# Patient Record
Sex: Male | Born: 1960 | Race: White | Hispanic: No | Marital: Married | Smoking: Former smoker
Health system: Southern US, Community
[De-identification: ages and names within clinical notes are randomized; demographics above are authoritative.]

## PROBLEM LIST (undated history)

## (undated) DIAGNOSIS — F329 Major depressive disorder, single episode, unspecified: Secondary | ICD-10-CM

## (undated) DIAGNOSIS — I4891 Unspecified atrial fibrillation: Secondary | ICD-10-CM

## (undated) DIAGNOSIS — F419 Anxiety disorder, unspecified: Secondary | ICD-10-CM

## (undated) DIAGNOSIS — I10 Essential (primary) hypertension: Secondary | ICD-10-CM

## (undated) DIAGNOSIS — F32A Depression, unspecified: Secondary | ICD-10-CM

## (undated) DIAGNOSIS — I480 Paroxysmal atrial fibrillation: Secondary | ICD-10-CM

## (undated) HISTORY — DX: Paroxysmal atrial fibrillation: I48.0

## (undated) HISTORY — PX: WISDOM TOOTH EXTRACTION: SHX21

## (undated) HISTORY — DX: Depression, unspecified: F32.A

## (undated) HISTORY — DX: Major depressive disorder, single episode, unspecified: F32.9

## (undated) HISTORY — PX: CARDIAC ELECTROPHYSIOLOGY MAPPING AND ABLATION: SHX1292

## (undated) HISTORY — PX: TONSILLECTOMY: SUR1361

## (undated) HISTORY — DX: Anxiety disorder, unspecified: F41.9

## (undated) HISTORY — PX: CARPAL TUNNEL RELEASE: SHX101

## (undated) HISTORY — PX: ULNAR NERVE REPAIR: SHX2594

---

## 2001-08-17 ENCOUNTER — Emergency Department (HOSPITAL_COMMUNITY): Admission: EM | Admit: 2001-08-17 | Discharge: 2001-08-18 | Payer: Self-pay | Admitting: Emergency Medicine

## 2009-04-26 ENCOUNTER — Emergency Department (HOSPITAL_COMMUNITY): Admission: EM | Admit: 2009-04-26 | Discharge: 2009-04-26 | Payer: Self-pay | Admitting: Emergency Medicine

## 2011-11-26 HISTORY — PX: COLONOSCOPY: SHX174

## 2012-05-21 ENCOUNTER — Other Ambulatory Visit: Payer: Self-pay | Admitting: Orthopedic Surgery

## 2012-05-25 NOTE — Discharge Instructions (Signed)

## 2012-05-25 NOTE — H&P (Signed)
  Jason Fleming is an 51 y.o. male.   Chief Complaint: c/o cystic mass left palm. 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  He presents for evaluation of a 1.5 mm mass in his left palm which has the appearance of an epidermal inclusion cyst.   No past medical history on file.  No past surgical history on file.  No family history on file. Social History:  does not have a smoking history on file. He does not have any smokeless tobacco history on file. His alcohol and drug histories not on file.  Allergies: Allergies not on file  No prescriptions prior to admission    No results found for this or any previous visit (from the past 48 hour(s)).  No results found.   Pertinent items are noted in HPI.  There were no vitals taken for this visit.  General appearance: alert Head: Normocephalic, without obvious abnormality Neck: supple, symmetrical, trachea midline Resp: clear to auscultation bilaterally Cardio: regular rate and rhythm Extremities: Inspection of his hand reveals a 1.5 mm mass consistent with an epidermal inclusion cyst. This is somewhat tender. He has full ROM of his fingers in flexion/extension. There is no sign of STS. There is no evidence of recent trauma. His surgical incisions are well healed.   Pulses: 2+ and symmetric Skin: normal Neurologic: Grossly normal    Assessment/Plan Assessment: Probable epidermal inclusion cyst left palm.  Plan: We will schedule excision under local anesthesia at a mutually convenient time. The surgery, after care, risks and benefits were described in detail. Questions regarding the surgery were invited and answered in detail.   Jason Fleming,Jason Fleming J 05/25/2012, 9:01 PM    H&P documentation: 05/26/2012  -History and Physical Reviewed  -Patient has been re-examined  -No change in the plan of care  Wyn Forster, MD

## 2012-05-26 ENCOUNTER — Encounter (HOSPITAL_BASED_OUTPATIENT_CLINIC_OR_DEPARTMENT_OTHER): Payer: Self-pay | Admitting: Orthopedic Surgery

## 2012-05-26 ENCOUNTER — Ambulatory Visit (HOSPITAL_BASED_OUTPATIENT_CLINIC_OR_DEPARTMENT_OTHER)
Admission: RE | Admit: 2012-05-26 | Discharge: 2012-05-26 | Disposition: A | Discharge status: Home / Self Care | Payer: 59 | Source: Ambulatory Visit | Attending: Orthopedic Surgery | Admitting: Orthopedic Surgery

## 2012-05-26 ENCOUNTER — Encounter (HOSPITAL_BASED_OUTPATIENT_CLINIC_OR_DEPARTMENT_OTHER)
Admission: RE | Disposition: A | Discharge status: Home / Self Care | Payer: Self-pay | Source: Ambulatory Visit | Attending: Orthopedic Surgery

## 2012-05-26 DIAGNOSIS — L723 Sebaceous cyst: Secondary | ICD-10-CM | POA: Insufficient documentation

## 2012-05-26 HISTORY — PX: MASS EXCISION: SHX2000

## 2012-05-26 SURGERY — MINOR EXCISION OF MASS
Anesthesia: LOCAL | Site: Hand | Laterality: Left | Wound class: Clean

## 2012-05-26 MED ORDER — LIDOCAINE HCL 2 % IJ SOLN
INTRAMUSCULAR | Status: DC | PRN
  Administered 2012-05-26: 5 mL

## 2012-05-26 MED ORDER — HYDROCODONE-ACETAMINOPHEN 5-325 MG PO TABS: ORAL_TABLET | ORAL | Status: AC

## 2012-05-26 MED ORDER — CHLORHEXIDINE GLUCONATE 4 % EX LIQD: 60.0000 mL | Freq: Once | CUTANEOUS | Status: DC

## 2012-05-26 SURGICAL SUPPLY — 38 items
BANDAGE ADHESIVE 1X3 (GAUZE/BANDAGES/DRESSINGS) IMPLANT
BLADE SURG 15 STRL LF DISP TIS (BLADE) ×1 IMPLANT
BLADE SURG 15 STRL SS (BLADE) ×2
BNDG CMPR 9X4 STRL LF SNTH (GAUZE/BANDAGES/DRESSINGS)
BNDG CMPR MD 5X2 ELC HKLP STRL (GAUZE/BANDAGES/DRESSINGS)
BNDG COHESIVE 1X5 TAN STRL LF (GAUZE/BANDAGES/DRESSINGS) ×1 IMPLANT
BNDG ELASTIC 2 VLCR STRL LF (GAUZE/BANDAGES/DRESSINGS) IMPLANT
BNDG ESMARK 4X9 LF (GAUZE/BANDAGES/DRESSINGS) IMPLANT
BRUSH SCRUB EZ PLAIN DRY (MISCELLANEOUS) ×2 IMPLANT
CLOTH BEACON ORANGE TIMEOUT ST (SAFETY) ×2 IMPLANT
CORDS BIPOLAR (ELECTRODE) IMPLANT
COVER MAYO STAND STRL (DRAPES) ×2 IMPLANT
CUFF TOURNIQUET SINGLE 18IN (TOURNIQUET CUFF) ×1 IMPLANT
DECANTER SPIKE VIAL GLASS SM (MISCELLANEOUS) IMPLANT
DRAIN PENROSE 1/2X12 LTX STRL (WOUND CARE) IMPLANT
DRAPE SURG 17X23 STRL (DRAPES) ×1 IMPLANT
GAUZE SPONGE 4X4 12PLY STRL LF (GAUZE/BANDAGES/DRESSINGS) ×2 IMPLANT
GAUZE XEROFORM 1X8 LF (GAUZE/BANDAGES/DRESSINGS) ×1 IMPLANT
GLOVE BIO SURGEON STRL SZ7 (GLOVE) ×1 IMPLANT
GLOVE BIOGEL M STRL SZ7.5 (GLOVE) ×2 IMPLANT
GLOVE ORTHO TXT STRL SZ7.5 (GLOVE) ×2 IMPLANT
GOWN PREVENTION PLUS XLARGE (GOWN DISPOSABLE) ×2 IMPLANT
NEEDLE 27GAX1X1/2 (NEEDLE) ×2 IMPLANT
PACK BASIN DAY SURGERY FS (CUSTOM PROCEDURE TRAY) ×2 IMPLANT
PADDING CAST ABS 4INX4YD NS (CAST SUPPLIES)
PADDING CAST ABS COTTON 4X4 ST (CAST SUPPLIES) ×1 IMPLANT
SPONGE GAUZE 4X4 12PLY (GAUZE/BANDAGES/DRESSINGS) ×1 IMPLANT
STOCKINETTE 4X48 STRL (DRAPES) ×2 IMPLANT
SUT ETHILON 5 0 P 3 18 (SUTURE)
SUT NYLON ETHILON 5-0 P-3 1X18 (SUTURE) ×1 IMPLANT
SUT PROLENE 3 0 PS 2 (SUTURE) ×1 IMPLANT
SYR 3ML 23GX1 SAFETY (SYRINGE) IMPLANT
SYR BULB 3OZ (MISCELLANEOUS) ×1 IMPLANT
SYR CONTROL 10ML LL (SYRINGE) ×2 IMPLANT
TOWEL OR 17X24 6PK STRL BLUE (TOWEL DISPOSABLE) ×3 IMPLANT
TRAY DSU PREP LF (CUSTOM PROCEDURE TRAY) ×2 IMPLANT
UNDERPAD 30X30 INCONTINENT (UNDERPADS AND DIAPERS) ×2 IMPLANT
WATER STERILE IRR 1000ML POUR (IV SOLUTION) ×1 IMPLANT

## 2012-05-26 NOTE — Op Note (Signed)
[id number] 

## 2012-05-26 NOTE — Brief Op Note (Signed)
05/26/2012  12:09 PM  PATIENT:  Jason Fleming  51 y.o. male  PRE-OPERATIVE DIAGNOSIS:  mass left palm  POST-OPERATIVE DIAGNOSIS:  epidermal inclusion cyst  PROCEDURE: EXCISION OF EPIDERMAL INCLUSION CYST LEFT PALM  SURGEON: Wyn Forster., MD   PHYSICIAN ASSISTANT:   ASSISTANTS:Tremayne Sheldon Dasnoit,P.A-C   ANESTHESIA:   local  EBL:     BLOOD ADMINISTERED:none  DRAINS: none   LOCAL MEDICATIONS USED:  XYLOCAINE   SPECIMEN:  No Specimen  DISPOSITION OF SPECIMEN:  N/A  COUNTS:  YES  TOURNIQUET:   Total Tourniquet Time Documented: Upper Arm (Left) - 10 minutes  DICTATION: .Other Dictation: Dictation Number [id number]  PLAN OF CARE: Discharge to home after PACU  PATIENT DISPOSITION:  PACU - hemodynamically stable.

## 2012-05-27 NOTE — Op Note (Signed)
[name], [name]                ACCOUNT NO.:  [id number]  MEDICAL RECORD NO.:  [id number]  LOCATION:                                 FACILITY:  PHYSICIAN:  Katy Fitch. Raydon Chappuis, M.D. DATE OF BIRTH:  1960-12-19  DATE OF PROCEDURE:  05/26/2012 DATE OF DISCHARGE:                              OPERATIVE REPORT   PREOPERATIVE DIAGNOSIS:  Enlarging mass, left proximal palm, consistent with a probable epidermal inclusion cyst.  POSTOPERATIVE DIAGNOSIS:  Enlarging mass, left proximal palm, consistent with a probable epidermal inclusion cyst; confirming the presence of an epidermal inclusion cyst.  OPERATION:  Resection of subcutaneous epidermal inclusion cyst.  OPERATING SURGEON:  Katy Fitch. Takayla Baillie, MD.  ASSISTANT:  Marveen Reeks. Dasnoit, PA-C.  ANESTHESIA:  Lidocaine 2% palmar block.  This was performed as a minor operating room procedure.  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  He has had an enlarging mass in his palm for more than a year.  This is not particularly tender, but it is quite sizable, approaching 2 cm in diameter.  He presented for evaluation and requested removal.  We advised him this was likely an epidermal inclusion cyst.  Epidermal inclusion cysts can recur.  It is possible to have recurrence with little as one epidermal cell remaining.  We advised him that we perform marginal resection with through irrigation of his wound.  After informed consent, he was brought to the operating room at this time.  PROCEDURE:  Jason Fleming was brought to room 1 of the [facility] and placed in supine position on the operating table.  Following Betadine prep of his palm and informed consent, 2% lidocaine was infiltrated deep to the cyst and in the region of the cutaneous nerves serving the palm.  After 10 minutes, excellent anesthesia was achieved.  The left hand and arm were then prepped with  Betadine soap and solution, sterilely draped. A pneumatic tourniquet was applied to proximal left brachium.  Following exsanguination of the left arm by direct compression, the arterial tourniquet was inflated to 220 mmHg.  Following routine surgical time- out, we performed an elliptical resection of expanded skin overlying the cyst.  We then performed a meticulous dissection surrounding the membrane of the epidermal cyst.  There was a small area of leakage where the preoperative anesthetic 27-gauge needle had likely pierced deep portion of the cyst.  This was meticulously dissected with the margin of normal subcutaneous fat.  After the cyst was excised in toto, it was passed off, but will not be sent to Pathology as this is a classic epidermal inclusion cyst.  The wound was then thoroughly lavaged with sterile saline, followed by repair of the skin with intradermal 3-0 Prolene suture.  A compressive dressings was applied with a Xeroflo directly on the wound followed by sterile gauze and Ace wrap.  We advised [name] to remove his dressing in 3 days and begin using Band-Aids or other bandages at home.  We will see him back for follow up in 1 week for suture removal or sooner p.r.n. problems.     Katy Fitch Jakelyn Squyres, M.D.  RVS/MEDQ  D:  05/26/2012  T:  05/26/2012  Job:  [id number]

## 2012-05-29 ENCOUNTER — Encounter (HOSPITAL_BASED_OUTPATIENT_CLINIC_OR_DEPARTMENT_OTHER): Payer: Self-pay | Admitting: Orthopedic Surgery

## 2012-07-02 ENCOUNTER — Emergency Department (HOSPITAL_COMMUNITY)
Admission: EM | Admit: 2012-07-02 | Discharge: 2012-07-02 | Disposition: A | Discharge status: Home / Self Care | Payer: 59 | Attending: Emergency Medicine | Admitting: Emergency Medicine

## 2012-07-02 ENCOUNTER — Encounter (HOSPITAL_COMMUNITY): Payer: Self-pay | Admitting: Vascular Surgery

## 2012-07-02 ENCOUNTER — Other Ambulatory Visit: Payer: Self-pay

## 2012-07-02 DIAGNOSIS — I10 Essential (primary) hypertension: Secondary | ICD-10-CM | POA: Insufficient documentation

## 2012-07-02 DIAGNOSIS — F410 Panic disorder [episodic paroxysmal anxiety] without agoraphobia: Secondary | ICD-10-CM | POA: Insufficient documentation

## 2012-07-02 DIAGNOSIS — Z87891 Personal history of nicotine dependence: Secondary | ICD-10-CM | POA: Insufficient documentation

## 2012-07-02 HISTORY — DX: Essential (primary) hypertension: I10

## 2012-07-02 LAB — POCT I-STAT, CHEM 8
BUN: 8 mg/dL (ref 6–23)
Calcium, Ion: 1.25 mmol/L — ABNORMAL HIGH (ref 1.12–1.23)
Creatinine, Ser: 1 mg/dL (ref 0.50–1.35)
Hemoglobin: 14.3 g/dL (ref 13.0–17.0)
TCO2: 25 mmol/L (ref 0–100)

## 2012-07-02 MED ORDER — ALPRAZOLAM 0.5 MG PO TABS: 0.5000 mg | ORAL_TABLET | Freq: Every evening | ORAL | Status: AC | PRN

## 2012-07-02 NOTE — ED Notes (Signed)
MD at bedside. 

## 2012-07-02 NOTE — ED Notes (Signed)
Pt verbalizes understanding 

## 2012-07-02 NOTE — ED Provider Notes (Signed)
History     CSN: [id number]  Arrival date & time 07/02/12  1438   First MD Initiated Contact with Patient 07/02/12 1605      Chief Complaint  Patient presents with  . Tingling    (Consider location/radiation/quality/duration/timing/severity/associated sxs/prior treatment) HPI Pt with history of anxiety and had been taking zoloft but stopped in march because he was feeling better. Pt state he has had increased stress at work and developed panic attack around 1300 today characterized by feeling hot, hyperventilation, and tingling over entire body. No CP or SOB. Pt took xanax and symptoms now have abated completely. Pt took last xanax. No PE risk factors. BP noted to be elevated at presentation Past Medical History  Diagnosis Date  . Hypertension     Past Surgical History  Procedure Date  . Mass excision 05/26/2012    Procedure: MINOR EXCISION OF MASS;  Surgeon: Wyn Forster., MD;  Location: [facility];  Service: Orthopedics;  Laterality: Left;  . Tonsillectomy   . Carpal tunnel release   . Wisdom tooth extraction     History reviewed. No pertinent family history.  History  Substance Use Topics  . Smoking status: Former Smoker    Types: Cigarettes    Quit date: 03/02/2012  . Smokeless tobacco: Not on file  . Alcohol Use: No      Review of Systems  Constitutional: Positive for diaphoresis. Negative for fever and chills.  HENT: Negative for neck pain and neck stiffness.   Respiratory: Negative for cough, shortness of breath and wheezing.   Cardiovascular: Positive for palpitations. Negative for chest pain and leg swelling.  Gastrointestinal: Negative for nausea and vomiting.  Skin: Negative for rash.  Neurological: Positive for dizziness and light-headedness. Negative for seizures, weakness and headaches.  Psychiatric/Behavioral: Negative for dysphoric mood. The patient is nervous/anxious.     Allergies  Review of patient's allergies indicates no  known allergies.  Home Medications   Current Outpatient Rx  Name Route Sig Dispense Refill  . ALPRAZOLAM 0.5 MG PO TABS Oral Take 0.5 mg by mouth 3 (three) times daily as needed. For anxiety.    . ASPIRIN EC 81 MG PO TBEC Oral Take 81 mg by mouth daily.    . OMEGA-3 FATTY ACIDS 1000 MG PO CAPS Oral Take 3 g by mouth 2 (two) times daily.     . ADULT MULTIVITAMIN W/MINERALS CH Oral Take 1 tablet by mouth daily.    . SERTRALINE HCL 50 MG PO TABS Oral Take 50 mg by mouth daily.    Marland Kitchen ALPRAZOLAM 0.5 MG PO TABS Oral Take 1 tablet (0.5 mg total) by mouth at bedtime as needed for sleep. 15 tablet 0    BP 129/92  Pulse 53  Temp 99.1 F (37.3 C) (Oral)  Resp 16  Ht 6\' 2"  (1.88 m)  Wt 240 lb (108.863 kg)  BMI 30.81 kg/m2  SpO2 98%  Physical Exam  Nursing note and vitals reviewed. Constitutional: He is oriented to person, place, and time. He appears well-developed and well-nourished. No distress.  HENT:  Head: Normocephalic and atraumatic.  Mouth/Throat: Oropharynx is clear and moist.  Eyes: EOM are normal. Pupils are equal, round, and reactive to light.  Neck: Normal range of motion. Neck supple.  Cardiovascular: Normal rate and regular rhythm.  Exam reveals no gallop and no friction rub.   No murmur heard. Pulmonary/Chest: Effort normal and breath sounds normal. No respiratory distress. He has no wheezes. He has no  rales. He exhibits no tenderness.  Abdominal: Soft. Bowel sounds are normal. He exhibits no distension and no mass. There is no tenderness. There is no rebound and no guarding.  Musculoskeletal: Normal range of motion. He exhibits no edema and no tenderness.       No calf tenderness or swelling  Neurological: He is alert and oriented to person, place, and time.       5/5 motor, sensation intact  Skin: Skin is warm and dry. No rash noted. No erythema.  Psychiatric: He has a normal mood and affect. His behavior is normal.    ED Course  Procedures (including critical care  time)  Labs Reviewed  POCT I-STAT, CHEM 8 - Abnormal; Notable for the following:    Calcium, Ion 1.25 (*)     All other components within normal limits  POCT I-STAT TROPONIN I  LAB REPORT - SCANNED   No results found.   1. Panic attack      Date: 07/02/2012  Rate: 97  Rhythm: normal sinus rhythm  QRS Axis: normal  Intervals: normal  ST/T Wave abnormalities: normal  Conduction Disutrbances:none  Narrative Interpretation:   Old EKG Reviewed: none available    MDM          Loren Racer, MD 07/13/12 [phone]

## 2012-07-02 NOTE — ED Notes (Addendum)
Pt reports tingling in the facial region 2weeks with worsening over the past 3days.  Denies h/a, blurred vision, chest pain, or SOB.  Pt has hx of anxiety and reports having this problem before due to anxiety, but it wasn't this severe. No hx of cardiac issues.

## 2012-07-02 NOTE — ED Notes (Signed)
EKG done in triage upon pt arrival

## 2012-07-02 NOTE — ED Notes (Signed)
Pt reports he is feeling tingling in his face, ears, top of his head, and his arms and legs. States that he has had panic attacks so this may be what is happening but he has never had this type of tingling associated with the attacks.

## 2013-02-10 ENCOUNTER — Telehealth: Payer: Self-pay | Admitting: Family Medicine

## 2013-02-10 MED ORDER — SERTRALINE HCL 50 MG PO TABS: 50.0000 mg | ORAL_TABLET | Freq: Every day | ORAL | Status: DC

## 2013-02-10 NOTE — Telephone Encounter (Signed)
Medication refilled per protocol.Patient needs to be seen before any further refills 

## 2013-03-10 ENCOUNTER — Telehealth: Payer: Self-pay | Admitting: Family Medicine

## 2013-03-10 MED ORDER — SERTRALINE HCL 50 MG PO TABS: ORAL_TABLET | ORAL | Status: DC

## 2013-03-10 NOTE — Telephone Encounter (Signed)
Rx Refilled  

## 2013-05-11 ENCOUNTER — Other Ambulatory Visit: Payer: Self-pay | Admitting: Family Medicine

## 2013-05-11 ENCOUNTER — Ambulatory Visit (INDEPENDENT_AMBULATORY_CARE_PROVIDER_SITE_OTHER): Payer: 59 | Admitting: Family Medicine

## 2013-05-11 ENCOUNTER — Encounter: Payer: Self-pay | Admitting: Family Medicine

## 2013-05-11 VITALS — BP 110/80 | HR 78 | Temp 98.1°F | Resp 18 | Wt 256.0 lb

## 2013-05-11 DIAGNOSIS — F419 Anxiety disorder, unspecified: Secondary | ICD-10-CM | POA: Insufficient documentation

## 2013-05-11 DIAGNOSIS — Z13220 Encounter for screening for lipoid disorders: Secondary | ICD-10-CM

## 2013-05-11 DIAGNOSIS — Z125 Encounter for screening for malignant neoplasm of prostate: Secondary | ICD-10-CM

## 2013-05-11 DIAGNOSIS — F329 Major depressive disorder, single episode, unspecified: Secondary | ICD-10-CM

## 2013-05-11 DIAGNOSIS — F32A Depression, unspecified: Secondary | ICD-10-CM | POA: Insufficient documentation

## 2013-05-11 MED ORDER — SERTRALINE HCL 50 MG PO TABS: 100.0000 mg | ORAL_TABLET | Freq: Every day | ORAL | Status: DC

## 2013-05-11 NOTE — Telephone Encounter (Signed)
Med refilled.

## 2013-05-11 NOTE — Progress Notes (Signed)
  Subjective:    Patient ID: Jason Fleming, male    DOB: 04-30-1961, 52 y.o.   MRN: [id number]  HPI  Patient is here for followup of his depression and anxiety. He is currently taking Zoloft 100 mg by mouth daily. He states he has never felt better. His anxiety is well controlled. He denies any panic attacks. He denies any depression. He denies any anhedonia or suicidal thoughts. His blood pressure is currently well controlled. He would like to continue the Zoloft. He tried to stop the medicine twice in the past and had resumed both occasions due to increasing depression and anxiety.  His colonoscopy is up to date. He is due for a prostate exam but he declines the rectal exam. Past Medical History  Diagnosis Date  . Hypertension   . Depression   . Anxiety    Current Outpatient Prescriptions on File Prior to Visit  Medication Sig Dispense Refill  . fish oil-omega-3 fatty acids 1000 MG capsule Take 3 g by mouth 2 (two) times daily.       . Multiple Vitamin (MULTIVITAMIN WITH MINERALS) TABS Take 1 tablet by mouth daily.      Marland Kitchen aspirin EC 81 MG tablet Take 81 mg by mouth daily.       No current facility-administered medications on file prior to visit.   No Known Allergies History   Social History  . Marital Status: Married    Spouse Name: N/A    Number of Children: N/A  . Years of Education: N/A   Occupational History  . Not on file.   Social History Main Topics  . Smoking status: Former Smoker    Types: Cigarettes    Quit date: 03/02/2012  . Smokeless tobacco: Not on file  . Alcohol Use: No  . Drug Use: No  . Sexually Active:    Other Topics Concern  . Not on file   Social History Narrative  . No narrative on file     Review of Systems  All other systems reviewed and are negative.       Objective:   Physical Exam  Vitals reviewed. Neck: Neck supple. No thyromegaly present.  Cardiovascular: Normal rate, regular rhythm, normal heart sounds and intact distal  pulses.   No murmur heard. Pulmonary/Chest: Effort normal and breath sounds normal. No respiratory distress. He has no wheezes. He has no rales.  Abdominal: Soft. Bowel sounds are normal. He exhibits no distension. There is no tenderness. There is no rebound and no guarding.  Lymphadenopathy:    He has no cervical adenopathy.  Psychiatric: He has a normal mood and affect. His behavior is normal. Judgment and thought content normal.          Assessment & Plan:  1. Depression Continue Zoloft 100 mg by mouth daily.. - sertraline (ZOLOFT) 50 MG tablet; Take 2 tablets (100 mg total) by mouth daily.  Dispense: 60 tablet; Refill: 11  2. Screening for cholesterol level Return fasting for a CMP CBC and fasting lipid panel. Your LDL is less than 130 - CBC with Differential; Future - COMPLETE METABOLIC PANEL WITH GFR; Future - Lipid panel; Future - PSA; Future  3. Screening for prostate cancer Return for a PSA.

## 2014-05-19 ENCOUNTER — Other Ambulatory Visit: Payer: Self-pay | Admitting: Family Medicine

## 2014-05-19 NOTE — Telephone Encounter (Signed)
Medication filled x1 with no refills.   Requires office visit before any further refills can be given.   Letter sent.  

## 2014-07-19 ENCOUNTER — Other Ambulatory Visit: Payer: Self-pay | Admitting: Family Medicine

## 2014-07-19 ENCOUNTER — Encounter: Payer: Self-pay | Admitting: Family Medicine

## 2014-07-19 ENCOUNTER — Ambulatory Visit (INDEPENDENT_AMBULATORY_CARE_PROVIDER_SITE_OTHER): Payer: BC Managed Care – PPO | Admitting: Family Medicine

## 2014-07-19 VITALS — BP 102/64 | HR 60 | Temp 98.3°F | Resp 16 | Ht 74.0 in | Wt 238.0 lb

## 2014-07-19 DIAGNOSIS — Z125 Encounter for screening for malignant neoplasm of prostate: Secondary | ICD-10-CM

## 2014-07-19 DIAGNOSIS — Z13220 Encounter for screening for lipoid disorders: Secondary | ICD-10-CM

## 2014-07-19 DIAGNOSIS — F411 Generalized anxiety disorder: Secondary | ICD-10-CM

## 2014-07-19 LAB — COMPLETE METABOLIC PANEL WITH GFR
ALT: 19 U/L (ref 0–53)
AST: 19 U/L (ref 0–37)
Albumin: 4.7 g/dL (ref 3.5–5.2)
Alkaline Phosphatase: 51 U/L (ref 39–117)
BUN: 20 mg/dL (ref 6–23)
CALCIUM: 9 mg/dL (ref 8.4–10.5)
CHLORIDE: 105 meq/L (ref 96–112)
CO2: 25 meq/L (ref 19–32)
Creat: 1.19 mg/dL (ref 0.50–1.35)
GFR, EST AFRICAN AMERICAN: 81 mL/min
GFR, Est Non African American: 70 mL/min
Glucose, Bld: 93 mg/dL (ref 70–99)
POTASSIUM: 4.3 meq/L (ref 3.5–5.3)
SODIUM: 140 meq/L (ref 135–145)
TOTAL PROTEIN: 7 g/dL (ref 6.0–8.3)
Total Bilirubin: 0.5 mg/dL (ref 0.2–1.2)

## 2014-07-19 LAB — LIPID PANEL
Cholesterol: 200 mg/dL (ref 0–200)
HDL: 46 mg/dL (ref >39)
LDL CALC: 123 mg/dL — AB (ref 0–99)
Total CHOL/HDL Ratio: 4.3 Ratio
Triglycerides: 156 mg/dL — ABNORMAL HIGH (ref <150)
VLDL: 31 mg/dL (ref 0–40)

## 2014-07-19 LAB — CBC WITH DIFFERENTIAL/PLATELET
BASOS ABS: 0.1 10*3/uL (ref 0.0–0.1)
Basophils Relative: 2 % — ABNORMAL HIGH (ref 0–1)
Eosinophils Absolute: 0.3 10*3/uL (ref 0.0–0.7)
Eosinophils Relative: 5 % (ref 0–5)
HCT: 36.9 % — ABNORMAL LOW (ref 39.0–52.0)
Hemoglobin: 12.6 g/dL — ABNORMAL LOW (ref 13.0–17.0)
LYMPHS ABS: 2.1 10*3/uL (ref 0.7–4.0)
LYMPHS PCT: 41 % (ref 12–46)
MCH: 27.8 pg (ref 26.0–34.0)
MCHC: 34.1 g/dL (ref 30.0–36.0)
MCV: 81.3 fL (ref 78.0–100.0)
Monocytes Absolute: 0.4 10*3/uL (ref 0.1–1.0)
Monocytes Relative: 8 % (ref 3–12)
NEUTROS ABS: 2.3 10*3/uL (ref 1.7–7.7)
NEUTROS PCT: 44 % (ref 43–77)
PLATELETS: 198 10*3/uL (ref 150–400)
RBC: 4.54 MIL/uL (ref 4.22–5.81)
RDW: 14.9 % (ref 11.5–15.5)
WBC: 5.2 10*3/uL (ref 4.0–10.5)

## 2014-07-19 MED ORDER — VENLAFAXINE HCL ER 75 MG PO CP24: 150.0000 mg | ORAL_CAPSULE | Freq: Every day | ORAL | Status: DC

## 2014-07-19 NOTE — Progress Notes (Signed)
   Subjective:    Patient ID: Jason Fleming, male    DOB: Oct 31, 1961, 53 y.o.   MRN: [id number]  HPI Patient is here today for generalized anxiety disorder. He is currently taking 100 mg Zoloft every night. The medication helps but he still has breakthrough anxiety attacks almost on a daily basis. They're not full-blown panic attacks but they are periods with moderate unprovoked anxiety.  He believes he may becoming tolerant to the medicine. He is also overdue for fasting lab work. He declines digital rectal exam today but he would consent for a PSA. Past Medical History  Diagnosis Date  . Hypertension   . Depression   . Anxiety    Past Surgical History  Procedure Laterality Date  . Mass excision  05/26/2012    Procedure: MINOR EXCISION OF MASS;  Surgeon: Cammie Sickle., MD;  Location: [facility];  Service: Orthopedics;  Laterality: Left;  . Tonsillectomy    . Carpal tunnel release    . Wisdom tooth extraction     Current Outpatient Prescriptions on File Prior to Visit  Medication Sig Dispense Refill  . aspirin EC 81 MG tablet Take 81 mg by mouth daily.      . fish oil-omega-3 fatty acids 1000 MG capsule Take 3 g by mouth 2 (two) times daily.       . Multiple Vitamin (MULTIVITAMIN WITH MINERALS) TABS Take 1 tablet by mouth daily.      . sertraline (ZOLOFT) 50 MG tablet TAKE 2 TABLETS (100 MG TOTAL) BY MOUTH DAILY.  60 tablet  0   No current facility-administered medications on file prior to visit.   No Known Allergies    Review of Systems  All other systems reviewed and are negative.      Objective:   Physical Exam  Neck: No JVD present. No thyromegaly present.  Cardiovascular: Normal rate, regular rhythm and normal heart sounds.   No murmur heard. Pulmonary/Chest: Effort normal and breath sounds normal. No respiratory distress. He has no wheezes. He has no rales.  Abdominal: Soft. Bowel sounds are normal. He exhibits no distension. There is no  tenderness. There is no rebound and no guarding.  Lymphadenopathy:    He has no cervical adenopathy.          Assessment & Plan:  GAD (generalized anxiety disorder) - Plan: venlafaxine XR (EFFEXOR-XR) 75 MG 24 hr capsule  Screening cholesterol level - Plan: CBC with Differential, COMPLETE METABOLIC PANEL WITH GFR, Lipid panel  Prostate cancer screening - Plan: PSA   Decrease Zoloft to 50 mg by mouth daily for 2 weeks. Begin Effexor XR 70 mg by mouth every morning for 2 weeks. After 2 weeks discontinue Zoloft and increase Effexor to 150 mg by mouth every morning. Recheck in one month. Blood pressure is excellent. I will schedule fasting lab work. Also check a PSA.

## 2014-07-20 LAB — PSA: PSA: 0.34 ng/mL (ref <=4.00)

## 2014-07-22 LAB — VITAMIN B12: VITAMIN B 12: 499 pg/mL (ref 211–911)

## 2014-07-22 LAB — IRON: Iron: 86 ug/dL (ref 42–165)

## 2014-07-23 ENCOUNTER — Other Ambulatory Visit: Payer: Self-pay | Admitting: *Deleted

## 2014-07-23 DIAGNOSIS — D649 Anemia, unspecified: Secondary | ICD-10-CM

## 2015-02-02 ENCOUNTER — Emergency Department (HOSPITAL_COMMUNITY): Payer: BLUE CROSS/BLUE SHIELD

## 2015-02-02 ENCOUNTER — Encounter (HOSPITAL_COMMUNITY): Payer: Self-pay | Admitting: Emergency Medicine

## 2015-02-02 ENCOUNTER — Emergency Department (HOSPITAL_COMMUNITY)
Admission: EM | Admit: 2015-02-02 | Discharge: 2015-02-02 | Disposition: A | Discharge status: Home / Self Care | Payer: BLUE CROSS/BLUE SHIELD | Attending: Emergency Medicine | Admitting: Emergency Medicine

## 2015-02-02 DIAGNOSIS — S56521A Laceration of other extensor muscle, fascia and tendon at forearm level, right arm, initial encounter: Secondary | ICD-10-CM | POA: Insufficient documentation

## 2015-02-02 DIAGNOSIS — Y288XXA Contact with other sharp object, undetermined intent, initial encounter: Secondary | ICD-10-CM | POA: Insufficient documentation

## 2015-02-02 DIAGNOSIS — Y990 Civilian activity done for income or pay: Secondary | ICD-10-CM | POA: Diagnosis not present

## 2015-02-02 DIAGNOSIS — Z23 Encounter for immunization: Secondary | ICD-10-CM | POA: Diagnosis not present

## 2015-02-02 DIAGNOSIS — S66821A Laceration of other specified muscles, fascia and tendons at wrist and hand level, right hand, initial encounter: Secondary | ICD-10-CM

## 2015-02-02 DIAGNOSIS — Y9389 Activity, other specified: Secondary | ICD-10-CM | POA: Diagnosis not present

## 2015-02-02 DIAGNOSIS — S6991XA Unspecified injury of right wrist, hand and finger(s), initial encounter: Secondary | ICD-10-CM | POA: Diagnosis present

## 2015-02-02 DIAGNOSIS — S61401A Unspecified open wound of right hand, initial encounter: Secondary | ICD-10-CM

## 2015-02-02 DIAGNOSIS — Z79899 Other long term (current) drug therapy: Secondary | ICD-10-CM | POA: Diagnosis not present

## 2015-02-02 DIAGNOSIS — I10 Essential (primary) hypertension: Secondary | ICD-10-CM | POA: Insufficient documentation

## 2015-02-02 DIAGNOSIS — Z87891 Personal history of nicotine dependence: Secondary | ICD-10-CM | POA: Insufficient documentation

## 2015-02-02 DIAGNOSIS — Z8659 Personal history of other mental and behavioral disorders: Secondary | ICD-10-CM | POA: Insufficient documentation

## 2015-02-02 DIAGNOSIS — Z7982 Long term (current) use of aspirin: Secondary | ICD-10-CM | POA: Diagnosis not present

## 2015-02-02 DIAGNOSIS — Y9289 Other specified places as the place of occurrence of the external cause: Secondary | ICD-10-CM | POA: Diagnosis not present

## 2015-02-02 MED ORDER — LIDOCAINE HCL (PF) 1 % IJ SOLN
INTRAMUSCULAR | Status: AC
  Administered 2015-02-02: 5 mL
  Filled 2015-02-02: qty 5

## 2015-02-02 MED ORDER — LIDOCAINE HCL (PF) 1 % IJ SOLN
INTRAMUSCULAR | Status: AC
  Administered 2015-02-02: 21:00:00
  Filled 2015-02-02: qty 5

## 2015-02-02 MED ORDER — LIDOCAINE HCL (PF) 1 % IJ SOLN
INTRAMUSCULAR | Status: AC
  Administered 2015-02-02: 22:00:00
  Filled 2015-02-02: qty 5

## 2015-02-02 MED ORDER — DOXYCYCLINE HYCLATE 100 MG PO TABS
100.0000 mg | ORAL_TABLET | Freq: Once | ORAL | Status: AC
  Administered 2015-02-02: 100 mg via ORAL
  Filled 2015-02-02: qty 1

## 2015-02-02 MED ORDER — DEXTROSE 5 % IV SOLN
1.0000 g | Freq: Once | INTRAVENOUS | Status: DC
  Filled 2015-02-02: qty 10

## 2015-02-02 MED ORDER — BUPIVACAINE-EPINEPHRINE (PF) 0.5% -1:200000 IJ SOLN
INTRAMUSCULAR | Status: AC
  Administered 2015-02-02: 21:00:00
  Filled 2015-02-02: qty 30

## 2015-02-02 MED ORDER — TETANUS-DIPHTH-ACELL PERTUSSIS 5-2.5-18.5 LF-MCG/0.5 IM SUSP
0.5000 mL | Freq: Once | INTRAMUSCULAR | Status: AC
  Administered 2015-02-02: 0.5 mL via INTRAMUSCULAR
  Filled 2015-02-02: qty 0.5

## 2015-02-02 MED ORDER — HYDROCODONE-ACETAMINOPHEN 5-325 MG PO TABS: 1.0000 | ORAL_TABLET | ORAL | Status: DC | PRN

## 2015-02-02 MED ORDER — DOXYCYCLINE HYCLATE 100 MG PO CAPS: 100.0000 mg | ORAL_CAPSULE | Freq: Two times a day (BID) | ORAL | Status: DC

## 2015-02-02 MED ORDER — CEFTRIAXONE SODIUM 1 G IJ SOLR
1.0000 g | Freq: Once | INTRAMUSCULAR | Status: AC
  Administered 2015-02-02: 1 g via INTRAMUSCULAR
  Filled 2015-02-02: qty 10

## 2015-02-02 NOTE — ED Notes (Signed)
Placed pt's rt hand in Normal saline/betadine soak.

## 2015-02-02 NOTE — ED Notes (Signed)
Pt has laceration to the rt hand by metal, bleeding controlled at this time.

## 2015-02-02 NOTE — ED Provider Notes (Signed)
CSN: [id number]     Arrival date & time 02/02/15  1826 History   First MD Initiated Contact with Patient 02/02/15 2008     Chief Complaint  Patient presents with  . Laceration     (Consider location/radiation/quality/duration/timing/severity/associated sxs/prior Treatment) Patient is a 54 y.o. male presenting with skin laceration. The history is provided by the patient.  Laceration Location:  Hand Hand laceration location:  R hand Depth:  Through muscle Quality: avulsion and jagged   Time since incident:  4 hours Laceration mechanism:  Metal edge Pain details:    Severity:  Moderate   Timing:  Constant   Progression:  Unchanged Foreign body present:  No foreign bodies Relieved by:  Pressure Worsened by:  Movement Tetanus status:  Unknown  Jason Fleming is a 54 y.o. male who presents to the ED with a laceration to the right hand. He states he was working on a car and polishing a piece of metal and his hand slipped and went under the skin of the dorsum of the right hand. He denies any other injuries. He is unsure of his last tetanus.   Past Medical History  Diagnosis Date  . Hypertension   . Depression   . Anxiety    Past Surgical History  Procedure Laterality Date  . Mass excision  05/26/2012    Procedure: MINOR EXCISION OF MASS;  Surgeon: Cammie Sickle., MD;  Location: [place];  Service: Orthopedics;  Laterality: Left;  . Tonsillectomy    . Carpal tunnel release    . Wisdom tooth extraction     No family history on file. History  Substance Use Topics  . Smoking status: Former Smoker    Types: Cigarettes    Quit date: 03/02/2012  . Smokeless tobacco: Not on file  . Alcohol Use: No    Review of Systems Negative except as stated in HPI   Allergies  Review of patient's allergies indicates no known allergies.  Home Medications   Prior to Admission medications   Medication Sig Start Date End Date Taking? Authorizing Provider  aspirin EC  81 MG tablet Take 81 mg by mouth daily.    Historical Provider, MD  doxycycline (VIBRAMYCIN) 100 MG capsule Take 1 capsule (100 mg total) by mouth 2 (two) times daily. 02/02/15   Hope Bunnie Pion, NP  fish oil-omega-3 fatty acids 1000 MG capsule Take 3 g by mouth 2 (two) times daily.     Historical Provider, MD  HYDROcodone-acetaminophen (NORCO/VICODIN) 5-325 MG per tablet Take 1 tablet by mouth every 4 (four) hours as needed. 02/02/15   Hope Bunnie Pion, NP  Multiple Vitamin (MULTIVITAMIN WITH MINERALS) TABS Take 1 tablet by mouth daily.    Historical Provider, MD  sertraline (ZOLOFT) 50 MG tablet TAKE 2 TABLETS (100 MG TOTAL) BY MOUTH DAILY. 05/19/14   Susy Frizzle, MD  venlafaxine XR (EFFEXOR-XR) 75 MG 24 hr capsule Take 2 capsules (150 mg total) by mouth daily with breakfast. 07/19/14   Susy Frizzle, MD   BP 137/87 mmHg  Pulse 80  Temp(Src) 98.8 F (37.1 C) (Oral)  Resp 20  Ht 6\' 2"  (1.88 m)  Wt 240 lb (108.863 kg)  BMI 30.80 kg/m2  SpO2 99% Physical Exam  Constitutional: He is oriented to person, place, and time. He appears well-developed and well-nourished.  Eyes: EOM are normal.  Neck: Neck supple.  Pulmonary/Chest: Effort normal.  Abdominal: Soft. There is no tenderness.  Musculoskeletal:  Right hand: He exhibits tenderness and laceration. He exhibits normal capillary refill. Decreased strength noted.       Hands: 8 cm flap laceration to the dorsum of the right hand. Laceration of extensor tendon. Difficulty with movement of the right 4th digit.  Arterial bleed.   Neurological: He is alert and oriented to person, place, and time. No cranial nerve deficit.  Skin: Skin is warm and dry.  Nursing note and vitals reviewed.   ED Course  LACERATION REPAIR Date/Time: 02/02/2015 10:50 PM Performed by: Ashley Murrain Authorized by: Ashley Murrain Consent: Verbal consent obtained. Risks and benefits: risks, benefits and alternatives were discussed Consent given by: patient Patient  understanding: patient states understanding of the procedure being performed Patient identity confirmed: verbally with patient Body area: upper extremity Location details: right hand Laceration length: 8 cm Contamination: The wound is contaminated. Foreign bodies: no foreign bodies Tendon involvement: complex Nerve involvement: none Vascular damage: no Anesthesia: local infiltration Local anesthetic: bupivacaine 0.25% with epinephrine Anesthetic total: 5 ml Patient sedated: no Preparation: Patient was prepped and draped in the usual sterile fashion. Irrigation solution: saline Irrigation method: syringe Amount of cleaning: extensive Debridement: none Degree of undermining: none Skin closure: 3-0 Prolene Subcutaneous closure: 4-0 Vicryl Number of sutures: 8 Approximation: loose Approximation difficulty: complex Dressing: splint and pressure dressing Patient tolerance: Patient tolerated the procedure well with no immediate complications Comments: Laceration of extensor tendon at [address]. Bleeding from artery closed by Dr. Wilson Singer.  Tendon will be repaired by Dr. Fredna Dow on call for hand surgery.   Consult with Dr. Fredna Dow patient to follow up in the morning.  Imaging Review Dg Hand Complete Right  02/02/2015   CLINICAL DATA:  Right hand laceration posteriorly today. Initial encounter.  EXAM: RIGHT HAND - COMPLETE 3+ VIEW  COMPARISON:  None.  FINDINGS: There is apparent chronic posttraumatic deformity of the distal third phalanx which is partially fragmented. No evidence of acute fracture or dislocation. There is a probable small metallic foreign body distally within the index finger. No other foreign bodies identified. There may be some soft tissue emphysema within the first web space and dorsum of the hand.  IMPRESSION: Possible soft tissue emphysema associated with reported laceration. No acute osseous findings demonstrated. Findings distally in the index and long fingers, presumably  nonacute.   Electronically Signed   By: Richardean Sale M.D.   On: 02/02/2015 19:37   Rocephin 1 gram IM Doxycycline 100 mg po now and Rx  Hydrocodone 5/325 Rx Splint  MDM  54 y.o. male with laceration of the right hand and bleeding. Stable for d/c to follow up with Dr. Fredna Dow for tendon laceration repair. He will call the office in the morning to schedule a follow time. I have reviewed this patient's vital signs, nurses notes, appropriate labs and imaging.  I have discussed findings and plan of care with the patient and he voices understanding and agrees with plan.   Final diagnoses:  Extensor tendon laceration, hand, open wound, right, initial encounter      [facility], NP 02/03/15 0018  Virgel Manifold, MD 02/06/15 [phone]

## 2015-02-02 NOTE — Discharge Instructions (Signed)
Call Dr. Levell July office in the morning and tell them you were evaluated in the ED and we spoke with Dr. Fredna Dow and he will see you for follow up of your lacerated tendon. Do not drive if you are taking the narcotic pain medication as it may make you sleepy. You can take ibuprofen in addition to the medications we give you.

## 2015-03-01 ENCOUNTER — Other Ambulatory Visit: Payer: Self-pay | Admitting: Family Medicine

## 2015-10-14 ENCOUNTER — Other Ambulatory Visit: Payer: Self-pay | Admitting: Family Medicine

## 2015-10-16 ENCOUNTER — Encounter: Payer: Self-pay | Admitting: Family Medicine

## 2015-12-03 ENCOUNTER — Other Ambulatory Visit: Payer: Self-pay | Admitting: Family Medicine

## 2017-01-01 ENCOUNTER — Other Ambulatory Visit: Payer: Self-pay | Admitting: Family Medicine

## 2017-10-02 ENCOUNTER — Other Ambulatory Visit: Payer: Self-pay | Admitting: Family Medicine

## 2017-10-02 MED ORDER — VENLAFAXINE HCL ER 75 MG PO CP24
ORAL_CAPSULE | ORAL | 3 refills | Status: DC
Start: 2017-10-02 — End: 2018-06-12

## 2018-06-09 ENCOUNTER — Other Ambulatory Visit: Payer: Self-pay

## 2018-06-09 ENCOUNTER — Encounter (HOSPITAL_COMMUNITY): Payer: Self-pay | Admitting: Emergency Medicine

## 2018-06-09 ENCOUNTER — Emergency Department (HOSPITAL_COMMUNITY): Payer: BLUE CROSS/BLUE SHIELD

## 2018-06-09 ENCOUNTER — Emergency Department (HOSPITAL_COMMUNITY)
Admission: EM | Admit: 2018-06-09 | Discharge: 2018-06-09 | Disposition: A | Discharge status: Home / Self Care | Payer: BLUE CROSS/BLUE SHIELD | Attending: Emergency Medicine | Admitting: Emergency Medicine

## 2018-06-09 DIAGNOSIS — I10 Essential (primary) hypertension: Secondary | ICD-10-CM | POA: Diagnosis not present

## 2018-06-09 DIAGNOSIS — F17290 Nicotine dependence, other tobacco product, uncomplicated: Secondary | ICD-10-CM | POA: Insufficient documentation

## 2018-06-09 DIAGNOSIS — I4891 Unspecified atrial fibrillation: Secondary | ICD-10-CM | POA: Diagnosis not present

## 2018-06-09 DIAGNOSIS — Z79899 Other long term (current) drug therapy: Secondary | ICD-10-CM | POA: Diagnosis not present

## 2018-06-09 DIAGNOSIS — R002 Palpitations: Secondary | ICD-10-CM | POA: Diagnosis not present

## 2018-06-09 LAB — CBC
HEMATOCRIT: 43.7 % (ref 39.0–52.0)
Hemoglobin: 14.5 g/dL (ref 13.0–17.0)
MCH: 28.5 pg (ref 26.0–34.0)
MCHC: 33.2 g/dL (ref 30.0–36.0)
MCV: 85.9 fL (ref 78.0–100.0)
Platelets: 191 10*3/uL (ref 150–400)
RBC: 5.09 MIL/uL (ref 4.22–5.81)
RDW: 13.4 % (ref 11.5–15.5)
WBC: 5.4 10*3/uL (ref 4.0–10.5)

## 2018-06-09 LAB — BASIC METABOLIC PANEL
Anion gap: 8 (ref 5–15)
BUN: 12 mg/dL (ref 6–20)
CHLORIDE: 107 mmol/L (ref 98–111)
CO2: 26 mmol/L (ref 22–32)
CREATININE: 1.07 mg/dL (ref 0.61–1.24)
Calcium: 9.6 mg/dL (ref 8.9–10.3)
GFR calc Af Amer: >60 mL/min (ref >60)
GFR calc non Af Amer: >60 mL/min (ref >60)
Glucose, Bld: 147 mg/dL — ABNORMAL HIGH (ref 70–99)
Potassium: 3.5 mmol/L (ref 3.5–5.1)
Sodium: 141 mmol/L (ref 135–145)

## 2018-06-09 LAB — I-STAT TROPONIN, ED: Troponin i, poc: 0 ng/mL (ref 0.00–0.08)

## 2018-06-09 LAB — TSH: TSH: 1.364 u[IU]/mL (ref 0.350–4.500)

## 2018-06-09 LAB — MAGNESIUM: Magnesium: 2.3 mg/dL (ref 1.7–2.4)

## 2018-06-09 MED ORDER — PROPOFOL 10 MG/ML IV BOLUS
INTRAVENOUS | Status: AC | PRN
  Administered 2018-06-09 (×3): 20 mg via INTRAVENOUS

## 2018-06-09 MED ORDER — DILTIAZEM LOAD VIA INFUSION
20.0000 mg | Freq: Once | INTRAVENOUS | Status: DC
  Filled 2018-06-09: qty 20

## 2018-06-09 MED ORDER — PROPOFOL 10 MG/ML IV BOLUS
40.0000 mg | Freq: Once | INTRAVENOUS | Status: AC
  Administered 2018-06-09: 20 mg via INTRAVENOUS
  Filled 2018-06-09: qty 20

## 2018-06-09 MED ORDER — DILTIAZEM HCL-DEXTROSE 100-5 MG/100ML-% IV SOLN (PREMIX): 5.0000 mg/h | INTRAVENOUS | Status: DC

## 2018-06-09 MED ORDER — SODIUM CHLORIDE 0.9 % IV BOLUS: 1000.0000 mL | Freq: Once | INTRAVENOUS | Status: DC

## 2018-06-09 MED ORDER — APIXABAN 5 MG PO TABS
5.0000 mg | ORAL_TABLET | Freq: Two times a day (BID) | ORAL | 0 refills | Status: DC
Start: 2018-06-09 — End: 2019-07-15

## 2018-06-09 MED ORDER — SODIUM CHLORIDE 0.9 % IV SOLN: INTRAVENOUS | Status: DC

## 2018-06-09 MED ORDER — APIXABAN 5 MG PO TABS
5.0000 mg | ORAL_TABLET | Freq: Two times a day (BID) | ORAL | Status: DC
  Administered 2018-06-09: 5 mg via ORAL
  Filled 2018-06-09: qty 1

## 2018-06-09 NOTE — Progress Notes (Signed)
ANTICOAGULATION CONSULT NOTE - Initial Consult  Pharmacy Consult for Apixaban Indication: atrial fibrillation  No Known Allergies  Patient Measurements: Height: 6\' 2"  (188 cm) Weight: 260 lb (117.9 kg) IBW/kg (Calculated) : 82.2  Vital Signs: Temp: 98 F (36.7 C) (07/16 1439) BP: 150/132 (07/16 1527) Pulse Rate: 162 (07/16 1527)  Labs: Recent Labs    06/09/18 1501  HGB 14.5  HCT 43.7  PLT 191    CrCl cannot be calculated (Patient's most recent lab result is older than the maximum 21 days allowed.).   Medical History: Past Medical History:  Diagnosis Date  . Anxiety   . Depression   . Hypertension     Medications:  See med rec  Assessment: 57 yo male presented to ED with atrial fibrillation. Pharmacy asked to start apixaban for anticoagulation.   Goal of Therapy:   Monitor platelets by anticoagulation protocol: Yes   Plan:  eliquis 5mg  po bid Educate about eliquis  Isac Sarna, BS Vena Austria, BCPS Clinical Pharmacist Pager [phone] 06/09/2018,3:30 PM

## 2018-06-09 NOTE — ED Triage Notes (Signed)
Patient complaining of palpitations, dizziness, and numbness to bilateral arms and face starting approximately 1 hour ago. States he has history of anxiety and has not been taking his venlafaxine "for months."

## 2018-06-09 NOTE — Sedation Documentation (Signed)
Shock given at 1603

## 2018-06-09 NOTE — ED Provider Notes (Addendum)
[facility] EMERGENCY DEPARTMENT Provider Note   CSN: [id number] Arrival date & time: 06/09/18  1426     History   Chief Complaint Chief Complaint  Patient presents with  . Palpitations    HPI Jason Fleming is a 57 y.o. male.  Pt presents to the ED today with palpitations that started around 1300.  The pt said he suddenly felt his heart "running away" and felt dizzy and sob.  The pt has never had anything like this in the past.  He does have a hx of anxiety, but this is different.  CHA2DS2/VAS Stroke Risk Points      N/A >= 2 Points: High Risk  1 - 1.99 Points: Medium Risk  0 Points: Low Risk    A final score could not be computed because of missing components.:   Last Change: N/A     This score determines the patient's risk of having a stroke if the  patient has atrial fibrillation.      This score is not applicable to this patient. Components are not  calculated.        Past Medical History:  Diagnosis Date  . Anxiety   . Depression   . Hypertension     Patient Active Problem List   Diagnosis Date Noted  . Depression   . Anxiety     Past Surgical History:  Procedure Laterality Date  . CARPAL TUNNEL RELEASE    . MASS EXCISION  05/26/2012   Procedure: MINOR EXCISION OF MASS;  Surgeon: Cammie Sickle., MD;  Location: [place];  Service: Orthopedics;  Laterality: Left;  . TONSILLECTOMY    . WISDOM TOOTH EXTRACTION          Home Medications    Prior to Admission medications   Medication Sig Start Date End Date Taking? Authorizing Provider  apixaban (ELIQUIS) 5 MG TABS tablet Take 1 tablet (5 mg total) by mouth 2 (two) times daily. 06/09/18   Isla Pence, MD  aspirin EC 81 MG tablet Take 81 mg by mouth daily.    [provider]  doxycycline (VIBRAMYCIN) 100 MG capsule Take 1 capsule (100 mg total) by mouth 2 (two) times daily. 02/02/15   Ashley Murrain, NP  fish oil-omega-3 fatty acids 1000 MG capsule Take 3 g by mouth 2  (two) times daily.     [provider]  HYDROcodone-acetaminophen (NORCO/VICODIN) 5-325 MG per tablet Take 1 tablet by mouth every 4 (four) hours as needed. 02/02/15   Ashley Murrain, NP  Multiple Vitamin (MULTIVITAMIN WITH MINERALS) TABS Take 1 tablet by mouth daily.    [provider]  sertraline (ZOLOFT) 50 MG tablet TAKE 2 TABLETS (100 MG TOTAL) BY MOUTH DAILY. 05/19/14   Susy Frizzle, MD  venlafaxine XR (EFFEXOR-XR) 75 MG 24 hr capsule TAKE 2 CAPSULES BY MOUTH DAILY WITH BREAKFAST 10/02/17   Susy Frizzle, MD    Family History History reviewed. No pertinent family history.  Social History Social History   Tobacco Use  . Smoking status: Current Every Day Smoker    Types: E-cigarettes    Last attempt to quit: 03/02/2012    Years since quitting: 6.2  . Smokeless tobacco: Never Used  Substance Use Topics  . Alcohol use: No  . Drug use: No     Allergies   Patient has no known allergies.   Review of Systems Review of Systems  Cardiovascular: Positive for palpitations.  Neurological: Positive for dizziness.  All other systems reviewed and are negative.    Physical Exam Updated Vital Signs BP 96/70   Pulse 81   Temp 98 F (36.7 C)   Resp 15   Ht 6\' 2"  (1.88 m)   Wt 117.9 kg (260 lb)   SpO2 99%   BMI 33.38 kg/m   Physical Exam  Constitutional: He is oriented to person, place, and time. He appears well-developed and well-nourished.  HENT:  Head: Normocephalic and atraumatic.  Right Ear: External ear normal.  Left Ear: External ear normal.  Nose: Nose normal.  Mouth/Throat: Oropharynx is clear and moist.  Eyes: Pupils are equal, round, and reactive to light. Conjunctivae and EOM are normal.  Neck: Normal range of motion. Neck supple.  Cardiovascular: An irregularly irregular rhythm present. Tachycardia present.  Pulmonary/Chest: Effort normal and breath sounds normal.  Abdominal: Soft. Bowel sounds are normal.  Musculoskeletal: Normal  range of motion.  Neurological: He is alert and oriented to person, place, and time.  Skin: Skin is warm. Capillary refill takes less than 2 seconds.  Psychiatric: He has a normal mood and affect. His behavior is normal. Judgment and thought content normal.  Nursing note and vitals reviewed.    ED Treatments / Results  Labs (all labs ordered are listed, but only abnormal results are displayed) Labs Reviewed  BASIC METABOLIC PANEL - Abnormal; Notable for the following components:      Result Value   Glucose, Bld 147 (*)    All other components within normal limits  MAGNESIUM  CBC  TSH  I-STAT TROPONIN, ED    EKG EKG Interpretation  Date/Time:  Tuesday June 09 2018 15:05:26 EDT Ventricular Rate:  157 PR Interval:    QRS Duration: 91 QT Interval:  251 QTC Calculation: 406 R Axis:   46 Text Interpretation:  Atrial fibrillation Repolarization abnormality, prob rate related Baseline wander in lead(s) II III aVF No significant change since last tracing Confirmed by Isla Pence [phone]) on 06/09/2018 3:34:49 PM   Radiology Dg Chest Port 1 View  Result Date: 06/09/2018 CLINICAL DATA:  Atrial fibrillation EXAM: PORTABLE CHEST 1 VIEW COMPARISON:  None. FINDINGS: No active infiltrate or effusion is seen. Mediastinal and hilar contours are unremarkable. The heart is mildly enlarged. No bony abnormality is seen. IMPRESSION: 1. Mild cardiomegaly. 2. No active lung disease. Electronically Signed   By: Ivar Drape M.D.   On: 06/09/2018 15:38    Procedures .Sedation Date/Time: 06/09/2018 4:31 PM Performed by: Isla Pence, MD Authorized by: Isla Pence, MD   Consent:    Consent obtained:  Written   Consent given by:  Patient   Risks discussed:  Allergic reaction, dysrhythmia, inadequate sedation, nausea, prolonged hypoxia resulting in organ damage, prolonged sedation necessitating reversal, respiratory compromise necessitating ventilatory assistance and intubation and  vomiting   Alternatives discussed:  Analgesia without sedation, anxiolysis and regional anesthesia Universal protocol:    Procedure explained and questions answered to patient or proxy's satisfaction: yes     Relevant documents present and verified: yes     Test results available and properly labeled: yes     Imaging studies available: yes     Required blood products, implants, devices, and special equipment available: yes     Site/side marked: yes     Immediately prior to procedure a time out was called: yes     Patient identity confirmation method:  Verbally with patient Indications:    Procedure performed:  Cardioversion   Procedure necessitating sedation performed by:  Physician performing sedation   Intended level of sedation:  Deep Pre-sedation assessment:    Time since last food or drink:  5   ASA classification: class 2 - patient with mild systemic disease     Neck mobility: normal     Mouth opening:  3 or more finger widths   Thyromental distance:  4 finger widths   Mallampati score:  I - soft palate, uvula, fauces, pillars visible   Pre-sedation assessments completed and reviewed: airway patency, cardiovascular function, hydration status, mental status, nausea/vomiting, pain level, respiratory function and temperature   Immediate pre-procedure details:    Reassessment: Patient reassessed immediately prior to procedure     Reviewed: vital signs, relevant labs/tests and NPO status     Verified: bag valve mask available, emergency equipment available, intubation equipment available, IV patency confirmed, oxygen available and suction available   Procedure details (see MAR for exact dosages):    Preoxygenation:  Nasal cannula   Sedation:  Propofol   Intra-procedure monitoring:  Blood pressure monitoring, cardiac monitor, continuous pulse oximetry, frequent LOC assessments, frequent vital sign checks and continuous capnometry   Intra-procedure events: none     Total Provider  sedation time (minutes):  30 Post-procedure details:    Post-sedation assessment completed:  06/09/2018 4:32 PM   Attendance: Constant attendance by certified staff until patient recovered     Recovery: Patient returned to pre-procedure baseline     Post-sedation assessments completed and reviewed: airway patency, cardiovascular function, hydration status, mental status, nausea/vomiting, pain level, respiratory function and temperature     Patient is stable for discharge or admission: yes     Patient tolerance:  Tolerated well, no immediate complications .Cardioversion Date/Time: 06/09/2018 4:32 PM Performed by: Isla Pence, MD Authorized by: Isla Pence, MD   Consent:    Consent obtained:  Written   Consent given by:  Patient   Risks discussed:  Cutaneous burn, death, induced arrhythmia and pain   Alternatives discussed:  No treatment Pre-procedure details:    Cardioversion basis:  Emergent   Rhythm:  Atrial fibrillation   Electrode placement:  Anterior-posterior Patient sedated: Yes. Refer to sedation procedure documentation for details of sedation.  Attempt one:    Cardioversion mode:  Synchronous   Shock (Joules):  200   Shock outcome:  Conversion to normal sinus rhythm Post-procedure details:    Patient status:  Awake   Patient tolerance of procedure:  Tolerated well, no immediate complications   (including critical care time)  Medications Ordered in ED Medications  sodium chloride 0.9 % bolus 1,000 mL (has no administration in time range)    And  0.9 %  sodium chloride infusion (has no administration in time range)  apixaban (ELIQUIS) tablet 5 mg (5 mg Oral Given 06/09/18 1708)  propofol (DIPRIVAN) 10 mg/mL bolus/IV push 40 mg (20 mg Intravenous Given 06/09/18 1547)  propofol (DIPRIVAN) 10 mg/mL bolus/IV push (20 mg Intravenous Given 06/09/18 1600)     Initial Impression / Assessment and Plan / ED Course  I have reviewed the triage vital signs and the nursing  notes.  Pertinent labs & imaging results that were available during my care of the patient were reviewed by me and considered in my medical decision making (see chart for details).     Pt has a known onset of afib starting around 1300.  He is a chadvasc 1.  He was d/w Dr. Domenic Polite (cardiology) who felt that it was ok to attempt cardioversion.   Pt was  successfully cardioverted into NSR.  The pharmacist came down to give pt education about Eliquis.  He was referred to the afib clinic.  He knows to return if worse.  CRITICAL CARE Performed by: Isla Pence   Total critical care time: 30 minutes  Critical care time was exclusive of separately billable procedures and treating other patients.  Critical care was necessary to treat or prevent imminent or life-threatening deterioration.  Critical care was time spent personally by me on the following activities: development of treatment plan with patient and/or surrogate as well as nursing, discussions with consultants, evaluation of patient's response to treatment, examination of patient, obtaining history from patient or surrogate, ordering and performing treatments and interventions, ordering and review of laboratory studies, ordering and review of radiographic studies, pulse oximetry and re-evaluation of patient's condition.  Final Clinical Impressions(s) / ED Diagnoses   Final diagnoses:  Atrial fibrillation with RVR [facility])    ED Discharge Orders        Ordered    Amb referral to AFIB Clinic     06/09/18 1502    apixaban (ELIQUIS) 5 MG TABS tablet  2 times daily     06/09/18 1710       Isla Pence, MD 06/09/18 1711    Isla Pence, MD 06/22/18 [phone]

## 2018-06-09 NOTE — ED Notes (Signed)
Family at bedside. 

## 2018-06-09 NOTE — ED Notes (Signed)
Pt sitting up in bed, given water. Maintained fluids well.  Pt A/o. VSS

## 2018-06-09 NOTE — ED Notes (Signed)
ED Provider at bedside. 

## 2018-06-10 ENCOUNTER — Telehealth (HOSPITAL_COMMUNITY): Payer: Self-pay | Admitting: *Deleted

## 2018-06-10 NOTE — Telephone Encounter (Signed)
Referral from ER - LM to call back to schedule afib clinic appt.

## 2018-06-11 ENCOUNTER — Encounter (HOSPITAL_COMMUNITY): Payer: Self-pay | Admitting: Nurse Practitioner

## 2018-06-11 ENCOUNTER — Ambulatory Visit (HOSPITAL_COMMUNITY)
Admission: RE | Admit: 2018-06-11 | Discharge: 2018-06-11 | Disposition: A | Discharge status: Home / Self Care | Payer: BLUE CROSS/BLUE SHIELD | Source: Ambulatory Visit | Attending: Nurse Practitioner | Admitting: Nurse Practitioner

## 2018-06-11 VITALS — BP 148/88 | HR 77 | Ht 74.0 in | Wt 251.2 lb

## 2018-06-11 DIAGNOSIS — Z7982 Long term (current) use of aspirin: Secondary | ICD-10-CM | POA: Insufficient documentation

## 2018-06-11 DIAGNOSIS — F329 Major depressive disorder, single episode, unspecified: Secondary | ICD-10-CM | POA: Diagnosis not present

## 2018-06-11 DIAGNOSIS — I10 Essential (primary) hypertension: Secondary | ICD-10-CM | POA: Diagnosis not present

## 2018-06-11 DIAGNOSIS — F419 Anxiety disorder, unspecified: Secondary | ICD-10-CM | POA: Insufficient documentation

## 2018-06-11 DIAGNOSIS — F17210 Nicotine dependence, cigarettes, uncomplicated: Secondary | ICD-10-CM | POA: Diagnosis not present

## 2018-06-11 DIAGNOSIS — Z79899 Other long term (current) drug therapy: Secondary | ICD-10-CM | POA: Diagnosis not present

## 2018-06-11 DIAGNOSIS — I498 Other specified cardiac arrhythmias: Secondary | ICD-10-CM | POA: Diagnosis not present

## 2018-06-11 DIAGNOSIS — R634 Abnormal weight loss: Secondary | ICD-10-CM | POA: Insufficient documentation

## 2018-06-11 DIAGNOSIS — I480 Paroxysmal atrial fibrillation: Secondary | ICD-10-CM | POA: Diagnosis not present

## 2018-06-11 MED ORDER — DILTIAZEM HCL 30 MG PO TABS: ORAL_TABLET | ORAL | 1 refills | Status: DC

## 2018-06-11 NOTE — Progress Notes (Signed)
Primary Care Physician: Susy Frizzle, MD Referring Physician: [facility] ER f/u   Jason Fleming is a 57 y.o. male with a h/o anxiety/depression/HTN(currently not on any BP meds since weight loss). He was in his [facility] when he developed rapid heart rate. After resting quietly for around 20-30 mins he went to the ER. EKG showed afib with RVR. This is the first dx of this. He was successfully cardioverted and started on eliquis 5 mg bid x one month with a chadsvasc score of 0..  He states that he does snore but does not believe he has apnea. He does not drink alcohol, he does vape, drank several refills of tea at lunch when the episode came on. No regular exercise. He did lose 30 lbs over a year ago but has gained some back. He remains in SR today.  Today, he denies symptoms of palpitations, chest pain, shortness of breath, orthopnea, PND, lower extremity edema, dizziness, presyncope, syncope, or neurologic sequela. The patient is tolerating medications without difficulties and is otherwise without complaint today.   Past Medical History:  Diagnosis Date  . Anxiety   . Depression   . Hypertension    Past Surgical History:  Procedure Laterality Date  . CARPAL TUNNEL RELEASE    . MASS EXCISION  05/26/2012   Procedure: MINOR EXCISION OF MASS;  Surgeon: Cammie Sickle., MD;  Location: [place];  Service: Orthopedics;  Laterality: Left;  . TONSILLECTOMY    . WISDOM TOOTH EXTRACTION      Current Outpatient Medications  Medication Sig Dispense Refill  . apixaban (ELIQUIS) 5 MG TABS tablet Take 1 tablet (5 mg total) by mouth 2 (two) times daily. 60 tablet 0  . aspirin EC 81 MG tablet Take 81 mg by mouth daily.    Marland Kitchen doxycycline (VIBRAMYCIN) 100 MG capsule Take 1 capsule (100 mg total) by mouth 2 (two) times daily. 20 capsule 0  . fish oil-omega-3 fatty acids 1000 MG capsule Take 3 g by mouth 2 (two) times daily.     Marland Kitchen HYDROcodone-acetaminophen (NORCO/VICODIN) 5-325 MG per tablet  Take 1 tablet by mouth every 4 (four) hours as needed. 20 tablet 0  . Multiple Vitamin (MULTIVITAMIN WITH MINERALS) TABS Take 1 tablet by mouth daily.    . sertraline (ZOLOFT) 50 MG tablet TAKE 2 TABLETS (100 MG TOTAL) BY MOUTH DAILY. 60 tablet 0  . venlafaxine XR (EFFEXOR-XR) 75 MG 24 hr capsule TAKE 2 CAPSULES BY MOUTH DAILY WITH BREAKFAST 180 capsule 3  . diltiazem (CARDIZEM) 30 MG tablet Take 1 tablet every 4 hours AS NEEDED for AFIB heart rate >100 as long as top blood pressure >100. 45 tablet 1   No current facility-administered medications for this encounter.     No Known Allergies  Social History   Socioeconomic History  . Marital status: Married    Spouse name: Not on file  . Number of children: Not on file  . Years of education: Not on file  . Highest education level: Not on file  Occupational History  . Not on file  Social Needs  . Financial resource strain: Not on file  . Food insecurity:    Worry: Not on file    Inability: Not on file  . Transportation needs:    Medical: Not on file    Non-medical: Not on file  Tobacco Use  . Smoking status: Current Every Day Smoker    Types: E-cigarettes    Last attempt to  quit: 03/02/2012    Years since quitting: 6.2  . Smokeless tobacco: Never Used  Substance and Sexual Activity  . Alcohol use: No  . Drug use: No  . Sexual activity: Not on file  Lifestyle  . Physical activity:    Days per week: Not on file    Minutes per session: Not on file  . Stress: Not on file  Relationships  . Social connections:    Talks on phone: Not on file    Gets together: Not on file    Attends religious service: Not on file    Active member of club or organization: Not on file    Attends meetings of clubs or organizations: Not on file    Relationship status: Not on file  . Intimate partner violence:    Fear of current or ex partner: Not on file    Emotionally abused: Not on file    Physically abused: Not on file    Forced sexual  activity: Not on file  Other Topics Concern  . Not on file  Social History Narrative  . Not on file    History reviewed. No pertinent family history.  ROS- All systems are reviewed and negative except as per the HPI above  Physical Exam: Vitals:   06/11/18 0843  BP: (!) 148/88  Pulse: 77  SpO2: 97%  Weight: 251 lb 4 oz (114 kg)  Height: 6\' 2"  (1.88 m)   Wt Readings from Last 3 Encounters:  06/11/18 251 lb 4 oz (114 kg)  06/09/18 260 lb (117.9 kg)  02/02/15 240 lb (108.9 kg)    Labs: Lab Results  Component Value Date   NA 141 06/09/2018   K 3.5 06/09/2018   CL 107 06/09/2018   CO2 26 06/09/2018   GLUCOSE 147 (H) 06/09/2018   BUN 12 06/09/2018   CREATININE 1.07 06/09/2018   CALCIUM 9.6 06/09/2018   MG 2.3 06/09/2018   No results found for: INR Lab Results  Component Value Date   CHOL 200 07/19/2014   HDL 46 07/19/2014   LDLCALC 123 (H) 07/19/2014   TRIG 156 (H) 07/19/2014     GEN- The patient is well appearing, alert and oriented x 3 today.   Head- normocephalic, atraumatic Eyes-  Sclera clear, conjunctiva pink Ears- hearing intact Oropharynx- clear Neck- supple, no JVP Lymph- no cervical lymphadenopathy Lungs- Clear to ausculation bilaterally, normal work of breathing Heart- Regular rate and rhythm, no murmurs, rubs or gallops, PMI not laterally displaced GI- soft, NT, ND, + BS Extremities- no clubbing, cyanosis, or edema MS- no significant deformity or atrophy Skin- no rash or lesion Psych- euthymic mood, full affect Neuro- strength and sensation are intact  EKG-SR at 73 bpm, normal ekg Er records reviewed, labs and ekg's    Assessment and Plan: 1. Paroxysmal afib, new onset Successful cardioversion General eduction re afib and triggers Advised lifestyle modification in the way of stop vaping, limit caffeine, develop a walking exercise program and diet modification for weight loss He does snore but his wife has never reported that he has  apnea He will question her again and if so, he will call and will refer for sleep study 30 mg Cardizem as needed for afib but will not start daily rate control unless afib burden increases Echo   2. Chadsvasc score of 0 He will finish 4 weeks of eliquis as per cardioversion protocol  and then stop drug   If echo shows normal structure he will f/u  with PCP  If structural  abnormalities, will refer to general cardiology afib clinic as needed  Geroge Baseman. Keneshia Tena, [facility] [address] [place], [place] [address] [phone]

## 2018-06-11 NOTE — Patient Instructions (Signed)
Cardizem 30mg -- take 1 tablet every 4 hours AS NEEDED for AFIB heart rate >100 as long as top number of blood pressure >100.   ?

## 2018-06-12 ENCOUNTER — Encounter: Payer: Self-pay | Admitting: Family Medicine

## 2018-06-12 ENCOUNTER — Ambulatory Visit (HOSPITAL_COMMUNITY)
Admission: RE | Admit: 2018-06-12 | Discharge: 2018-06-12 | Disposition: A | Discharge status: Home / Self Care | Payer: BLUE CROSS/BLUE SHIELD | Source: Ambulatory Visit | Attending: Nurse Practitioner | Admitting: Nurse Practitioner

## 2018-06-12 ENCOUNTER — Other Ambulatory Visit: Payer: Self-pay

## 2018-06-12 ENCOUNTER — Ambulatory Visit (INDEPENDENT_AMBULATORY_CARE_PROVIDER_SITE_OTHER): Payer: BLUE CROSS/BLUE SHIELD | Admitting: Family Medicine

## 2018-06-12 VITALS — BP 130/80 | HR 85 | Temp 97.7°F | Resp 16 | Ht 73.0 in | Wt 251.0 lb

## 2018-06-12 DIAGNOSIS — R739 Hyperglycemia, unspecified: Secondary | ICD-10-CM

## 2018-06-12 DIAGNOSIS — F17290 Nicotine dependence, other tobacco product, uncomplicated: Secondary | ICD-10-CM | POA: Insufficient documentation

## 2018-06-12 DIAGNOSIS — F419 Anxiety disorder, unspecified: Secondary | ICD-10-CM

## 2018-06-12 DIAGNOSIS — I77810 Thoracic aortic ectasia: Secondary | ICD-10-CM | POA: Insufficient documentation

## 2018-06-12 DIAGNOSIS — I480 Paroxysmal atrial fibrillation: Secondary | ICD-10-CM | POA: Insufficient documentation

## 2018-06-12 DIAGNOSIS — Z7689 Persons encountering health services in other specified circumstances: Secondary | ICD-10-CM

## 2018-06-12 DIAGNOSIS — Z125 Encounter for screening for malignant neoplasm of prostate: Secondary | ICD-10-CM

## 2018-06-12 DIAGNOSIS — I119 Hypertensive heart disease without heart failure: Secondary | ICD-10-CM | POA: Diagnosis not present

## 2018-06-12 MED ORDER — SERTRALINE HCL 50 MG PO TABS
ORAL_TABLET | ORAL | 3 refills | Status: DC
Start: 2018-06-12 — End: 2018-07-08

## 2018-06-12 NOTE — Progress Notes (Signed)
  Echocardiogram 2D Echocardiogram has been performed.  Jennette Dubin 06/12/2018, 8:41 AM

## 2018-06-12 NOTE — Progress Notes (Signed)
Subjective:     Patient ID: Jason Fleming, male   DOB: Mar 28, 1961, 57 y.o.   MRN: [id number]  HPI Patient is a 57 year old Caucasian male who is a former patient of this clinic.  He had not been seen here in many years.  He is here today to reestablish care.  Of note, Saturday, he developed sudden tachycardia.  He went to the emergency room where he was found to be in A. fib with RVR.  Patient underwent cardioversion in the hospital.  He was discharged on Eliquis 5 mg twice daily.  He is seeing cardiology and they have perform an echocardiogram this morning although the results are not back yet.  Since his cardioversion, he denies any palpitations however today on his exam, the patient has some faint irregularity in his pulse.  It is difficult to ascertain if this may be sinus arrhythmia versus PACs versus A. fib and therefore I have recommended repeating an EKG today.  He is asymptomatic.  EKG yesterday revealed marked sinus arrhythmia which may also explain what I am auscultating.  EKG today shows marked sinus bradycardia with heart rate of 49 bpm.  There are normal intervals.  Patient has a normal axis.  There is no evidence of ischemia or infarction.  He is requesting a refill on his Zoloft.  He takes 100 mg p.o. nightly for anxiety.  This medication is been working well for him and he would like to continue it long-term. Past Medical History:  Diagnosis Date  . Anxiety   . Depression   . Hypertension   . Paroxysmal atrial fibrillation ([place])    cardioversion in ed 05/2018   Past Surgical History:  Procedure Laterality Date  . CARPAL TUNNEL RELEASE    . MASS EXCISION  05/26/2012   Procedure: MINOR EXCISION OF MASS;  Surgeon: Cammie Sickle., MD;  Location: [facility];  Service: Orthopedics;  Laterality: Left;  . TONSILLECTOMY    . WISDOM TOOTH EXTRACTION     Current Outpatient Medications on File Prior to Visit  Medication Sig Dispense Refill  . apixaban (ELIQUIS) 5 MG  TABS tablet Take 1 tablet (5 mg total) by mouth 2 (two) times daily. 60 tablet 0  . diltiazem (CARDIZEM) 30 MG tablet Take 1 tablet every 4 hours AS NEEDED for AFIB heart rate >100 as long as top blood pressure >100. 45 tablet 1  . sertraline (ZOLOFT) 50 MG tablet TAKE 2 TABLETS (100 MG TOTAL) BY MOUTH DAILY. (Patient not taking: Reported on 06/12/2018) 60 tablet 0   No current facility-administered medications on file prior to visit.    No Known Allergies Social History   Socioeconomic History  . Marital status: Married    Spouse name: Not on file  . Number of children: Not on file  . Years of education: Not on file  . Highest education level: Not on file  Occupational History  . Not on file  Social Needs  . Financial resource strain: Not on file  . Food insecurity:    Worry: Not on file    Inability: Not on file  . Transportation needs:    Medical: Not on file    Non-medical: Not on file  Tobacco Use  . Smoking status: Current Every Day Smoker    Types: E-cigarettes    Last attempt to quit: 03/02/2012    Years since quitting: 6.2  . Smokeless tobacco: Never Used  Substance and Sexual Activity  . Alcohol use: No  .  Drug use: No  . Sexual activity: Not on file  Lifestyle  . Physical activity:    Days per week: Not on file    Minutes per session: Not on file  . Stress: Not on file  Relationships  . Social connections:    Talks on phone: Not on file    Gets together: Not on file    Attends religious service: Not on file    Active member of club or organization: Not on file    Attends meetings of clubs or organizations: Not on file    Relationship status: Not on file  . Intimate partner violence:    Fear of current or ex partner: Not on file    Emotionally abused: Not on file    Physically abused: Not on file    Forced sexual activity: Not on file  Other Topics Concern  . Not on file  Social History Narrative  . Not on file   Family History  Problem Relation Age  of Onset  . Anxiety disorder Mother   . Hypertension Mother   . Depression Father   . Heart disease Father   . Alcohol abuse Maternal Uncle   . Alcohol abuse Paternal Uncle   . Stroke Maternal Grandfather    Both father and mother suffer from diabetes.  In the emergency room on Saturday, his blood sugar was elevated however this was a nonfasting sample  Review of Systems  All other systems reviewed and are negative.      Objective:   Physical Exam  Constitutional: He is oriented to person, place, and time. He appears well-developed and well-nourished. No distress.  HENT:  Head: Normocephalic and atraumatic.  Mouth/Throat: Oropharynx is clear and moist.  Eyes: Pupils are equal, round, and reactive to light. Conjunctivae and EOM are normal.  Neck: No JVD present. No thyromegaly present.  Cardiovascular: Normal rate. Exam reveals no gallop and no friction rub.  No murmur heard. Pulmonary/Chest: Effort normal and breath sounds normal. No stridor. No respiratory distress. He has no wheezes. He has no rales. He exhibits no tenderness.  Abdominal: Soft. Bowel sounds are normal. He exhibits no distension and no mass. There is no tenderness. There is no rebound and no guarding. No hernia.  Musculoskeletal: He exhibits no edema.  Neurological: He is alert and oriented to person, place, and time. He displays normal reflexes. No cranial nerve deficit. He exhibits normal muscle tone. Coordination normal.  Skin: He is not diaphoretic.  Vitals reviewed.      Assessment:     Paroxysmal atrial fibrillation ([place]) - Plan: EKG 12-Lead  Anxiety  Encounter to establish care with new doctor  Elevated blood sugar - Plan: COMPLETE METABOLIC PANEL WITH GFR, Lipid panel, Hemoglobin A1c  Prostate cancer screening - Plan: PSA      Plan:    EKG confirms normal sinus rhythm.  Patient will continue anticoagulation as currently recommended by cardiology for 1 month and then if there are no other  episodes of atrial fibrillation, he will discontinue.  Given the elevated blood sugar the patient experienced in the hospital, I will check a hemoglobin A1c although I believe this is most likely a nonfasting random sugar.  However given his family history with his mother and father I will screen the patient for diabetes with an A1c.  I will also check a fasting lipid panel.  His blood pressure today is borderline elevated.  This is something we will clinically monitor and if persistently elevated,  I will treat the patient to achieve a goal blood pressure less than 140/90.  I will screen the patient for prostate cancer with PSA.  His last colonoscopy was reported to be in 2013 and is up-to-date.

## 2018-06-13 LAB — COMPLETE METABOLIC PANEL WITH GFR
AG Ratio: 2.2 (calc) (ref 1.0–2.5)
ALBUMIN MSPROF: 4.8 g/dL (ref 3.6–5.1)
ALT: 22 U/L (ref 9–46)
AST: 18 U/L (ref 10–35)
Alkaline phosphatase (APISO): 56 U/L (ref 40–115)
BUN: 10 mg/dL (ref 7–25)
CALCIUM: 9.7 mg/dL (ref 8.6–10.3)
CO2: 27 mmol/L (ref 20–32)
CREATININE: 1.04 mg/dL (ref 0.70–1.33)
Chloride: 103 mmol/L (ref 98–110)
GFR, EST AFRICAN AMERICAN: 93 mL/min/{1.73_m2} (ref >=60)
GFR, EST NON AFRICAN AMERICAN: 80 mL/min/{1.73_m2} (ref >=60)
GLOBULIN: 2.2 g/dL (ref 1.9–3.7)
Glucose, Bld: 84 mg/dL (ref 65–99)
Potassium: 4.5 mmol/L (ref 3.5–5.3)
SODIUM: 139 mmol/L (ref 135–146)
Total Bilirubin: 0.6 mg/dL (ref 0.2–1.2)
Total Protein: 7 g/dL (ref 6.1–8.1)

## 2018-06-13 LAB — LIPID PANEL
CHOL/HDL RATIO: 4.7 (calc) (ref <5.0)
Cholesterol: 187 mg/dL (ref <200)
HDL: 40 mg/dL — ABNORMAL LOW (ref >40)
LDL Cholesterol (Calc): 112 mg/dL (calc) — ABNORMAL HIGH
NON-HDL CHOLESTEROL (CALC): 147 mg/dL — AB (ref <130)
Triglycerides: 233 mg/dL — ABNORMAL HIGH (ref <150)

## 2018-06-13 LAB — HEMOGLOBIN A1C
HEMOGLOBIN A1C: 5.7 %{Hb} — AB (ref <5.7)
MEAN PLASMA GLUCOSE: 117 (calc)
eAG (mmol/L): 6.5 (calc)

## 2018-06-13 LAB — PSA: PSA: 0.2 ng/mL (ref <=4.0)

## 2018-06-15 ENCOUNTER — Encounter: Payer: Self-pay | Admitting: Family Medicine

## 2018-06-16 ENCOUNTER — Other Ambulatory Visit (HOSPITAL_COMMUNITY): Payer: Self-pay | Admitting: *Deleted

## 2018-06-16 DIAGNOSIS — I480 Paroxysmal atrial fibrillation: Secondary | ICD-10-CM

## 2018-06-17 NOTE — Addendum Note (Signed)
Encounter addended by: Sherran Needs, NP on: 06/17/2018 2:16 PM  Actions taken: LOS modified

## 2018-07-03 ENCOUNTER — Other Ambulatory Visit (HOSPITAL_COMMUNITY): Payer: Self-pay | Admitting: Nurse Practitioner

## 2018-07-08 ENCOUNTER — Telehealth: Payer: Self-pay | Admitting: Family Medicine

## 2018-07-08 MED ORDER — VENLAFAXINE HCL ER 75 MG PO CP24: ORAL_CAPSULE | ORAL | 3 refills | Status: DC

## 2018-07-08 NOTE — Telephone Encounter (Signed)
Pt called and states that he requested the wrong medication for his depression. He can not tolerate Zoloft as it gives him severe diarrhea and stomach upset. He needed a refill on Effexor instead. Rx sent to [facility].

## 2018-07-13 ENCOUNTER — Other Ambulatory Visit: Payer: Self-pay | Admitting: Family Medicine

## 2018-07-13 MED ORDER — VENLAFAXINE HCL ER 75 MG PO CP24: ORAL_CAPSULE | ORAL | 3 refills | Status: DC

## 2018-07-15 ENCOUNTER — Other Ambulatory Visit: Payer: Self-pay | Admitting: Family Medicine

## 2018-07-15 MED ORDER — VENLAFAXINE HCL ER 75 MG PO CP24: ORAL_CAPSULE | ORAL | 3 refills | Status: DC

## 2018-07-16 MED ORDER — VENLAFAXINE HCL ER 150 MG PO CP24
150.0000 mg | ORAL_CAPSULE | Freq: Every day | ORAL | 3 refills | Status: DC
Start: 2018-07-16 — End: 2018-12-01

## 2018-07-16 NOTE — Addendum Note (Signed)
Addended by: Shary Decamp B on: 07/16/2018 03:23 PM   Modules accepted: Orders

## 2018-12-01 ENCOUNTER — Other Ambulatory Visit: Payer: Self-pay | Admitting: *Deleted

## 2018-12-01 MED ORDER — VENLAFAXINE HCL ER 150 MG PO CP24: 150.0000 mg | ORAL_CAPSULE | Freq: Every day | ORAL | 3 refills | Status: DC

## 2019-05-04 DIAGNOSIS — M9901 Segmental and somatic dysfunction of cervical region: Secondary | ICD-10-CM | POA: Diagnosis not present

## 2019-05-04 DIAGNOSIS — M9902 Segmental and somatic dysfunction of thoracic region: Secondary | ICD-10-CM | POA: Diagnosis not present

## 2019-05-04 DIAGNOSIS — M546 Pain in thoracic spine: Secondary | ICD-10-CM | POA: Diagnosis not present

## 2019-05-11 DIAGNOSIS — M546 Pain in thoracic spine: Secondary | ICD-10-CM | POA: Diagnosis not present

## 2019-05-11 DIAGNOSIS — M9901 Segmental and somatic dysfunction of cervical region: Secondary | ICD-10-CM | POA: Diagnosis not present

## 2019-05-11 DIAGNOSIS — M9902 Segmental and somatic dysfunction of thoracic region: Secondary | ICD-10-CM | POA: Diagnosis not present

## 2019-05-18 DIAGNOSIS — M9902 Segmental and somatic dysfunction of thoracic region: Secondary | ICD-10-CM | POA: Diagnosis not present

## 2019-05-18 DIAGNOSIS — M546 Pain in thoracic spine: Secondary | ICD-10-CM | POA: Diagnosis not present

## 2019-05-18 DIAGNOSIS — M9901 Segmental and somatic dysfunction of cervical region: Secondary | ICD-10-CM | POA: Diagnosis not present

## 2019-05-25 DIAGNOSIS — M546 Pain in thoracic spine: Secondary | ICD-10-CM | POA: Diagnosis not present

## 2019-05-25 DIAGNOSIS — M9901 Segmental and somatic dysfunction of cervical region: Secondary | ICD-10-CM | POA: Diagnosis not present

## 2019-05-25 DIAGNOSIS — M9902 Segmental and somatic dysfunction of thoracic region: Secondary | ICD-10-CM | POA: Diagnosis not present

## 2019-06-01 DIAGNOSIS — M546 Pain in thoracic spine: Secondary | ICD-10-CM | POA: Diagnosis not present

## 2019-06-01 DIAGNOSIS — M9902 Segmental and somatic dysfunction of thoracic region: Secondary | ICD-10-CM | POA: Diagnosis not present

## 2019-06-01 DIAGNOSIS — M9901 Segmental and somatic dysfunction of cervical region: Secondary | ICD-10-CM | POA: Diagnosis not present

## 2019-06-15 DIAGNOSIS — M546 Pain in thoracic spine: Secondary | ICD-10-CM | POA: Diagnosis not present

## 2019-06-15 DIAGNOSIS — M9901 Segmental and somatic dysfunction of cervical region: Secondary | ICD-10-CM | POA: Diagnosis not present

## 2019-06-15 DIAGNOSIS — M9902 Segmental and somatic dysfunction of thoracic region: Secondary | ICD-10-CM | POA: Diagnosis not present

## 2019-06-29 DIAGNOSIS — M9902 Segmental and somatic dysfunction of thoracic region: Secondary | ICD-10-CM | POA: Diagnosis not present

## 2019-06-29 DIAGNOSIS — M9901 Segmental and somatic dysfunction of cervical region: Secondary | ICD-10-CM | POA: Diagnosis not present

## 2019-06-29 DIAGNOSIS — M9907 Segmental and somatic dysfunction of upper extremity: Secondary | ICD-10-CM | POA: Diagnosis not present

## 2019-07-09 ENCOUNTER — Other Ambulatory Visit: Payer: BC Managed Care – PPO

## 2019-07-09 ENCOUNTER — Other Ambulatory Visit: Payer: Self-pay

## 2019-07-09 DIAGNOSIS — Z0000 Encounter for general adult medical examination without abnormal findings: Secondary | ICD-10-CM

## 2019-07-10 LAB — PSA: PSA: 0.3 ng/mL (ref <=4.0)

## 2019-07-10 LAB — LIPID PANEL
Cholesterol: 210 mg/dL — ABNORMAL HIGH (ref <200)
HDL: 41 mg/dL (ref >=40)
LDL Cholesterol (Calc): 136 mg/dL (calc) — ABNORMAL HIGH
Non-HDL Cholesterol (Calc): 169 mg/dL (calc) — ABNORMAL HIGH (ref <130)
Total CHOL/HDL Ratio: 5.1 (calc) — ABNORMAL HIGH (ref <5.0)
Triglycerides: 191 mg/dL — ABNORMAL HIGH (ref <150)

## 2019-07-10 LAB — CBC WITH DIFFERENTIAL/PLATELET
Absolute Monocytes: 567 cells/uL (ref 200–950)
Basophils Absolute: 90 cells/uL (ref 0–200)
Basophils Relative: 2 %
Eosinophils Absolute: 162 cells/uL (ref 15–500)
Eosinophils Relative: 3.6 %
HCT: 41.1 % (ref 38.5–50.0)
Hemoglobin: 13.6 g/dL (ref 13.2–17.1)
Lymphs Abs: 1674 cells/uL (ref 850–3900)
MCH: 28.3 pg (ref 27.0–33.0)
MCHC: 33.1 g/dL (ref 32.0–36.0)
MCV: 85.6 fL (ref 80.0–100.0)
MPV: 10.8 fL (ref 7.5–12.5)
Monocytes Relative: 12.6 %
Neutro Abs: 2007 cells/uL (ref 1500–7800)
Neutrophils Relative %: 44.6 %
Platelets: 211 10*3/uL (ref 140–400)
RBC: 4.8 10*6/uL (ref 4.20–5.80)
RDW: 13.2 % (ref 11.0–15.0)
Total Lymphocyte: 37.2 %
WBC: 4.5 10*3/uL (ref 3.8–10.8)

## 2019-07-13 DIAGNOSIS — M9902 Segmental and somatic dysfunction of thoracic region: Secondary | ICD-10-CM | POA: Diagnosis not present

## 2019-07-13 DIAGNOSIS — M5032 Other cervical disc degeneration, mid-cervical region, unspecified level: Secondary | ICD-10-CM | POA: Diagnosis not present

## 2019-07-13 DIAGNOSIS — M9901 Segmental and somatic dysfunction of cervical region: Secondary | ICD-10-CM | POA: Diagnosis not present

## 2019-07-13 DIAGNOSIS — M9907 Segmental and somatic dysfunction of upper extremity: Secondary | ICD-10-CM | POA: Diagnosis not present

## 2019-07-14 ENCOUNTER — Other Ambulatory Visit: Payer: Self-pay

## 2019-07-15 ENCOUNTER — Encounter: Payer: Self-pay | Admitting: Family Medicine

## 2019-07-15 ENCOUNTER — Other Ambulatory Visit: Payer: Self-pay

## 2019-07-15 ENCOUNTER — Ambulatory Visit (INDEPENDENT_AMBULATORY_CARE_PROVIDER_SITE_OTHER): Payer: BC Managed Care – PPO | Admitting: Family Medicine

## 2019-07-15 VITALS — BP 164/100 | HR 94 | Temp 99.0°F | Resp 18 | Ht 74.0 in | Wt 264.0 lb

## 2019-07-15 DIAGNOSIS — F411 Generalized anxiety disorder: Secondary | ICD-10-CM | POA: Diagnosis not present

## 2019-07-15 DIAGNOSIS — Z0001 Encounter for general adult medical examination with abnormal findings: Secondary | ICD-10-CM

## 2019-07-15 DIAGNOSIS — R030 Elevated blood-pressure reading, without diagnosis of hypertension: Secondary | ICD-10-CM

## 2019-07-15 DIAGNOSIS — Z0000 Encounter for general adult medical examination without abnormal findings: Secondary | ICD-10-CM

## 2019-07-15 DIAGNOSIS — I480 Paroxysmal atrial fibrillation: Secondary | ICD-10-CM

## 2019-07-15 MED ORDER — BUSPIRONE HCL 10 MG PO TABS: 10.0000 mg | ORAL_TABLET | Freq: Two times a day (BID) | ORAL | 2 refills | Status: DC

## 2019-07-15 MED ORDER — AMLODIPINE BESYLATE 5 MG PO TABS: 5.0000 mg | ORAL_TABLET | Freq: Every day | ORAL | 2 refills | Status: DC

## 2019-07-15 NOTE — Progress Notes (Signed)
Subjective:    Patient ID: Jason Fleming, male    DOB: 02/11/1961, 58 y.o.   MRN: [id number]  HPI  Patient is here today for complete physical exam however his primary concern is his anxiety.  He has a history of generalized anxiety disorder.  The patient has been on venlafaxine 150 mg p.o. every morning now since 2015.  This medication has worked well for him although he does occasionally have anxiety.  He states that at least once a week he would have some anxiety particular early in the morning but this was manageable.  Over the last 3 to 4 months however the anxiety has become more serious.  He states that 3 to 4 days a week now he feels extremely anxious for no reason.  He has a constant feeling of something bad is about to happen.  He knows that rationally everything is okay but he is still unable to control the anxiety.  He denies any full-blown panic attacks however he reports a constant uneasy feeling.  He denies any worsening depression.  He denies any insomnia or anhedonia.  He denies any suicidal thoughts.  His last colonoscopy was performed when he was around the age of 48.  He is good until age 71.  He declines immunizations today.  We recommended a flu shot but the patient declined.  His most recent lab work is listed below.  His blood pressure today is extremely high. Lab on 07/09/2019  Component Date Value Ref Range Status  . WBC 07/09/2019 4.5  3.8 - 10.8 Thousand/uL Final  . RBC 07/09/2019 4.80  4.20 - 5.80 Million/uL Final  . Hemoglobin 07/09/2019 13.6  13.2 - 17.1 g/dL Final  . HCT 07/09/2019 41.1  38.5 - 50.0 % Final  . MCV 07/09/2019 85.6  80.0 - 100.0 fL Final  . MCH 07/09/2019 28.3  27.0 - 33.0 pg Final  . MCHC 07/09/2019 33.1  32.0 - 36.0 g/dL Final  . RDW 07/09/2019 13.2  11.0 - 15.0 % Final  . Platelets 07/09/2019 211  140 - 400 Thousand/uL Final  . MPV 07/09/2019 10.8  7.5 - 12.5 fL Final  . Neutro Abs 07/09/2019 2,007  1,500 - 7,800 cells/uL Final  . Lymphs Abs  07/09/2019 1,674  850 - 3,900 cells/uL Final  . Absolute Monocytes 07/09/2019 567  200 - 950 cells/uL Final  . Eosinophils Absolute 07/09/2019 162  15 - 500 cells/uL Final  . Basophils Absolute 07/09/2019 90  0 - 200 cells/uL Final  . Neutrophils Relative % 07/09/2019 44.6  % Final  . Total Lymphocyte 07/09/2019 37.2  % Final  . Monocytes Relative 07/09/2019 12.6  % Final  . Eosinophils Relative 07/09/2019 3.6  % Final  . Basophils Relative 07/09/2019 2.0  % Final  . Cholesterol 07/09/2019 210* <200 mg/dL Final  . HDL 07/09/2019 41  > OR = 40 mg/dL Final  . Triglycerides 07/09/2019 191* <150 mg/dL Final  . LDL Cholesterol (Calc) 07/09/2019 136* mg/dL (calc) Final   Comment: Reference range: <100 . Desirable range <100 mg/dL for primary prevention;   <70 mg/dL for patients with CHD or diabetic patients  with > or = 2 CHD risk factors. Marland Kitchen LDL-C is now calculated using the Martin-Hopkins  calculation, which is a validated novel method providing  better accuracy than the Friedewald equation in the  estimation of LDL-C.  Cresenciano Genre et al. Annamaria Helling. 6301;601(09): 2061-2068  (http://education.[facility].com/faq/FAQ164)   . Total CHOL/HDL Ratio 07/09/2019 5.1* <5.0 (calc) Final  .  Non-HDL Cholesterol (Calc) 07/09/2019 169* <130 mg/dL (calc) Final   Comment: For patients with diabetes plus 1 major ASCVD risk  factor, treating to a non-HDL-C goal of <100 mg/dL  (LDL-C of <70 mg/dL) is considered a therapeutic  option.   Marland Kitchen PSA 07/09/2019 0.3  < OR = 4.0 ng/mL Final   Comment: The total PSA value from this assay system is  standardized against the WHO standard. The test  result will be approximately 20% lower when compared  to the equimolar-standardized total PSA (Beckman  Coulter). Comparison of serial PSA results should be  interpreted with this fact in mind. . This test was performed using the Siemens  chemiluminescent method. Values obtained from  different assay methods cannot be  used interchangeably. PSA levels, regardless of value, should not be interpreted as absolute evidence of the presence or absence of disease.    Past Medical History:  Diagnosis Date  . Anxiety   . Depression   . Hypertension   . Paroxysmal atrial fibrillation ([place])    cardioversion in ed 05/2018   Past Surgical History:  Procedure Laterality Date  . CARPAL TUNNEL RELEASE    . MASS EXCISION  05/26/2012   Procedure: MINOR EXCISION OF MASS;  Surgeon: Cammie Sickle., MD;  Location: [facility];  Service: Orthopedics;  Laterality: Left;  . TONSILLECTOMY    . WISDOM TOOTH EXTRACTION     Current Outpatient Medications on File Prior to Visit  Medication Sig Dispense Refill  . venlafaxine XR (EFFEXOR XR) 150 MG 24 hr capsule Take 1 capsule (150 mg total) by mouth daily with breakfast. 90 capsule 3   No current facility-administered medications on file prior to visit.    No Known Allergies Social History   Socioeconomic History  . Marital status: Married    Spouse name: Not on file  . Number of children: Not on file  . Years of education: Not on file  . Highest education level: Not on file  Occupational History  . Not on file  Social Needs  . Financial resource strain: Not on file  . Food insecurity    Worry: Not on file    Inability: Not on file  . Transportation needs    Medical: Not on file    Non-medical: Not on file  Tobacco Use  . Smoking status: Current Every Day Smoker    Types: E-cigarettes    Last attempt to quit: 03/02/2012    Years since quitting: 7.3  . Smokeless tobacco: Never Used  Substance and Sexual Activity  . Alcohol use: No  . Drug use: No  . Sexual activity: Not on file  Lifestyle  . Physical activity    Days per week: Not on file    Minutes per session: Not on file  . Stress: Not on file  Relationships  . Social Herbalist on phone: Not on file    Gets together: Not on file    Attends religious service: Not on file     Active member of club or organization: Not on file    Attends meetings of clubs or organizations: Not on file    Relationship status: Not on file  . Intimate partner violence    Fear of current or ex partner: Not on file    Emotionally abused: Not on file    Physically abused: Not on file    Forced sexual activity: Not on file  Other Topics Concern  .  Not on file  Social History Narrative  . Not on file   Family History  Problem Relation Age of Onset  . Anxiety disorder Mother   . Hypertension Mother   . Depression Father   . Heart disease Father   . Alcohol abuse Maternal Uncle   . Alcohol abuse Paternal Uncle   . Stroke Maternal Grandfather      Review of Systems  All other systems reviewed and are negative.      Objective:   Physical Exam Vitals signs reviewed.  Constitutional:      General: He is not in acute distress.    Appearance: Normal appearance. He is obese. He is not ill-appearing, toxic-appearing or diaphoretic.  HENT:     Head: Normocephalic and atraumatic.     Right Ear: Tympanic membrane, ear canal and external ear normal. There is no impacted cerumen.     Left Ear: Tympanic membrane, ear canal and external ear normal. There is no impacted cerumen.     Nose: Nose normal. No congestion or rhinorrhea.     Mouth/Throat:     Mouth: Mucous membranes are moist.     Pharynx: No oropharyngeal exudate or posterior oropharyngeal erythema.  Eyes:     General: No scleral icterus.       Right eye: No discharge.        Left eye: No discharge.     Conjunctiva/sclera: Conjunctivae normal.     Pupils: Pupils are equal, round, and reactive to light.  Neck:     Musculoskeletal: Normal range of motion and neck supple. No neck rigidity or muscular tenderness.     Vascular: No carotid bruit.  Cardiovascular:     Rate and Rhythm: Normal rate and regular rhythm.     Pulses: Normal pulses.     Heart sounds: Normal heart sounds. No murmur. No friction rub. No gallop.    Pulmonary:     Effort: Pulmonary effort is normal. No respiratory distress.     Breath sounds: Normal breath sounds. No stridor. No wheezing, rhonchi or rales.  Chest:     Chest wall: No tenderness.  Abdominal:     General: Bowel sounds are normal. There is no distension.     Palpations: Abdomen is soft. There is no mass.     Tenderness: There is no abdominal tenderness. There is no right CVA tenderness, left CVA tenderness, guarding or rebound.     Hernia: No hernia is present.  Musculoskeletal: Normal range of motion.        General: No tenderness, deformity or signs of injury.     Right lower leg: No edema.     Left lower leg: No edema.  Lymphadenopathy:     Cervical: No cervical adenopathy.  Skin:    General: Skin is warm and dry.     Coloration: Skin is not jaundiced or pale.     Findings: No bruising, erythema, lesion or rash.  Neurological:     General: No focal deficit present.     Mental Status: He is alert and oriented to person, place, and time. Mental status is at baseline.     Cranial Nerves: No cranial nerve deficit.     Sensory: No sensory deficit.     Motor: No weakness.     Coordination: Coordination normal.     Gait: Gait normal.     Deep Tendon Reflexes: Reflexes normal.  Psychiatric:        Mood and Affect: Mood normal.  Behavior: Behavior normal.        Thought Content: Thought content normal.        Judgment: Judgment normal.           Assessment & Plan:  1. Elevated blood pressure reading Patient states that his blood pressure at home has been better than this however it still been greater than 735 systolic.  He attributes this to the anxiety however I believe the patient has essential hypertension.  I have recommended adding amlodipine 5 mg p.o. daily and then reassessing blood pressure in 2 weeks. - COMPLETE METABOLIC PANEL WITH GFR  2. Paroxysmal atrial fibrillation ([facility]) Patient is in normal sinus rhythm.  3. GAD (generalized  anxiety disorder) We will continue Effexor XR 150 mg p.o. every morning but we will add BuSpar 10 mg p.o. twice daily and reassess in 4 weeks how the patient is doing.  4. General medical exam Cholesterol is elevated.  Ideally would like his LDL cholesterol to be below 100.  Patient refuses statin medication.  He would like to work on diet exercise and weight loss and then reassess his cholesterol in 3 months.  Colonoscopy is up-to-date.  PSA is outstanding.  Patient declines a flu shot today.

## 2019-07-16 ENCOUNTER — Encounter: Payer: Self-pay | Admitting: *Deleted

## 2019-07-16 LAB — COMPLETE METABOLIC PANEL WITH GFR
AG Ratio: 1.9 (calc) (ref 1.0–2.5)
ALT: 34 U/L (ref 9–46)
AST: 27 U/L (ref 10–35)
Albumin: 4.4 g/dL (ref 3.6–5.1)
Alkaline phosphatase (APISO): 48 U/L (ref 35–144)
BUN: 7 mg/dL (ref 7–25)
CO2: 27 mmol/L (ref 20–32)
Calcium: 9.3 mg/dL (ref 8.6–10.3)
Chloride: 103 mmol/L (ref 98–110)
Creat: 0.91 mg/dL (ref 0.70–1.33)
GFR, Est African American: 108 mL/min/{1.73_m2} (ref >=60)
GFR, Est Non African American: 93 mL/min/{1.73_m2} (ref >=60)
Globulin: 2.3 g/dL (calc) (ref 1.9–3.7)
Glucose, Bld: 130 mg/dL — ABNORMAL HIGH (ref 65–99)
Potassium: 3.5 mmol/L (ref 3.5–5.3)
Sodium: 140 mmol/L (ref 135–146)
Total Bilirubin: 0.5 mg/dL (ref 0.2–1.2)
Total Protein: 6.7 g/dL (ref 6.1–8.1)

## 2019-10-02 ENCOUNTER — Other Ambulatory Visit: Payer: Self-pay | Admitting: Family Medicine

## 2019-11-13 ENCOUNTER — Other Ambulatory Visit: Payer: Self-pay | Admitting: Family Medicine

## 2019-12-23 ENCOUNTER — Other Ambulatory Visit: Payer: Self-pay | Admitting: Family Medicine

## 2020-03-01 ENCOUNTER — Other Ambulatory Visit: Payer: Self-pay | Admitting: Family Medicine

## 2020-05-23 ENCOUNTER — Ambulatory Visit (HOSPITAL_COMMUNITY)
Admission: RE | Admit: 2020-05-23 | Discharge: 2020-05-23 | Disposition: A | Discharge status: Home / Self Care | Payer: BC Managed Care – PPO | Source: Ambulatory Visit | Attending: Nurse Practitioner | Admitting: Nurse Practitioner

## 2020-05-23 ENCOUNTER — Ambulatory Visit: Payer: BC Managed Care – PPO | Admitting: Nurse Practitioner

## 2020-05-23 ENCOUNTER — Other Ambulatory Visit: Payer: Self-pay

## 2020-05-23 VITALS — BP 144/90 | HR 84 | Temp 97.6°F | Resp 18 | Wt 249.8 lb

## 2020-05-23 DIAGNOSIS — S6991XA Unspecified injury of right wrist, hand and finger(s), initial encounter: Secondary | ICD-10-CM | POA: Diagnosis not present

## 2020-05-23 DIAGNOSIS — S6701XA Crushing injury of right thumb, initial encounter: Secondary | ICD-10-CM

## 2020-05-23 DIAGNOSIS — M79644 Pain in right finger(s): Secondary | ICD-10-CM | POA: Diagnosis not present

## 2020-05-23 NOTE — Progress Notes (Signed)
There is no evidence of fracture or dislocation. He may take over the counter medication such as tylenol and ibuprofen for sxs. His sxs may take several more weeks to resolve. He may use ice or heat in addition for sxs relief. Immobilization to protect for moving in position of discomfort could also speed recovery. Please do not hesitate to follow up for non resolving or worsening sxs.

## 2020-05-23 NOTE — Patient Instructions (Signed)
Get xray today will call with results and treatment plan/directions  May take over the counter medications such as tylenol/ibuprofen for sxs relief  May apply ice for sxs relief  Try to immobilize the thumb

## 2020-05-23 NOTE — Progress Notes (Signed)
Acute Office Visit  Subjective:    Patient ID: Jason Fleming, male    DOB: 05/28/1961, 59 y.o.   MRN: [id number]  Chief Complaint  Patient presents with  . Hand Pain    R, no swelling, started x2 months when pt hit it    HPI Patient is in today for injury of hitting or bumping into an object that he can not exactly recall mechanism of injury but recalls he injured about 1 month ago. He recalls that the right thumb immediately had sever pain and for about a week swelling and since then has continued to guard from putting pressure our moving the thumb as this exacerbated striking pain. There is a continuous dull pain. He has not tried treatment other than guarding the thumb from movements if possible. He would like xray. He denied redness, swelling, open skin, immobility to move the affected area.  Past Medical History:  Diagnosis Date  . Anxiety   . Depression   . Hypertension   . Paroxysmal atrial fibrillation ([place])    cardioversion in ed 05/2018    Past Surgical History:  Procedure Laterality Date  . CARPAL TUNNEL RELEASE    . MASS EXCISION  05/26/2012   Procedure: MINOR EXCISION OF MASS;  Surgeon: Cammie Sickle., MD;  Location: [place];  Service: Orthopedics;  Laterality: Left;  . TONSILLECTOMY    . WISDOM TOOTH EXTRACTION      Family History  Problem Relation Age of Onset  . Anxiety disorder Mother   . Hypertension Mother   . Depression Father   . Heart disease Father   . Alcohol abuse Maternal Uncle   . Alcohol abuse Paternal Uncle   . Stroke Maternal Grandfather     Social History   Socioeconomic History  . Marital status: Married    Spouse name: Not on file  . Number of children: Not on file  . Years of education: Not on file  . Highest education level: Not on file  Occupational History  . Not on file  Tobacco Use  . Smoking status: Current Every Day Smoker    Types: E-cigarettes    Last attempt to quit: 03/02/2012    Years since  quitting: 8.2  . Smokeless tobacco: Never Used  Substance and Sexual Activity  . Alcohol use: No  . Drug use: No  . Sexual activity: Not on file  Other Topics Concern  . Not on file  Social History Narrative  . Not on file   Social Determinants of Health   Financial Resource Strain:   . Difficulty of Paying Living Expenses:   Food Insecurity:   . Worried About Charity fundraiser in the Last Year:   . Arboriculturist in the Last Year:   Transportation Needs:   . Film/video editor (Medical):   Marland Kitchen Lack of Transportation (Non-Medical):   Physical Activity:   . Days of Exercise per Week:   . Minutes of Exercise per Session:   Stress:   . Feeling of Stress :   Social Connections:   . Frequency of Communication with Friends and Family:   . Frequency of Social Gatherings with Friends and Family:   . Attends Religious Services:   . Active Member of Clubs or Organizations:   . Attends Archivist Meetings:   Marland Kitchen Marital Status:   Intimate Partner Violence:   . Fear of Current or Ex-Partner:   . Emotionally Abused:   .  Physically Abused:   . Sexually Abused:     Outpatient Medications Prior to Visit  Medication Sig Dispense Refill  . amLODipine (NORVASC) 5 MG tablet TAKE 1 TABLET BY MOUTH EVERY DAY 30 tablet 2  . busPIRone (BUSPAR) 10 MG tablet TAKE 1 TABLET BY MOUTH TWICE A DAY 180 tablet 2  . venlafaxine XR (EFFEXOR-XR) 150 MG 24 hr capsule TAKE 1 CAPSULE BY MOUTH  DAILY WITH BREAKFAST 90 capsule 3   No facility-administered medications prior to visit.    No Known Allergies  Review of Systems  All other systems reviewed and are negative.      Objective:    Physical Exam Vitals and nursing note reviewed.  Constitutional:      Appearance: Normal appearance.  HENT:     Head: Normocephalic.     Right Ear: External ear normal.     Left Ear: External ear normal.  Eyes:     Extraocular Movements: Extraocular movements intact.     Conjunctiva/sclera:  Conjunctivae normal.     Pupils: Pupils are equal, round, and reactive to light.  Cardiovascular:     Rate and Rhythm: Normal rate.  Pulmonary:     Effort: Pulmonary effort is normal.  Abdominal:     General: Abdomen is flat. There is no distension.  Musculoskeletal:        General: No swelling. Normal range of motion.     Right hand: Tenderness and bony tenderness present. No swelling, deformity or lacerations. Normal strength. Normal sensation. There is no disruption of two-point discrimination. Normal capillary refill. Normal pulse.       Arms:     Cervical back: Normal range of motion and neck supple.     Comments: tenderness at the Trapezium region of the right thumb  Skin:    General: Skin is warm and dry.     Capillary Refill: Capillary refill takes less than 2 seconds.     Coloration: Skin is not jaundiced.     Findings: No bruising or erythema.  Neurological:     General: No focal deficit present.     Mental Status: He is alert and oriented to person, place, and time.  Psychiatric:        Mood and Affect: Mood normal.        Behavior: Behavior normal.        Thought Content: Thought content normal.        Judgment: Judgment normal.     BP (!) 144/90 (BP Location: Left Arm, Patient Position: Sitting, Cuff Size: Large)   Pulse 84   Temp 97.6 F (36.4 C) (Temporal)   Resp 18   Wt 249 lb 12.8 oz (113.3 kg)   SpO2 100%   BMI 32.07 kg/m  Wt Readings from Last 3 Encounters:  05/23/20 249 lb 12.8 oz (113.3 kg)  07/15/19 264 lb (119.7 kg)  06/12/18 251 lb (113.9 kg)    Health Maintenance Due  Topic Date Due  . Hepatitis C Screening  Never done  . COVID-19 Vaccine (1) Never done  . HIV Screening  Never done    There are no preventive care reminders to display for this patient.   Lab Results  Component Value Date   TSH 1.364 06/09/2018   Lab Results  Component Value Date   WBC 4.5 07/09/2019   HGB 13.6 07/09/2019   HCT 41.1 07/09/2019   MCV 85.6  07/09/2019   PLT 211 07/09/2019   Lab Results  Component Value Date  NA 140 07/15/2019   K 3.5 07/15/2019   CO2 27 07/15/2019   GLUCOSE 130 (H) 07/15/2019   BUN 7 07/15/2019   CREATININE 0.91 07/15/2019   BILITOT 0.5 07/15/2019   ALKPHOS 51 07/19/2014   AST 27 07/15/2019   ALT 34 07/15/2019   PROT 6.7 07/15/2019   ALBUMIN 4.7 07/19/2014   CALCIUM 9.3 07/15/2019   ANIONGAP 8 06/09/2018   Lab Results  Component Value Date   CHOL 210 (H) 07/09/2019   Lab Results  Component Value Date   HDL 41 07/09/2019   Lab Results  Component Value Date   LDLCALC 136 (H) 07/09/2019   Lab Results  Component Value Date   TRIG 191 (H) 07/09/2019   Lab Results  Component Value Date   CHOLHDL 5.1 (H) 07/09/2019   Lab Results  Component Value Date   HGBA1C 5.7 (H) 06/12/2018       Assessment & Plan:   Problem List Items Addressed This Visit    None    Visit Diagnoses    Crushing injury of right thumb, initial encounter    -  Primary   Relevant Orders   DG Finger Thumb Right    Thumb injury about 1 month ago with continue pain with pressure and movement  Get xray today will call with results and treatment plan/directions  May take over the counter medications such as tylenol/ibuprofen for sxs relief  May apply ice for sxs relief  Try to immobilize the thumb   Follow Up: as needed for worsening or non resolving symptoms, will call with results and directions once completed xray today.   Annie Main, FNP

## 2020-08-25 ENCOUNTER — Other Ambulatory Visit: Payer: Self-pay | Admitting: Family Medicine

## 2020-08-28 ENCOUNTER — Other Ambulatory Visit: Payer: Self-pay

## 2020-08-28 MED ORDER — BUSPIRONE HCL 10 MG PO TABS: 10.0000 mg | ORAL_TABLET | Freq: Two times a day (BID) | ORAL | 2 refills | Status: DC

## 2021-07-28 ENCOUNTER — Other Ambulatory Visit: Payer: Self-pay | Admitting: Family Medicine

## 2021-09-13 ENCOUNTER — Other Ambulatory Visit: Payer: Self-pay

## 2021-09-13 ENCOUNTER — Other Ambulatory Visit: Payer: BC Managed Care – PPO

## 2021-09-13 DIAGNOSIS — R7309 Other abnormal glucose: Secondary | ICD-10-CM

## 2021-09-13 DIAGNOSIS — Z13220 Encounter for screening for lipoid disorders: Secondary | ICD-10-CM | POA: Diagnosis not present

## 2021-09-13 DIAGNOSIS — Z1159 Encounter for screening for other viral diseases: Secondary | ICD-10-CM

## 2021-09-13 DIAGNOSIS — Z136 Encounter for screening for cardiovascular disorders: Secondary | ICD-10-CM | POA: Diagnosis not present

## 2021-09-13 DIAGNOSIS — I480 Paroxysmal atrial fibrillation: Secondary | ICD-10-CM

## 2021-09-13 DIAGNOSIS — Z125 Encounter for screening for malignant neoplasm of prostate: Secondary | ICD-10-CM

## 2021-09-14 ENCOUNTER — Encounter: Payer: BC Managed Care – PPO | Admitting: Family Medicine

## 2021-09-14 LAB — COMPLETE METABOLIC PANEL WITH GFR
AG Ratio: 1.9 (calc) (ref 1.0–2.5)
ALT: 18 U/L (ref 9–46)
AST: 18 U/L (ref 10–35)
Albumin: 4.5 g/dL (ref 3.6–5.1)
Alkaline phosphatase (APISO): 53 U/L (ref 35–144)
BUN: 12 mg/dL (ref 7–25)
CO2: 26 mmol/L (ref 20–32)
Calcium: 9.4 mg/dL (ref 8.6–10.3)
Chloride: 102 mmol/L (ref 98–110)
Creat: 0.96 mg/dL (ref 0.70–1.30)
Globulin: 2.4 g/dL (calc) (ref 1.9–3.7)
Glucose, Bld: 85 mg/dL (ref 65–99)
Potassium: 4.6 mmol/L (ref 3.5–5.3)
Sodium: 140 mmol/L (ref 135–146)
Total Bilirubin: 0.8 mg/dL (ref 0.2–1.2)
Total Protein: 6.9 g/dL (ref 6.1–8.1)
eGFR: 91 mL/min/{1.73_m2} (ref >=60)

## 2021-09-14 LAB — CBC WITH DIFFERENTIAL/PLATELET
Absolute Monocytes: 484 cells/uL (ref 200–950)
Basophils Absolute: 92 cells/uL (ref 0–200)
Basophils Relative: 2.1 %
Eosinophils Absolute: 202 cells/uL (ref 15–500)
Eosinophils Relative: 4.6 %
HCT: 43.3 % (ref 38.5–50.0)
Hemoglobin: 13.9 g/dL (ref 13.2–17.1)
Lymphs Abs: 1527 cells/uL (ref 850–3900)
MCH: 28 pg (ref 27.0–33.0)
MCHC: 32.1 g/dL (ref 32.0–36.0)
MCV: 87.1 fL (ref 80.0–100.0)
MPV: 11 fL (ref 7.5–12.5)
Monocytes Relative: 11 %
Neutro Abs: 2094 cells/uL (ref 1500–7800)
Neutrophils Relative %: 47.6 %
Platelets: 198 10*3/uL (ref 140–400)
RBC: 4.97 10*6/uL (ref 4.20–5.80)
RDW: 13.1 % (ref 11.0–15.0)
Total Lymphocyte: 34.7 %
WBC: 4.4 10*3/uL (ref 3.8–10.8)

## 2021-09-14 LAB — PSA: PSA: 0.26 ng/mL (ref <=4.00)

## 2021-09-14 LAB — HEMOGLOBIN A1C
Hgb A1c MFr Bld: 5.4 % of total Hgb (ref <5.7)
Mean Plasma Glucose: 108 mg/dL
eAG (mmol/L): 6 mmol/L

## 2021-09-14 LAB — LIPID PANEL
Cholesterol: 215 mg/dL — ABNORMAL HIGH (ref <200)
HDL: 43 mg/dL (ref >=40)
LDL Cholesterol (Calc): 147 mg/dL (calc) — ABNORMAL HIGH
Non-HDL Cholesterol (Calc): 172 mg/dL (calc) — ABNORMAL HIGH (ref <130)
Total CHOL/HDL Ratio: 5 (calc) — ABNORMAL HIGH (ref <5.0)
Triglycerides: 130 mg/dL (ref <150)

## 2021-09-14 LAB — HEPATITIS C ANTIBODY
Hepatitis C Ab: NONREACTIVE
SIGNAL TO CUT-OFF: 0.09 (ref <1.00)

## 2021-09-17 ENCOUNTER — Ambulatory Visit (INDEPENDENT_AMBULATORY_CARE_PROVIDER_SITE_OTHER): Payer: BC Managed Care – PPO | Admitting: Family Medicine

## 2021-09-17 ENCOUNTER — Other Ambulatory Visit: Payer: Self-pay

## 2021-09-17 ENCOUNTER — Encounter: Payer: Self-pay | Admitting: Family Medicine

## 2021-09-17 VITALS — BP 158/90 | HR 68 | Temp 98.3°F | Resp 18 | Ht 74.0 in | Wt 232.0 lb

## 2021-09-17 DIAGNOSIS — Z0000 Encounter for general adult medical examination without abnormal findings: Secondary | ICD-10-CM | POA: Diagnosis not present

## 2021-09-17 DIAGNOSIS — I499 Cardiac arrhythmia, unspecified: Secondary | ICD-10-CM | POA: Diagnosis not present

## 2021-09-17 DIAGNOSIS — I10 Essential (primary) hypertension: Secondary | ICD-10-CM

## 2021-09-17 DIAGNOSIS — Z1211 Encounter for screening for malignant neoplasm of colon: Secondary | ICD-10-CM | POA: Diagnosis not present

## 2021-09-17 MED ORDER — AMLODIPINE BESYLATE 5 MG PO TABS: 5.0000 mg | ORAL_TABLET | Freq: Every day | ORAL | 3 refills | Status: DC

## 2021-09-17 NOTE — Progress Notes (Signed)
Subjective:     Patient ID: Jason Fleming, male   DOB: 10/03/1961, 60 y.o.   MRN: [id number]  HPI Patient is a 60 year old Caucasian male who is here today for a physical exam.  Past medical history significant for hypertension and paroxysmal atrial fibrillation.  He has not been seen in quite some time.  His blood pressure is elevated today at 158/90.  He also is in an irregularly irregular rhythm today on examination.  He states that he does not feel it at all.  He is overdue for colonoscopy.  His most recent lab work is listed below Lab on 09/13/2021  Component Date Value Ref Range Status   Glucose, Bld 09/13/2021 85  65 - 99 mg/dL Final   Comment: .            Fasting reference interval .    BUN 09/13/2021 12  7 - 25 mg/dL Final   Creat 09/13/2021 0.96  0.70 - 1.30 mg/dL Final   eGFR 09/13/2021 91  > OR = 60 mL/min/1.26m Final   Comment: The eGFR is based on the CKD-EPI 2021 equation. To calculate  the new eGFR from a previous Creatinine or Cystatin C result, go to https://www.kidney.org/professionals/ kdoqi/gfr%5Fcalculator    BUN/Creatinine Ratio 110/93/2355NOT APPLICABLE  6 - 22 (calc) Final   Sodium 09/13/2021 140  135 - 146 mmol/L Final   Potassium 09/13/2021 4.6  3.5 - 5.3 mmol/L Final   Chloride 09/13/2021 102  98 - 110 mmol/L Final   CO2 09/13/2021 26  20 - 32 mmol/L Final   Calcium 09/13/2021 9.4  8.6 - 10.3 mg/dL Final   Total Protein 09/13/2021 6.9  6.1 - 8.1 g/dL Final   Albumin 09/13/2021 4.5  3.6 - 5.1 g/dL Final   Globulin 09/13/2021 2.4  1.9 - 3.7 g/dL (calc) Final   AG Ratio 09/13/2021 1.9  1.0 - 2.5 (calc) Final   Total Bilirubin 09/13/2021 0.8  0.2 - 1.2 mg/dL Final   Alkaline phosphatase (APISO) 09/13/2021 53  35 - 144 U/L Final   AST 09/13/2021 18  10 - 35 U/L Final   ALT 09/13/2021 18  9 - 46 U/L Final   WBC 09/13/2021 4.4  3.8 - 10.8 Thousand/uL Final   RBC 09/13/2021 4.97  4.20 - 5.80 Million/uL Final   Hemoglobin 09/13/2021 13.9  13.2 - 17.1 g/dL  Final   HCT 09/13/2021 43.3  38.5 - 50.0 % Final   MCV 09/13/2021 87.1  80.0 - 100.0 fL Final   MCH 09/13/2021 28.0  27.0 - 33.0 pg Final   MCHC 09/13/2021 32.1  32.0 - 36.0 g/dL Final   RDW 09/13/2021 13.1  11.0 - 15.0 % Final   Platelets 09/13/2021 198  140 - 400 Thousand/uL Final   MPV 09/13/2021 11.0  7.5 - 12.5 fL Final   Neutro Abs 09/13/2021 2,094  1,500 - 7,800 cells/uL Final   Lymphs Abs 09/13/2021 1,527  850 - 3,900 cells/uL Final   Absolute Monocytes 09/13/2021 484  200 - 950 cells/uL Final   Eosinophils Absolute 09/13/2021 202  15 - 500 cells/uL Final   Basophils Absolute 09/13/2021 92  0 - 200 cells/uL Final   Neutrophils Relative % 09/13/2021 47.6  % Final   Total Lymphocyte 09/13/2021 34.7  % Final   Monocytes Relative 09/13/2021 11.0  % Final   Eosinophils Relative 09/13/2021 4.6  % Final   Basophils Relative 09/13/2021 2.1  % Final   Hgb A1c MFr Bld 09/13/2021 5.4  <  5.7 % of total Hgb Final   Comment: For the purpose of screening for the presence of diabetes: . <5.7%       Consistent with the absence of diabetes 5.7-6.4%    Consistent with increased risk for diabetes             (prediabetes) > or =6.5%  Consistent with diabetes . This assay result is consistent with a decreased risk of diabetes. . Currently, no consensus exists regarding use of hemoglobin A1c for diagnosis of diabetes in children. . According to American Diabetes Association (ADA) guidelines, hemoglobin A1c <7.0% represents optimal control in non-pregnant diabetic patients. Different metrics may apply to specific patient populations.  Standards of Medical Care in Diabetes(ADA). .    Mean Plasma Glucose 09/13/2021 108  mg/dL Final   eAG (mmol/L) 09/13/2021 6.0  mmol/L Final   Hepatitis C Ab 09/13/2021 NON-REACTIVE  NON-REACTIVE Final   SIGNAL TO CUT-OFF 09/13/2021 0.09  <1.00 Final   Comment: . HCV antibody was non-reactive. There is no laboratory  evidence of HCV infection. . In most  cases, no further action is required. However, if recent HCV exposure is suspected, a test for HCV RNA (test code 585 [id number]) is suggested. . For additional information please refer to http://education.[facility].com/faq/FAQ22v1 (This link is being provided for informational/ educational purposes only.) .    Cholesterol 09/13/2021 215 (A)  <200 mg/dL Final   HDL 09/13/2021 43  > OR = 40 mg/dL Final   Triglycerides 09/13/2021 130  <150 mg/dL Final   LDL Cholesterol (Calc) 09/13/2021 147 (A)  mg/dL (calc) Final   Comment: Reference range: <100 . Desirable range <100 mg/dL for primary prevention;   <70 mg/dL for patients with CHD or diabetic patients  with > or = 2 CHD risk factors. Marland Kitchen LDL-C is now calculated using the Martin-Hopkins  calculation, which is a validated novel method providing  better accuracy than the Friedewald equation in the  estimation of LDL-C.  Cresenciano Genre et al. Annamaria Helling. 8341;962(22): 2061-2068  (http://education.[facility].com/faq/FAQ164)    Total CHOL/HDL Ratio 09/13/2021 5.0 (A)  <5.0 (calc) Final   Non-HDL Cholesterol (Calc) 09/13/2021 172 (A)  <130 mg/dL (calc) Final   Comment: For patients with diabetes plus 1 major ASCVD risk  factor, treating to a non-HDL-C goal of <100 mg/dL  (LDL-C of <70 mg/dL) is considered a therapeutic  option.    PSA 09/13/2021 0.26  < OR = 4.00 ng/mL Final   Comment: The total PSA value from this assay system is  standardized against the WHO standard. The test  result will be approximately 20% lower when compared  to the equimolar-standardized total PSA (Beckman  Coulter). Comparison of serial PSA results should be  interpreted with this fact in mind. . This test was performed using the Siemens  chemiluminescent method. Values obtained from  different assay methods cannot be used interchangeably. PSA levels, regardless of value, should not be interpreted as absolute evidence of the presence or absence of disease.     Based on the fact that he uses nicotine and his blood pressure, I calculate his 10-year risk of cardiovascular disease to be 21.6% Past Medical History:  Diagnosis Date   Anxiety    Depression    Hypertension    Paroxysmal atrial fibrillation ([place])    cardioversion in ed 05/2018   Past Surgical History:  Procedure Laterality Date   CARPAL TUNNEL RELEASE     MASS EXCISION  05/26/2012   Procedure: MINOR EXCISION OF MASS;  Surgeon:  Cammie Sickle., MD;  Location: [place];  Service: Orthopedics;  Laterality: Left;   TONSILLECTOMY     WISDOM TOOTH EXTRACTION     Current Outpatient Medications on File Prior to Visit  Medication Sig Dispense Refill   venlafaxine XR (EFFEXOR-XR) 150 MG 24 hr capsule TAKE 1 CAPSULE BY MOUTH  DAILY WITH BREAKFAST 90 capsule 3   No current facility-administered medications on file prior to visit.   No Known Allergies Social History   Socioeconomic History   Marital status: Married    Spouse name: Not on file   Number of children: Not on file   Years of education: Not on file   Highest education level: Not on file  Occupational History   Not on file  Tobacco Use   Smoking status: Every Day    Types: E-cigarettes    Last attempt to quit: 03/02/2012    Years since quitting: 9.5   Smokeless tobacco: Never  Substance and Sexual Activity   Alcohol use: No   Drug use: No   Sexual activity: Not on file  Other Topics Concern   Not on file  Social History Narrative   Not on file   Social Determinants of Health   Financial Resource Strain: Not on file  Food Insecurity: Not on file  Transportation Needs: Not on file  Physical Activity: Not on file  Stress: Not on file  Social Connections: Not on file  Intimate Partner Violence: Not on file   Family History  Problem Relation Age of Onset   Anxiety disorder Mother    Hypertension Mother    Depression Father    Heart disease Father    Alcohol abuse Maternal Uncle     Alcohol abuse Paternal Uncle    Stroke Maternal Grandfather    Both father and mother suffer from diabetes.  In the emergency room on Saturday, his blood sugar was elevated however this was a nonfasting sample  Review of Systems  All other systems reviewed and are negative.     Objective:   Physical Exam Vitals reviewed.  Constitutional:      General: He is not in acute distress.    Appearance: Normal appearance. He is well-developed. He is not diaphoretic.  HENT:     Head: Normocephalic and atraumatic.  Eyes:     Conjunctiva/sclera: Conjunctivae normal.     Pupils: Pupils are equal, round, and reactive to light.  Neck:     Thyroid: No thyromegaly.     Vascular: No JVD.  Cardiovascular:     Rate and Rhythm: Normal rate. Rhythm irregular.     Heart sounds: No murmur heard.   No friction rub. No gallop.  Pulmonary:     Effort: Pulmonary effort is normal. No respiratory distress.     Breath sounds: Normal breath sounds. No stridor. No wheezing or rales.  Chest:     Chest wall: No tenderness.  Abdominal:     General: Bowel sounds are normal. There is no distension.     Palpations: Abdomen is soft. There is no mass.     Tenderness: There is no abdominal tenderness. There is no guarding or rebound.     Hernia: No hernia is present.  Musculoskeletal:     Right lower leg: No edema.     Left lower leg: No edema.  Skin:    Findings: No rash.  Neurological:     Mental Status: He is alert and oriented to person, place, and  time.     Cranial Nerves: No cranial nerve deficit.     Motor: No abnormal muscle tone.     Coordination: Coordination normal.     Deep Tendon Reflexes: Reflexes normal.  Psychiatric:        Mood and Affect: Mood normal.        Behavior: Behavior normal.       Assessment:     Colon cancer screening - Plan: Ambulatory referral to Gastroenterology  Irregular heartbeat - Plan: EKG 12-Lead  General medical exam  Benign essential HTN   Plan:   Thankfully, EKG today shows normal sinus rhythm.  He does have very frequent PACs which is explaining the irregular heart rhythm auscultated.  However he is not back in atrial fibrillation.  We will start amlodipine 5 mg a day and recheck blood pressure in 1 month.  Strongly recommended a statin but the patient declined.  He states he will work on abstaining from nicotine.  He would like to recheck his cholesterol in 4 months after working aggressively on diet and lifestyle prior to starting a statin.  Recommended COVID shot, flu shot, and a shingles vaccine.  I will schedule the patient for a colonoscopy.

## 2021-11-27 ENCOUNTER — Encounter: Payer: Self-pay | Admitting: Gastroenterology

## 2021-12-13 ENCOUNTER — Other Ambulatory Visit: Payer: Self-pay | Admitting: Family Medicine

## 2021-12-29 IMAGING — DX DG FINGER THUMB 2+V*R*
3 series · 3 of 3 positions shown · non-contrast
Comparison: February 02, 2015.

CLINICAL DATA: Right thumb pain after injury several weeks ago.

EXAM:
RIGHT THUMB 2+V

[finger ap]
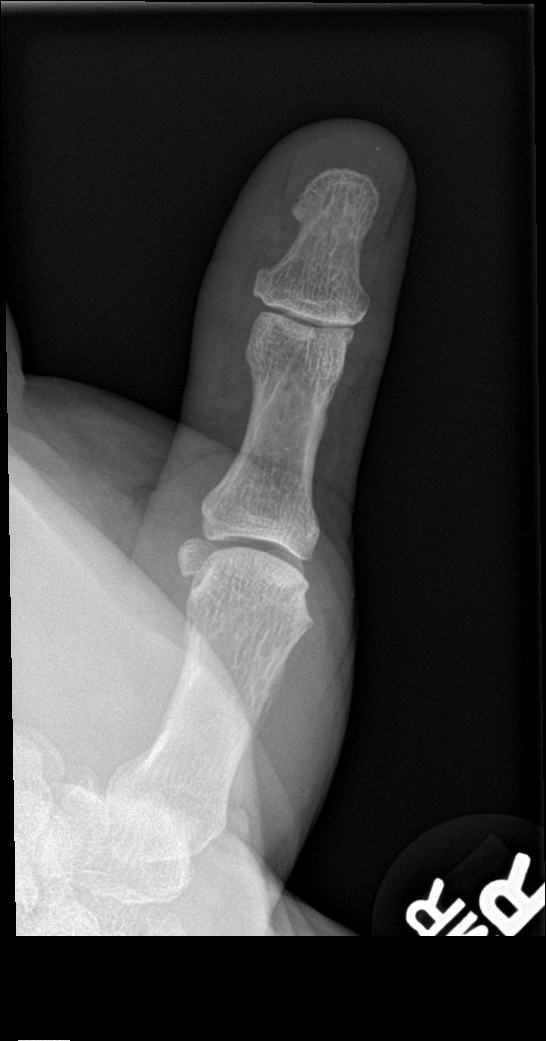

[finger obl]
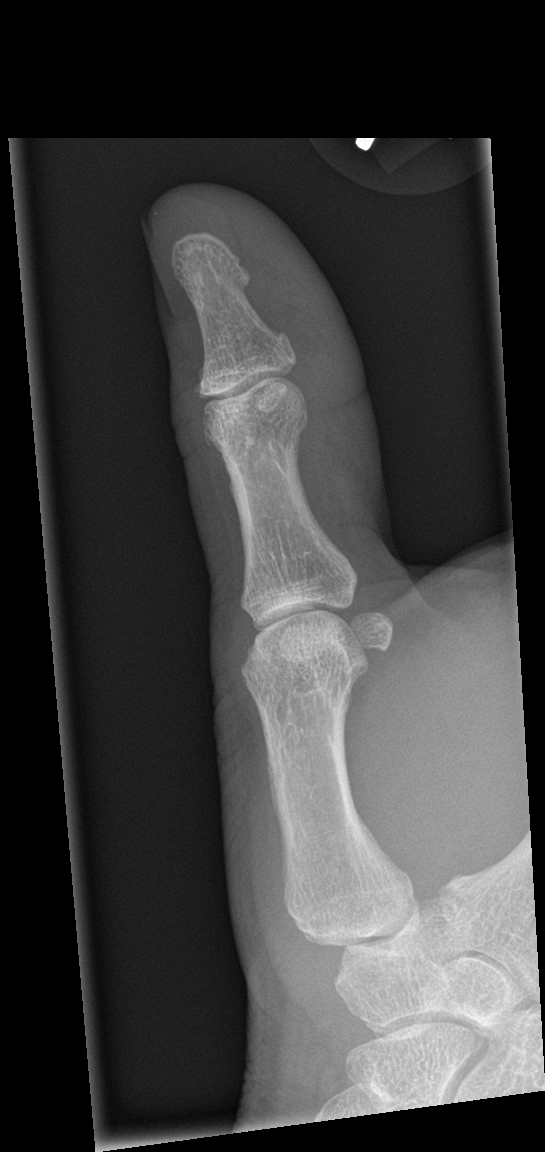

[finger lat]
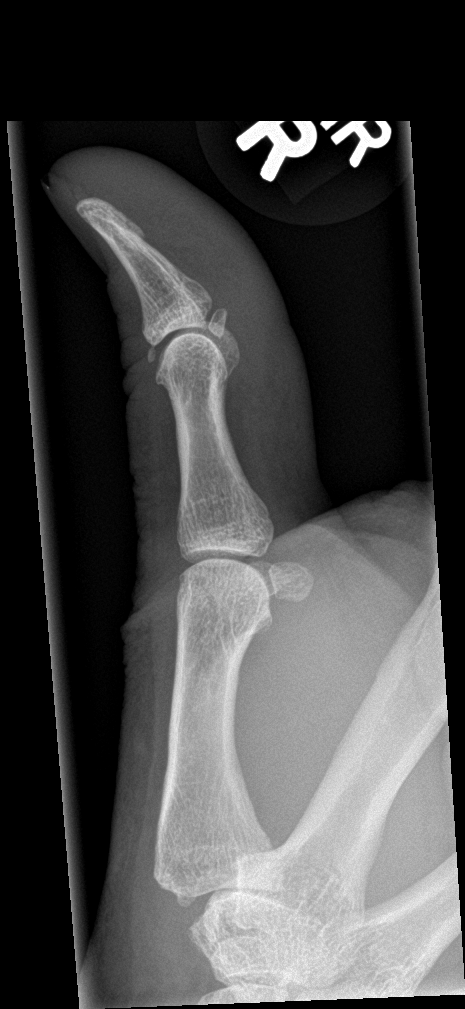

[3 of 3 positions shown; findings below may reference images not displayed]

FINDINGS: There is no evidence of fracture or dislocation. There is no
evidence of arthropathy or other focal bone abnormality. Soft
tissues are unremarkable.
IMPRESSION: Negative.

## 2022-01-09 ENCOUNTER — Other Ambulatory Visit: Payer: Self-pay

## 2022-01-09 ENCOUNTER — Ambulatory Visit (AMBULATORY_SURGERY_CENTER): Payer: BC Managed Care – PPO | Admitting: *Deleted

## 2022-01-09 VITALS — Ht 74.0 in | Wt 232.0 lb

## 2022-01-09 DIAGNOSIS — Z1211 Encounter for screening for malignant neoplasm of colon: Secondary | ICD-10-CM

## 2022-01-09 MED ORDER — NA SULFATE-K SULFATE-MG SULF 17.5-3.13-1.6 GM/177ML PO SOLN: 1.0000 | ORAL | 0 refills | Status: DC

## 2022-01-09 NOTE — Progress Notes (Signed)
Patient's pre-visit was done today over the phone with the patient. Name,DOB and address verified. Patient denies any allergies to Eggs and Soy. Patient denies any problems with anesthesia/sedation. Patient is not taking any diet pills or blood thinners. No home Oxygen.   Prep instructions sent to pt's MyChart-pt aware. Patient understands to call us back with any questions or concerns. Patient is aware of our care-partner policy and [id number] safety protocol.   EMMI education assigned to the patient for the procedure, sent to [facility].

## 2022-01-21 ENCOUNTER — Encounter: Payer: Self-pay | Admitting: Gastroenterology

## 2022-01-23 ENCOUNTER — Ambulatory Visit (AMBULATORY_SURGERY_CENTER): Payer: BC Managed Care – PPO | Admitting: Gastroenterology

## 2022-01-23 ENCOUNTER — Other Ambulatory Visit: Payer: Self-pay

## 2022-01-23 ENCOUNTER — Encounter: Payer: Self-pay | Admitting: Gastroenterology

## 2022-01-23 VITALS — BP 132/74 | HR 67 | Temp 98.7°F | Resp 12 | Ht 74.0 in | Wt 232.0 lb

## 2022-01-23 DIAGNOSIS — K635 Polyp of colon: Secondary | ICD-10-CM

## 2022-01-23 DIAGNOSIS — Z1211 Encounter for screening for malignant neoplasm of colon: Secondary | ICD-10-CM

## 2022-01-23 DIAGNOSIS — D125 Benign neoplasm of sigmoid colon: Secondary | ICD-10-CM

## 2022-01-23 MED ORDER — SODIUM CHLORIDE 0.9 % IV SOLN: 500.0000 mL | Freq: Once | INTRAVENOUS | Status: DC

## 2022-01-23 NOTE — Progress Notes (Signed)
No problems noted in the recovery room. maw 

## 2022-01-23 NOTE — Progress Notes (Signed)
Pt's states no medical or surgical changes since previsit or office visit. 

## 2022-01-23 NOTE — Progress Notes (Signed)
[facility] History and Physical ? ? ?Primary Care Physician:  Susy Frizzle, MD ? ? ?Reason for Procedure:   Colon cancer screening ? ?Plan:    Screening colonoscopy ? ? ? ? ?HPI: Jason Fleming is a 61 y.o. male undergoing average risk screening colonoscopy.  He has no family history of colon cancer and no chronic GI symptoms. He had a normal colonoscopy (per patient) 10 years ago. ? ? ?Past Medical History:  ?Diagnosis Date  ? Anxiety   ? Depression   ? Hypertension   ? Paroxysmal atrial fibrillation ([facility])   ? cardioversion in ed 05/2018  ? ? ?Past Surgical History:  ?Procedure Laterality Date  ? CARPAL TUNNEL RELEASE    ? COLONOSCOPY  2013  ? Dr.Hung-normal exam  ? MASS EXCISION  05/26/2012  ? Procedure: MINOR EXCISION OF MASS;  Surgeon: Cammie Sickle., MD;  Location: [place];  Service: Orthopedics;  Laterality: Left;  ? TONSILLECTOMY    ? WISDOM TOOTH EXTRACTION    ? ? ?Prior to Admission medications   ?Medication Sig Start Date End Date Taking? Authorizing Provider  ?amLODipine (NORVASC) 5 MG tablet Take 1 tablet (5 mg total) by mouth daily. 09/17/21  Yes Susy Frizzle, MD  ?Multiple Vitamin (MULTIVITAMIN) tablet Take 1 tablet by mouth daily.   Yes [provider]  ?Omega-3 Fatty Acids (FISH OIL PO) Take by mouth.   Yes [provider]  ?venlafaxine XR (EFFEXOR-XR) 150 MG 24 hr capsule TAKE 1 CAPSULE BY MOUTH  DAILY WITH BREAKFAST 08/02/21  Yes Susy Frizzle, MD  ? ? ?Current Outpatient Medications  ?Medication Sig Dispense Refill  ? amLODipine (NORVASC) 5 MG tablet Take 1 tablet (5 mg total) by mouth daily. 90 tablet 3  ? Multiple Vitamin (MULTIVITAMIN) tablet Take 1 tablet by mouth daily.    ? Omega-3 Fatty Acids (FISH OIL PO) Take by mouth.    ? venlafaxine XR (EFFEXOR-XR) 150 MG 24 hr capsule TAKE 1 CAPSULE BY MOUTH  DAILY WITH BREAKFAST 90 capsule 3  ? ?Current Facility-Administered Medications  ?Medication Dose Route Frequency Provider Last  Rate Last Admin  ? 0.9 %  sodium chloride infusion  500 mL Intravenous Once Daryel November, MD      ? ? ?Allergies as of 01/23/2022  ? (No Known Allergies)  ? ? ?Family History  ?Problem Relation Age of Onset  ? Anxiety disorder Mother   ? Hypertension Mother   ? Depression Father   ? Heart disease Father   ? Alcohol abuse Maternal Uncle   ? Alcohol abuse Paternal Uncle   ? Stroke Maternal Grandfather   ? Colon cancer Neg Hx   ? Colon polyps Neg Hx   ? Esophageal cancer Neg Hx   ? Stomach cancer Neg Hx   ? Rectal cancer Neg Hx   ? ? ?Social History  ? ?Socioeconomic History  ? Marital status: Married  ?  Spouse name: Not on file  ? Number of children: Not on file  ? Years of education: Not on file  ? Highest education level: Not on file  ?Occupational History  ? Not on file  ?Tobacco Use  ? Smoking status: Every Day  ?  Types: E-cigarettes  ?  Last attempt to quit: 03/02/2012  ?  Years since quitting: 9.9  ? Smokeless tobacco: Never  ?Vaping Use  ? Vaping Use: Every day  ?Substance and Sexual Activity  ? Alcohol use: Yes  ?  Alcohol/week: 3.0 standard drinks  ?  Types: 3 Standard drinks or equivalent per week  ? Drug use: No  ? Sexual activity: Not on file  ?Other Topics Concern  ? Not on file  ?Social History Narrative  ? Not on file  ? ?Social Determinants of Health  ? ?Financial Resource Strain: Not on file  ?Food Insecurity: Not on file  ?Transportation Needs: Not on file  ?Physical Activity: Not on file  ?Stress: Not on file  ?Social Connections: Not on file  ?Intimate Partner Violence: Not on file  ? ? ?Review of Systems: ? ?All other review of systems negative except as mentioned in the HPI. ? ?Physical Exam: ?Vital signs ?BP (!) 144/86   Pulse 77   Temp 98.7 ?F (37.1 ?C)   Ht 6\' 2"  (1.88 m)   Wt 232 lb (105.2 kg)   SpO2 99%   BMI 29.79 kg/m?  ? ?General:   Alert,  Well-developed, well-nourished, pleasant and cooperative in NAD ?Airway:  Mallampati 2 ?Lungs:  Clear throughout to auscultation.    ?Heart:  Regular rate and rhythm; no murmurs, clicks, rubs,  or gallops. ?Abdomen:  Soft, nontender and nondistended. Normal bowel sounds.   ?Neuro/Psych:  Normal mood and affect. A and O x 3 ? ? ?[place] Dani E. Candis Schatz, MD ?[facility] ? ?

## 2022-01-23 NOTE — Patient Instructions (Addendum)
Handouts were given to your care partner on polyps and diverticulosis. ?You may resume your current medications today. ?Await biopsy results.  May take 1-3 weeks to receive pathology results. ?Please call if any questions or concerns. ?  ? ? ?YOU HAD AN ENDOSCOPIC PROCEDURE TODAY AT [facility]:   Refer to the procedure report that was given to you for any specific questions about what was found during the examination.  If the procedure report does not answer your questions, please call your gastroenterologist to clarify.  If you requested that your care partner not be given the details of your procedure findings, then the procedure report has been included in a sealed envelope for you to review at your convenience later. ? ?YOU SHOULD EXPECT: Some feelings of bloating in the abdomen. Passage of more gas than usual.  Walking can help get rid of the air that was put into your GI tract during the procedure and reduce the bloating. If you had a lower endoscopy (such as a colonoscopy or flexible sigmoidoscopy) you may notice spotting of blood in your stool or on the toilet paper. If you underwent a bowel prep for your procedure, you may not have a normal bowel movement for a few days. ? ?Please Note:  You might notice some irritation and congestion in your nose or some drainage.  This is from the oxygen used during your procedure.  There is no need for concern and it should clear up in a day or so. ? ?SYMPTOMS TO REPORT IMMEDIATELY: ? ?Following lower endoscopy (colonoscopy or flexible sigmoidoscopy): ? Excessive amounts of blood in the stool ? Significant tenderness or worsening of abdominal pains ? Swelling of the abdomen that is new, acute ? Fever of 100?F or higher ? ? Black, tarry-looking stools ? ?For urgent or emergent issues, a gastroenterologist can be reached at any hour by calling [phone]. ?Do not use MyChart messaging for urgent concerns.  ? ? ?DIET:  We do recommend a small meal at  first, but then you may proceed to your regular diet.  Drink plenty of fluids but you should avoid alcoholic beverages for 24 hours. ? ?ACTIVITY:  You should plan to take it easy for the rest of today and you should NOT DRIVE or use heavy machinery until tomorrow (because of the sedation medicines used during the test).   ? ?FOLLOW UP: ?Our staff will call the number listed on your records 48-72 hours following your procedure to check on you and address any questions or concerns that you may have regarding the information given to you following your procedure. If we do not reach you, we will leave a message.  We will attempt to reach you two times.  During this call, we will ask if you have developed any symptoms of COVID 19. If you develop any symptoms (ie: fever, flu-like symptoms, shortness of breath, cough etc.) before then, please call [phone].  If you test positive for Covid 19 in the 2 weeks post procedure, please call and report this information to [place].   ? ?If any biopsies were taken you will be contacted by phone or by letter within the next 1-3 weeks.  Please call us at [phone] if you have not heard about the biopsies in 3 weeks.  ? ? ?SIGNATURES/CONFIDENTIALITY: ?You and/or your care partner have signed paperwork which will be entered into your electronic medical record.  These signatures attest to the fact that that the information above on  your After Visit Summary has been reviewed and is understood.  Full responsibility of the confidentiality of this discharge information lies with you and/or your care-partner.  ?

## 2022-01-23 NOTE — Progress Notes (Signed)
Called to room to assist during endoscopic procedure.  Patient ID and intended procedure confirmed with present staff. Received instructions for my participation in the procedure from the performing physician.  

## 2022-01-23 NOTE — Progress Notes (Signed)
A and O x3. Report to RN. Tolerated MAC anesthesia well. 

## 2022-01-23 NOTE — Op Note (Signed)
[name] ?Patient Name: Jason Fleming ?Procedure Date: 01/23/2022 10:25 AM ?MRN: [id number] ?Endoscopist: Aleena Kirkeby E. Candis Schatz , MD ?Age: 61 ?Referring MD:  ?Date of Birth: 03-Dec-1960 ?Gender: Male ?Account #: [id number] ?Procedure:                Colonoscopy ?Indications:              Screening for colorectal malignant neoplasm (last  ?                          colonoscopy was 10 years ago) ?Medicines:                Monitored Anesthesia Care ?Procedure:                Pre-Anesthesia Assessment: ?                          - Prior to the procedure, a History and Physical  ?                          was performed, and patient medications and  ?                          allergies were reviewed. The patient's tolerance of  ?                          previous anesthesia was also reviewed. The risks  ?                          and benefits of the procedure and the sedation  ?                          options and risks were discussed with the patient.  ?                          All questions were answered, and informed consent  ?                          was obtained. Prior Anticoagulants: The patient has  ?                          taken no previous anticoagulant or antiplatelet  ?                          agents. ASA Grade Assessment: II - A patient with  ?                          mild systemic disease. After reviewing the risks  ?                          and benefits, the patient was deemed in  ?                          satisfactory condition to undergo the procedure. ?  After obtaining informed consent, the colonoscope  ?                          was passed under direct vision. Throughout the  ?                          procedure, the patient's blood pressure, pulse, and  ?                          oxygen saturations were monitored continuously. The  ?                          CF [id number] #[id number] was introduced through the anus  ?                          and advanced to the the  terminal ileum, with  ?                          identification of the appendiceal orifice and IC  ?                          valve. The colonoscopy was somewhat difficult due  ?                          to a redundant colon. The patient tolerated the  ?                          procedure well. The quality of the bowel  ?                          preparation was adequate. The terminal ileum,  ?                          ileocecal valve, appendiceal orifice, and rectum  ?                          were photographed. The bowel preparation used was  ?                          SUPREP via split dose instruction. ?Scope In: 10:40:59 AM ?Scope Out: 11:00:03 AM ?Scope Withdrawal Time: 0 hours 14 minutes 32 seconds  ?Total Procedure Duration: 0 hours 19 minutes 4 seconds  ?Findings:                 The perianal and digital rectal examinations were  ?                          normal. Pertinent negatives include normal  ?                          sphincter tone and no palpable rectal lesions. ?                          Multiple small and large-mouthed diverticula were  ?  found in the sigmoid colon, descending colon,  ?                          transverse colon and ascending colon. ?                          A 4 mm polyp was found in the sigmoid colon. The  ?                          polyp was flat. The polyp was removed with a cold  ?                          snare. Resection and retrieval were complete.  ?                          Estimated blood loss was minimal. ?                          The exam was otherwise normal throughout the  ?                          examined colon. ?                          The terminal ileum appeared normal. ?                          The retroflexed view of the distal rectum and anal  ?                          verge was normal and showed no anal or rectal  ?                          abnormalities. ?Complications:            No immediate complications. ?Estimated Blood  Loss:     Estimated blood loss was minimal. ?Impression:               - Diverticulosis in the sigmoid colon, in the  ?                          descending colon, in the transverse colon and in  ?                          the ascending colon. ?                          - One 4 mm polyp in the sigmoid colon, removed with  ?                          a cold snare. Resected and retrieved. ?                          - The examined portion of the ileum was normal. ?                          -  The distal rectum and anal verge are normal on  ?                          retroflexion view. ?Recommendation:           - Patient has a contact number available for  ?                          emergencies. The signs and symptoms of potential  ?                          delayed complications were discussed with the  ?                          patient. Return to normal activities tomorrow.  ?                          Written discharge instructions were provided to the  ?                          patient. ?                          - Resume previous diet. ?                          - Continue present medications. ?                          - Await pathology results. ?                          - Repeat colonoscopy (date not yet determined) for  ?                          surveillance based on pathology results. ?Keeghan Bialy E. Candis Schatz, MD ?01/23/2022 11:04:00 AM ?This report has been signed electronically. ?

## 2022-01-25 ENCOUNTER — Telehealth: Payer: Self-pay | Admitting: *Deleted

## 2022-01-25 NOTE — Telephone Encounter (Signed)
?  Follow up Call- ? ?Call back number 01/23/2022  ?Post procedure Call Back phone  # [phone]  ?Permission to leave phone message Yes  ?Some recent data might be hidden  ?  ? ?Patient questions: ? ?Do you have a fever, pain , or abdominal swelling? No. ?Pain Score  0 * ? ?Have you tolerated food without any problems? Yes.   ? ?Have you been able to return to your normal activities? Yes.   ? ?Do you have any questions about your discharge instructions: ?Diet   No. ?Medications  No. ?Follow up visit  No. ? ?Do you have questions or concerns about your Care? No. ? ?Actions: ?* If pain score is 4 or above: ?No action needed, pain <4. ? ?Have you developed a fever since your procedure? no ? ?2.   Have you had an respiratory symptoms (SOB or cough) since your procedure? no ? ?3.   Have you tested positive for COVID 19 since your procedure no ? ?4.   Have you had any family members/close contacts diagnosed with the COVID 19 since your procedure?  no ? ? ?If yes to any of these questions please route to Joylene John, RN and Joella Prince, RN  ? ? ?

## 2022-01-28 NOTE — Progress Notes (Signed)
Jason Fleming,  ?Good news: the polyp that I removed during your recent examination was NOT precancerous.  You should continue to follow current colorectal cancer screening guidelines with a repeat colonoscopy in 10 years.   ? ?If you develop any new rectal bleeding, abdominal pain or significant bowel habit changes, please contact me before then.   ?

## 2022-02-08 ENCOUNTER — Other Ambulatory Visit: Payer: Self-pay

## 2022-02-08 ENCOUNTER — Ambulatory Visit: Payer: BC Managed Care – PPO | Admitting: Family Medicine

## 2022-02-08 ENCOUNTER — Encounter: Payer: Self-pay | Admitting: Family Medicine

## 2022-02-08 VITALS — BP 128/92 | HR 75 | Temp 97.5°F | Resp 18 | Ht 74.0 in | Wt 232.0 lb

## 2022-02-08 DIAGNOSIS — E7800 Pure hypercholesterolemia, unspecified: Secondary | ICD-10-CM

## 2022-02-08 DIAGNOSIS — I10 Essential (primary) hypertension: Secondary | ICD-10-CM | POA: Diagnosis not present

## 2022-02-08 NOTE — Progress Notes (Signed)
?Subjective:  ?  ? Patient ID: Jason Fleming, male   DOB: 10/06/1961, 61 y.o.   MRN: [id number] ? ?HPI ?At last visit, blood pressure was elevated and I started him on amlodipine.  Cholesterol was elevated and 10 year risk of CVD was > 20%.  Recommended statin but patient declined.  Patient has been checking his blood pressure regularly at home.  His systolic blood pressure is typically around 120.  His diastolic blood pressures in the mid 80s.  Today his blood pressure is elevated but he is nervous in the doctor's office.  He is consistently monitoring his blood pressure at home and his blood pressure at home has been at goal.  He denies any chest pain shortness of breath or dyspnea on exertion.  He is also here to recheck his cholesterol. ?Past Medical History:  ?Diagnosis Date  ? Anxiety   ? Depression   ? Hypertension   ? Paroxysmal atrial fibrillation (HCC)   ? cardioversion in ed 05/2018  ? ?Past Surgical History:  ?Procedure Laterality Date  ? CARPAL TUNNEL RELEASE    ? COLONOSCOPY  2013  ? Dr.Hung-normal exam  ? MASS EXCISION  05/26/2012  ? Procedure: MINOR EXCISION OF MASS;  Surgeon: Cammie Sickle., MD;  Location: [facility];  Service: Orthopedics;  Laterality: Left;  ? TONSILLECTOMY    ? WISDOM TOOTH EXTRACTION    ? ?Current Outpatient Medications on File Prior to Visit  ?Medication Sig Dispense Refill  ? amLODipine (NORVASC) 5 MG tablet Take 1 tablet (5 mg total) by mouth daily. 90 tablet 3  ? Multiple Vitamin (MULTIVITAMIN) tablet Take 1 tablet by mouth daily.    ? Omega-3 Fatty Acids (FISH OIL PO) Take by mouth.    ? venlafaxine XR (EFFEXOR-XR) 150 MG 24 hr capsule TAKE 1 CAPSULE BY MOUTH  DAILY WITH BREAKFAST 90 capsule 3  ? ?No current facility-administered medications on file prior to visit.  ? ?No Known Allergies ?Social History  ? ?Socioeconomic History  ? Marital status: Married  ?  Spouse name: Not on file  ? Number of children: Not on file  ? Years of education: Not on file  ?  Highest education level: Not on file  ?Occupational History  ? Not on file  ?Tobacco Use  ? Smoking status: Every Day  ?  Types: E-cigarettes  ?  Last attempt to quit: 03/02/2012  ?  Years since quitting: 9.9  ? Smokeless tobacco: Never  ?Vaping Use  ? Vaping Use: Every day  ?Substance and Sexual Activity  ? Alcohol use: Yes  ?  Alcohol/week: 3.0 standard drinks  ?  Types: 3 Standard drinks or equivalent per week  ? Drug use: No  ? Sexual activity: Not on file  ?Other Topics Concern  ? Not on file  ?Social History Narrative  ? Not on file  ? ?Social Determinants of Health  ? ?Financial Resource Strain: Not on file  ?Food Insecurity: Not on file  ?Transportation Needs: Not on file  ?Physical Activity: Not on file  ?Stress: Not on file  ?Social Connections: Not on file  ?Intimate Partner Violence: Not on file  ? ?Family History  ?Problem Relation Age of Onset  ? Anxiety disorder Mother   ? Hypertension Mother   ? Depression Father   ? Heart disease Father   ? Alcohol abuse Maternal Uncle   ? Alcohol abuse Paternal Uncle   ? Stroke Maternal Grandfather   ? Colon cancer Neg  Hx   ? Colon polyps Neg Hx   ? Esophageal cancer Neg Hx   ? Stomach cancer Neg Hx   ? Rectal cancer Neg Hx   ? ?Both father and mother suffer from diabetes.  In the emergency room on Saturday, his blood sugar was elevated however this was a nonfasting sample ? ?Review of Systems  ?All other systems reviewed and are negative. ? ?   ?Objective:  ? Physical Exam ?Vitals reviewed.  ?Constitutional:   ?   General: He is not in acute distress. ?   Appearance: Normal appearance. He is well-developed. He is not diaphoretic.  ?HENT:  ?   Head: Normocephalic and atraumatic.  ?Eyes:  ?   Conjunctiva/sclera: Conjunctivae normal.  ?   Pupils: Pupils are equal, round, and reactive to light.  ?Neck:  ?   Thyroid: No thyromegaly.  ?   Vascular: No JVD.  ?Cardiovascular:  ?   Rate and Rhythm: Normal rate and regular rhythm.  ?   Heart sounds: No murmur heard. ?  No  friction rub. No gallop.  ?Pulmonary:  ?   Effort: Pulmonary effort is normal. No respiratory distress.  ?   Breath sounds: Normal breath sounds. No stridor. No wheezing or rales.  ?Chest:  ?   Chest wall: No tenderness.  ?Abdominal:  ?   General: Bowel sounds are normal. There is no distension.  ?   Palpations: Abdomen is soft. There is no mass.  ?   Tenderness: There is no abdominal tenderness. There is no guarding or rebound.  ?   Hernia: No hernia is present.  ?Musculoskeletal:  ?   Right lower leg: No edema.  ?   Left lower leg: No edema.  ?Skin: ?   Findings: No rash.  ?Neurological:  ?   Mental Status: He is alert and oriented to person, place, and time.  ?   Cranial Nerves: No cranial nerve deficit.  ?   Motor: No abnormal muscle tone.  ?   Coordination: Coordination normal.  ?   Deep Tendon Reflexes: Reflexes normal.  ?Psychiatric:     ?   Mood and Affect: Mood normal.     ?   Behavior: Behavior normal.  ? ? ?   ?Assessment:  ?   ?Benign essential HTN - Plan: CBC with Differential/Platelet, Lipid panel, COMPLETE METABOLIC PANEL WITH GFR ? ?Pure hypercholesterolemia - Plan: CBC with Differential/Platelet, Lipid panel, COMPLETE METABOLIC PANEL WITH GFR ? ? ? ?Plan:  ? ?I am very happy with his home blood pressures.  We will not make any changes in his amlodipine.  I will repeat a CMP and a lipid panel.  If his LDL cholesterol remains elevated I will try to convince the patient to try statin. ?

## 2022-02-09 LAB — CBC WITH DIFFERENTIAL/PLATELET
Absolute Monocytes: 556 cells/uL (ref 200–950)
Basophils Absolute: 92 cells/uL (ref 0–200)
Basophils Relative: 1.7 %
Eosinophils Absolute: 329 cells/uL (ref 15–500)
Eosinophils Relative: 6.1 %
HCT: 39.9 % (ref 38.5–50.0)
Hemoglobin: 13.1 g/dL — ABNORMAL LOW (ref 13.2–17.1)
Lymphs Abs: 1901 cells/uL (ref 850–3900)
MCH: 28.2 pg (ref 27.0–33.0)
MCHC: 32.8 g/dL (ref 32.0–36.0)
MCV: 85.8 fL (ref 80.0–100.0)
MPV: 11.4 fL (ref 7.5–12.5)
Monocytes Relative: 10.3 %
Neutro Abs: 2522 cells/uL (ref 1500–7800)
Neutrophils Relative %: 46.7 %
Platelets: 210 10*3/uL (ref 140–400)
RBC: 4.65 10*6/uL (ref 4.20–5.80)
RDW: 13.4 % (ref 11.0–15.0)
Total Lymphocyte: 35.2 %
WBC: 5.4 10*3/uL (ref 3.8–10.8)

## 2022-02-09 LAB — COMPLETE METABOLIC PANEL WITH GFR
AG Ratio: 1.7 (calc) (ref 1.0–2.5)
ALT: 22 U/L (ref 9–46)
AST: 22 U/L (ref 10–35)
Albumin: 4.7 g/dL (ref 3.6–5.1)
Alkaline phosphatase (APISO): 57 U/L (ref 35–144)
BUN: 11 mg/dL (ref 7–25)
CO2: 31 mmol/L (ref 20–32)
Calcium: 9.6 mg/dL (ref 8.6–10.3)
Chloride: 106 mmol/L (ref 98–110)
Creat: 1.01 mg/dL (ref 0.70–1.35)
Globulin: 2.8 g/dL (calc) (ref 1.9–3.7)
Glucose, Bld: 96 mg/dL (ref 65–99)
Potassium: 5.2 mmol/L (ref 3.5–5.3)
Sodium: 143 mmol/L (ref 135–146)
Total Bilirubin: 0.5 mg/dL (ref 0.2–1.2)
Total Protein: 7.5 g/dL (ref 6.1–8.1)
eGFR: 85 mL/min/{1.73_m2} (ref >=60)

## 2022-02-09 LAB — LIPID PANEL
Cholesterol: 217 mg/dL — ABNORMAL HIGH (ref <200)
HDL: 51 mg/dL (ref >=40)
LDL Cholesterol (Calc): 140 mg/dL (calc) — ABNORMAL HIGH
Non-HDL Cholesterol (Calc): 166 mg/dL (calc) — ABNORMAL HIGH (ref <130)
Total CHOL/HDL Ratio: 4.3 (calc) (ref <5.0)
Triglycerides: 139 mg/dL (ref <150)

## 2022-02-13 ENCOUNTER — Other Ambulatory Visit: Payer: Self-pay

## 2022-02-13 MED ORDER — ROSUVASTATIN CALCIUM 10 MG PO TABS: 10.0000 mg | ORAL_TABLET | Freq: Every day | ORAL | 3 refills | Status: DC

## 2022-02-28 ENCOUNTER — Other Ambulatory Visit: Payer: Self-pay | Admitting: Family Medicine

## 2022-02-28 NOTE — Telephone Encounter (Signed)
Buspar is not on patient's current med list, per chart. ? ?The original prescription was discontinued on 09/17/2021 by Six, Christina H, LPN. Renewing this prescription may not be appropriate. ? ?Please advise on rx request, thanks! ?

## 2022-05-17 ENCOUNTER — Other Ambulatory Visit: Payer: BC Managed Care – PPO

## 2022-05-17 DIAGNOSIS — E7800 Pure hypercholesterolemia, unspecified: Secondary | ICD-10-CM

## 2022-05-20 ENCOUNTER — Other Ambulatory Visit: Payer: BC Managed Care – PPO

## 2022-05-20 DIAGNOSIS — E7800 Pure hypercholesterolemia, unspecified: Secondary | ICD-10-CM

## 2022-05-21 LAB — LIPID PANEL
Cholesterol: 149 mg/dL (ref <200)
HDL: 54 mg/dL (ref >=40)
LDL Cholesterol (Calc): 72 mg/dL (calc)
Non-HDL Cholesterol (Calc): 95 mg/dL (calc) (ref <130)
Total CHOL/HDL Ratio: 2.8 (calc) (ref <5.0)
Triglycerides: 149 mg/dL (ref <150)

## 2022-06-20 ENCOUNTER — Other Ambulatory Visit: Payer: Self-pay | Admitting: Family Medicine

## 2022-06-21 NOTE — Telephone Encounter (Signed)
Requested medication (s) are due for refill today:   Yes  Requested medication (s) are on the active medication list:   Yes  Future visit scheduled:   No  Seen 4 months ago   Last ordered: 08/02/2022 #90, 3 refills  Returned because PHQ-9 needed per protocol.   Requested Prescriptions  Pending Prescriptions Disp Refills   venlafaxine XR (EFFEXOR-XR) 150 MG 24 hr capsule [Pharmacy Med Name: Venlafaxine HCl ER 150 MG Oral Capsule Extended Release 24 Hour] 90 capsule 3    Sig: TAKE 1 CAPSULE BY MOUTH  DAILY WITH BREAKFAST     Psychiatry: Antidepressants - SNRI - desvenlafaxine & venlafaxine Failed - 06/20/2022 10:59 PM      Failed - Completed PHQ-2 or PHQ-9 in the last 360 days      Failed - Last BP in normal range    BP Readings from Last 1 Encounters:  02/08/22 (!) 128/92         Failed - Lipid Panel in normal range within the last 12 months    Cholesterol  Date Value Ref Range Status  05/20/2022 149 <200 mg/dL Final   LDL Cholesterol (Calc)  Date Value Ref Range Status  05/20/2022 72 mg/dL (calc) Final    Comment:    Reference range: <100 . Desirable range <100 mg/dL for primary prevention;   <70 mg/dL for patients with CHD or diabetic patients  with > or = 2 CHD risk factors. Marland Kitchen LDL-C is now calculated using the Martin-Hopkins  calculation, which is a validated novel method providing  better accuracy than the Friedewald equation in the  estimation of LDL-C.  Cresenciano Genre et al. Annamaria Helling. 8185;631(49): 2061-2068  (http://education.[facility].com/faq/FAQ164)    HDL  Date Value Ref Range Status  05/20/2022 54 > OR = 40 mg/dL Final   Triglycerides  Date Value Ref Range Status  05/20/2022 149 <150 mg/dL Final         Passed - Cr in normal range and within 360 days    Creat  Date Value Ref Range Status  02/08/2022 1.01 0.70 - 1.35 mg/dL Final         Passed - Valid encounter within last 6 months    Recent Outpatient Visits           4 months ago Benign  essential HTN   [place] Susy Frizzle, MD   9 months ago Colon cancer screening   [place] Susy Frizzle, MD   2 years ago Crushing injury of right thumb, initial encounter   [place], [place], FNP   2 years ago Elevated blood pressure reading   [place], Warren T, MD   4 years ago Paroxysmal atrial fibrillation [facility])   [facility] [place], Cammie Mcgee, MD

## 2022-07-16 ENCOUNTER — Other Ambulatory Visit: Payer: Self-pay | Admitting: Family Medicine

## 2022-11-14 ENCOUNTER — Other Ambulatory Visit: Payer: Self-pay | Admitting: Family Medicine

## 2022-11-14 ENCOUNTER — Ambulatory Visit (INDEPENDENT_AMBULATORY_CARE_PROVIDER_SITE_OTHER): Payer: BC Managed Care – PPO

## 2022-11-14 ENCOUNTER — Ambulatory Visit
Admission: EM | Admit: 2022-11-14 | Discharge: 2022-11-14 | Disposition: A | Discharge status: Home / Self Care | Payer: BC Managed Care – PPO | Attending: Nurse Practitioner | Admitting: Nurse Practitioner

## 2022-11-14 DIAGNOSIS — M25511 Pain in right shoulder: Secondary | ICD-10-CM

## 2022-11-14 MED ORDER — DEXAMETHASONE SODIUM PHOSPHATE 10 MG/ML IJ SOLN
10.0000 mg | Freq: Once | INTRAMUSCULAR | Status: AC
  Administered 2022-11-14: 10 mg via INTRAMUSCULAR

## 2022-11-14 MED ORDER — CYCLOBENZAPRINE HCL 10 MG PO TABS: 10.0000 mg | ORAL_TABLET | Freq: Two times a day (BID) | ORAL | 0 refills | Status: DC | PRN

## 2022-11-14 NOTE — ED Triage Notes (Signed)
Pt reports Monday he began to have soreness in his neck radiates down his shoulder and arm. Doesn't know what caused it. Took aleve, tylenol. And ibuprofen but no relief. Limited ROM in right arm and trying  to move his head straight ahead.

## 2022-11-14 NOTE — Discharge Instructions (Addendum)
The shoulder x-ray today does not show any acute abnormality.  We have given you a shot of steroid to help decrease inflammation in your muscles.  Please also take the Flexeril twice daily as needed for muscle pain.  This might make you sleepy so do not take it with alcohol while operating or driving heavy machinery.

## 2022-11-14 NOTE — ED Provider Notes (Signed)
[facility]    CSN: [id number] Arrival date & time: 11/14/22  1342      History   Chief Complaint Chief Complaint  Patient presents with   Neck Pain    HPI Jason Fleming is a 61 y.o. male.   Patient presents for 4-day history of right shoulder pain.  No recent accident, trauma, or injury to the shoulder.  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  Reports the pain has progressively worsened over the past few days and is rating up into his neck.  The pain is constant in his right shoulder even at rest.  When he moves his arm, the pain is worse.  He is unable to move his arm above the level of his shoulder.  Reports normally, when he raises both of his arms, he feels a "twinge" in his right shoulder. Reports the current pain is diffusely in the shoulder.  The pain does occasionally shoot down to his fingertips, however no numbness or tingling in his fingertips.  No arm weakness, numbness, decreased grip strength, redness, swelling, or bruising.  No fevers or nausea/vomiting since the pain began.  He has taken Aleve for the pain with temporary benefit.    Past Medical History:  Diagnosis Date   Anxiety    Depression    Hypertension    Paroxysmal atrial fibrillation ([place])    cardioversion in ed 05/2018    Patient Active Problem List   Diagnosis Date Noted   Paroxysmal atrial fibrillation [facility])    Depression    Anxiety     Past Surgical History:  Procedure Laterality Date   CARPAL TUNNEL RELEASE     COLONOSCOPY  2013   Dr.Hung-normal exam   MASS EXCISION  05/26/2012   Procedure: MINOR EXCISION OF MASS;  Surgeon: Cammie Sickle., MD;  Location: [place];  Service: Orthopedics;  Laterality: Left;   TONSILLECTOMY     WISDOM TOOTH EXTRACTION         Home Medications    Prior to Admission medications   Medication Sig Start Date End Date Taking?  Authorizing Provider  cyclobenzaprine (FLEXERIL) 10 MG tablet Take 1 tablet (10 mg total) by mouth 2 (two) times daily as needed for muscle spasms. Do not take with alcohol or while driving or operating heavy machinery.  May cause drowsiness. 11/14/22  Yes Noemi Chapel A, NP  amLODipine (NORVASC) 5 MG tablet TAKE 1 TABLET BY MOUTH  DAILY 07/17/22   Susy Frizzle, MD  Multiple Vitamin (MULTIVITAMIN) tablet Take 1 tablet by mouth daily.    [provider]  Omega-3 Fatty Acids (FISH OIL PO) Take by mouth.    [provider]  rosuvastatin (CRESTOR) 10 MG tablet Take 1 tablet (10 mg total) by mouth daily. 02/13/22   Susy Frizzle, MD  venlafaxine XR (EFFEXOR-XR) 150 MG 24 hr capsule TAKE 1 CAPSULE BY MOUTH  DAILY WITH BREAKFAST 06/24/22   Susy Frizzle, MD    Family History Family History  Problem Relation Age of Onset   Anxiety disorder Mother    Hypertension Mother    Depression Father    Heart disease Father    Alcohol abuse Maternal Uncle    Alcohol abuse Paternal Uncle    Stroke Maternal Grandfather    Colon cancer Neg Hx    Colon polyps Neg Hx    Esophageal cancer  Neg Hx    Stomach cancer Neg Hx    Rectal cancer Neg Hx     Social History Social History   Tobacco Use   Smoking status: Every Day    Types: E-cigarettes    Last attempt to quit: 03/02/2012    Years since quitting: 10.7   Smokeless tobacco: Never  Vaping Use   Vaping Use: Every day  Substance Use Topics   Alcohol use: Yes    Alcohol/week: 3.0 standard drinks of alcohol    Types: 3 Standard drinks or equivalent per week   Drug use: No     Allergies   Patient has no known allergies.   Review of Systems Review of Systems Per HPI  Physical Exam Triage Vital Signs ED Triage Vitals  Enc Vitals Group     BP 11/14/22 1532 (!) 159/79     Pulse Rate 11/14/22 1532 83     Resp 11/14/22 1532 20     Temp 11/14/22 1532 98.7 F (37.1 C)     Temp Source 11/14/22 1532 Oral      SpO2 11/14/22 1532 97 %     Weight --      Height --      Head Circumference --      Peak Flow --      Pain Score 11/14/22 1534 9     Pain Loc --      Pain Edu? --      Excl. in [place]? --    No data found.  Updated Vital Signs BP (!) 159/79 (BP Location: Right Arm)   Pulse 83   Temp 98.7 F (37.1 C) (Oral)   Resp 20   SpO2 97%   Visual Acuity Right Eye Distance:   Left Eye Distance:   Bilateral Distance:    Right Eye Near:   Left Eye Near:    Bilateral Near:     Physical Exam Vitals and nursing note reviewed.  Constitutional:      General: He is not in acute distress.    Appearance: Normal appearance. He is not toxic-appearing.  Neck:      Comments: Inspection: No swelling, obvious deformity, redness, or bruising to neck or paraspinal muscles  Palpation: Right neck tender to palpation approximately area marked; no obvious deformities palpated ROM: Full ROM to neck Strength: 5/5 to neck with range of motion  Pulmonary:     Effort: Pulmonary effort is normal. No respiratory distress.  Musculoskeletal:     Right shoulder: Tenderness present. No swelling, deformity or bony tenderness. Normal range of motion. Normal strength. Normal pulse.     Right wrist: Normal. Normal pulse.     Left wrist: Normal. Normal pulse.     Right hand: Normal strength. Normal sensation. There is no disruption of two-point discrimination. Normal capillary refill. Normal pulse.     Left hand: Normal strength. Normal sensation. There is no disruption of two-point discrimination. Normal capillary refill. Normal pulse.       Arms:     Cervical back: Pain with movement and muscular tenderness present. No spinous process tenderness.     Comments: Inspection: No swelling, bruising, obvious deformity, or redness to right upper extremity Palpation: Right upper extremity nontender to palpation; no obvious deformities palpated ROM: Patient unable to raise right arm above head secondary to pain Strength:  5/5 bilateral upper extremities Neurovascular: neurovascularly intact in left and right upper extremity   Lymphadenopathy:     Cervical: No cervical adenopathy.  Skin:  General: Skin is warm and dry.     Capillary Refill: Capillary refill takes less than 2 seconds.     Coloration: Skin is not jaundiced or pale.     Findings: No erythema.  Neurological:     Mental Status: He is alert and oriented to person, place, and time.  Psychiatric:        Behavior: Behavior is cooperative.      UC Treatments / Results  Labs (all labs ordered are listed, but only abnormal results are displayed) Labs Reviewed - No data to display  EKG   Radiology DG Shoulder Right  Result Date: 11/14/2022 CLINICAL DATA:  Right shoulder pain without known injury. EXAM: RIGHT SHOULDER - 2+ VIEW COMPARISON:  None Available. FINDINGS: There is no evidence of fracture or dislocation. There is no evidence of arthropathy or other focal bone abnormality. Soft tissues are unremarkable. IMPRESSION: Negative. Electronically Signed   By: Marijo Conception M.D.   On: 11/14/2022 16:20    Procedures Procedures (including critical care time)  Medications Ordered in UC Medications  dexamethasone (DECADRON) injection 10 mg (10 mg Intramuscular Given 11/14/22 1628)    Initial Impression / Assessment and Plan / UC Course  I have reviewed the triage vital signs and the nursing notes.  Pertinent labs & imaging results that were available during my care of the patient were reviewed by me and considered in my medical decision making (see chart for details).   Patient is well-appearing, afebrile, not tachycardic, not tachypneic, oxygenating well on room air.  He is mildly hypertensive in urgent care today, likely secondary to acute pain  Acute pain of right shoulder Shoulder x-ray today negative for acute process Suspect muscular injury Treat with Decadron 10 mg IM today in urgent care Start muscle relaxant at home as  needed Also encouraged ice, heat, light range of motion and stretching exercises Follow-up with primary care provider if no improvement in symptoms despite treatment  The patient was given the opportunity to ask questions.  All questions answered to their satisfaction.  The patient is in agreement to this plan.    Final Clinical Impressions(s) / UC Diagnoses   Final diagnoses:  Acute pain of right shoulder     Discharge Instructions      The shoulder x-ray today does not show any acute abnormality.  We have given you a shot of steroid to help decrease inflammation in your muscles.  Please also take the Flexeril twice daily as needed for muscle pain.  This might make you sleepy so do not take it with alcohol while operating or driving heavy machinery.     ED Prescriptions     Medication Sig Dispense Auth. Provider   cyclobenzaprine (FLEXERIL) 10 MG tablet Take 1 tablet (10 mg total) by mouth 2 (two) times daily as needed for muscle spasms. Do not take with alcohol or while driving or operating heavy machinery.  May cause drowsiness. 20 tablet Eulogio Bear, NP      PDMP not reviewed this encounter.   Eulogio Bear, NP 11/14/22 [phone]

## 2022-11-15 NOTE — Telephone Encounter (Signed)
PT NEED CPE APPT W/PCP FOR FUTURE REFILLS  

## 2022-11-28 NOTE — Telephone Encounter (Signed)
Office visit scheduled for med refills. Patient will schedule cpe when he checks out the day of the appt.

## 2022-12-06 ENCOUNTER — Encounter: Payer: Self-pay | Admitting: Family Medicine

## 2022-12-06 ENCOUNTER — Ambulatory Visit: Payer: BC Managed Care – PPO | Admitting: Family Medicine

## 2022-12-06 VITALS — BP 142/86 | HR 85 | Ht 74.0 in | Wt 256.0 lb

## 2022-12-06 DIAGNOSIS — Z125 Encounter for screening for malignant neoplasm of prostate: Secondary | ICD-10-CM

## 2022-12-06 DIAGNOSIS — I10 Essential (primary) hypertension: Secondary | ICD-10-CM

## 2022-12-06 NOTE — Progress Notes (Signed)
Subjective:     Patient ID: Jason Fleming, male   DOB: 09-29-1961, 62 y.o.   MRN: [id number]  HPI Patient is a very pleasant 62 year old Caucasian gentleman here today for checkup.  He had a colonoscopy in 2023 over he is due for prostate cancer screening.  He has a history of hypertension and hyperlipidemia.  He is due to recheck his fasting blood work.  Otherwise he is doing well.  He denies any medical concerns outside of some right-sided cervical radiculopathy but he is receiving treatment from a chiropractor for this.  His blood pressure today is elevated.  He has not been checking his blood pressure consistently at home.  He denies any chest pain shortness of breath or dyspnea on exertion Past Medical History:  Diagnosis Date   Anxiety    Depression    Hypertension    Paroxysmal atrial fibrillation ([place])    cardioversion in ed 05/2018   Past Surgical History:  Procedure Laterality Date   CARPAL TUNNEL RELEASE     COLONOSCOPY  2013   Dr.Hung-normal exam   MASS EXCISION  05/26/2012   Procedure: MINOR EXCISION OF MASS;  Surgeon: Cammie Sickle., MD;  Location: [place];  Service: Orthopedics;  Laterality: Left;   TONSILLECTOMY     WISDOM TOOTH EXTRACTION     Current Outpatient Medications on File Prior to Visit  Medication Sig Dispense Refill   amLODipine (NORVASC) 5 MG tablet TAKE 1 TABLET BY MOUTH  DAILY 90 tablet 3   cyclobenzaprine (FLEXERIL) 10 MG tablet Take 1 tablet (10 mg total) by mouth 2 (two) times daily as needed for muscle spasms. Do not take with alcohol or while driving or operating heavy machinery.  May cause drowsiness. 20 tablet 0   Multiple Vitamin (MULTIVITAMIN) tablet Take 1 tablet by mouth daily.     Omega-3 Fatty Acids (FISH OIL PO) Take by mouth.     rosuvastatin (CRESTOR) 10 MG tablet TAKE 1 TABLET BY MOUTH DAILY 30 tablet 0   venlafaxine XR (EFFEXOR-XR) 150 MG 24 hr capsule TAKE 1 CAPSULE BY MOUTH  DAILY WITH BREAKFAST 90 capsule 3    No current facility-administered medications on file prior to visit.   No Known Allergies Social History   Socioeconomic History   Marital status: Married    Spouse name: Not on file   Number of children: Not on file   Years of education: Not on file   Highest education level: Not on file  Occupational History   Not on file  Tobacco Use   Smoking status: Every Day    Types: E-cigarettes    Last attempt to quit: 03/02/2012    Years since quitting: 10.7   Smokeless tobacco: Never  Vaping Use   Vaping Use: Every day  Substance and Sexual Activity   Alcohol use: Yes    Alcohol/week: 3.0 standard drinks of alcohol    Types: 3 Standard drinks or equivalent per week   Drug use: No   Sexual activity: Not on file  Other Topics Concern   Not on file  Social History Narrative   Not on file   Social Determinants of Health   Financial Resource Strain: Not on file  Food Insecurity: Not on file  Transportation Needs: Not on file  Physical Activity: Not on file  Stress: Not on file  Social Connections: Not on file  Intimate Partner Violence: Not on file   Family History  Problem Relation Age of Onset  Anxiety disorder Mother    Hypertension Mother    Depression Father    Heart disease Father    Alcohol abuse Maternal Uncle    Alcohol abuse Paternal Uncle    Stroke Maternal Grandfather    Colon cancer Neg Hx    Colon polyps Neg Hx    Esophageal cancer Neg Hx    Stomach cancer Neg Hx    Rectal cancer Neg Hx    Both father and mother suffer from diabetes.  In the emergency room on Saturday, his blood sugar was elevated however this was a nonfasting sample  Review of Systems  All other systems reviewed and are negative.      Objective:   Physical Exam Vitals reviewed.  Constitutional:      General: He is not in acute distress.    Appearance: Normal appearance. He is well-developed. He is not diaphoretic.  HENT:     Head: Normocephalic and atraumatic.  Eyes:      Conjunctiva/sclera: Conjunctivae normal.     Pupils: Pupils are equal, round, and reactive to light.  Neck:     Thyroid: No thyromegaly.     Vascular: No JVD.  Cardiovascular:     Rate and Rhythm: Normal rate and regular rhythm.     Heart sounds: No murmur heard.    No friction rub. No gallop.  Pulmonary:     Effort: Pulmonary effort is normal. No respiratory distress.     Breath sounds: Normal breath sounds. No stridor. No wheezing or rales.  Chest:     Chest wall: No tenderness.  Abdominal:     General: Bowel sounds are normal. There is no distension.     Palpations: Abdomen is soft. There is no mass.     Tenderness: There is no abdominal tenderness. There is no guarding or rebound.     Hernia: No hernia is present.  Musculoskeletal:     Right lower leg: No edema.     Left lower leg: No edema.  Skin:    Findings: No rash.  Neurological:     Mental Status: He is alert and oriented to person, place, and time.     Cranial Nerves: No cranial nerve deficit.     Motor: No abnormal muscle tone.     Coordination: Coordination normal.     Deep Tendon Reflexes: Reflexes normal.  Psychiatric:        Mood and Affect: Mood normal.        Behavior: Behavior normal.        Assessment:     Benign essential HTN - Plan: CBC with Differential/Platelet, Lipid panel, COMPLETE METABOLIC PANEL WITH GFR  Prostate cancer screening - Plan: PSA, Medicare    Plan:  I have asked the patient to check his blood pressure at home frequently and notify me of the values.  If consistently greater then 140/90 we could either increase his amlodipine or potentially add an angiotensin receptor blocker.  Screen for prostate cancer with PSA.  Check a fasting lipid panel.  Colonoscopy is up-to-date.

## 2022-12-06 NOTE — Addendum Note (Signed)
Addended by: Randal Buba K on: 12/06/2022 02:12 PM   Modules accepted: Orders

## 2022-12-07 LAB — CBC WITH DIFFERENTIAL/PLATELET
Absolute Monocytes: 570 cells/uL (ref 200–950)
Basophils Absolute: 120 cells/uL (ref 0–200)
Basophils Relative: 2.4 %
Eosinophils Absolute: 340 cells/uL (ref 15–500)
Eosinophils Relative: 6.8 %
HCT: 39 % (ref 38.5–50.0)
Hemoglobin: 13.4 g/dL (ref 13.2–17.1)
Lymphs Abs: 1805 cells/uL (ref 850–3900)
MCH: 29.1 pg (ref 27.0–33.0)
MCHC: 34.4 g/dL (ref 32.0–36.0)
MCV: 84.6 fL (ref 80.0–100.0)
MPV: 11.7 fL (ref 7.5–12.5)
Monocytes Relative: 11.4 %
Neutro Abs: 2165 cells/uL (ref 1500–7800)
Neutrophils Relative %: 43.3 %
Platelets: 203 10*3/uL (ref 140–400)
RBC: 4.61 10*6/uL (ref 4.20–5.80)
RDW: 13.2 % (ref 11.0–15.0)
Total Lymphocyte: 36.1 %
WBC: 5 10*3/uL (ref 3.8–10.8)

## 2022-12-07 LAB — COMPLETE METABOLIC PANEL WITH GFR
AG Ratio: 1.9 (calc) (ref 1.0–2.5)
ALT: 35 U/L (ref 9–46)
AST: 27 U/L (ref 10–35)
Albumin: 4.8 g/dL (ref 3.6–5.1)
Alkaline phosphatase (APISO): 54 U/L (ref 35–144)
BUN: 9 mg/dL (ref 7–25)
CO2: 26 mmol/L (ref 20–32)
Calcium: 9.5 mg/dL (ref 8.6–10.3)
Chloride: 103 mmol/L (ref 98–110)
Creat: 1.03 mg/dL (ref 0.70–1.35)
Globulin: 2.5 g/dL (calc) (ref 1.9–3.7)
Glucose, Bld: 93 mg/dL (ref 65–99)
Potassium: 4.5 mmol/L (ref 3.5–5.3)
Sodium: 140 mmol/L (ref 135–146)
Total Bilirubin: 0.6 mg/dL (ref 0.2–1.2)
Total Protein: 7.3 g/dL (ref 6.1–8.1)
eGFR: 83 mL/min/{1.73_m2} (ref >=60)

## 2022-12-07 LAB — LIPID PANEL
Cholesterol: 154 mg/dL (ref <200)
HDL: 53 mg/dL (ref >=40)
LDL Cholesterol (Calc): 77 mg/dL (calc)
Non-HDL Cholesterol (Calc): 101 mg/dL (calc) (ref <130)
Total CHOL/HDL Ratio: 2.9 (calc) (ref <5.0)
Triglycerides: 144 mg/dL (ref <150)

## 2022-12-07 LAB — PSA: PSA: 0.25 ng/mL (ref <=4.00)

## 2023-01-18 ENCOUNTER — Other Ambulatory Visit: Payer: Self-pay | Admitting: Family Medicine

## 2023-05-24 ENCOUNTER — Other Ambulatory Visit: Payer: Self-pay | Admitting: Family Medicine

## 2023-06-16 ENCOUNTER — Other Ambulatory Visit: Payer: Self-pay | Admitting: Family Medicine

## 2023-06-18 NOTE — Telephone Encounter (Signed)
Requested Prescriptions  Pending Prescriptions Disp Refills   amLODipine (NORVASC) 5 MG tablet [Pharmacy Med Name: amLODIPine Besylate 5 MG Oral Tablet] 90 tablet 3    Sig: TAKE 1 TABLET BY MOUTH DAILY     Cardiovascular: Calcium Channel Blockers 2 Failed - 06/16/2023 10:42 PM      Failed - Last BP in normal range    BP Readings from Last 1 Encounters:  12/06/22 (!) 142/86         Failed - Valid encounter within last 6 months    Recent Outpatient Visits           1 year ago Benign essential HTN   [facility] Tanya Nones, Priscille Heidelberg, MD   1 year ago Colon cancer screening   [facility] Donita Brooks, MD   3 years ago Crushing injury of right thumb, initial encounter   [facility] Lawson Fiscal A, FNP   3 years ago Elevated blood pressure reading   [facility] Donita Brooks, MD   5 years ago Paroxysmal atrial fibrillation [facility])   [facility] Pickard, Priscille Heidelberg, MD              Passed - Last Heart Rate in normal range    Pulse Readings from Last 1 Encounters:  12/06/22 85

## 2023-06-23 ENCOUNTER — Other Ambulatory Visit: Payer: Self-pay

## 2023-06-23 ENCOUNTER — Telehealth: Payer: Self-pay | Admitting: Family Medicine

## 2023-06-23 MED ORDER — VENLAFAXINE HCL ER 150 MG PO CP24: 150.0000 mg | ORAL_CAPSULE | Freq: Every day | ORAL | 0 refills | Status: DC

## 2023-06-23 NOTE — Telephone Encounter (Signed)
Pharmacy requesting clarification of the following:     Please advise pharmacist.

## 2023-07-04 ENCOUNTER — Other Ambulatory Visit: Payer: Self-pay | Admitting: Family Medicine

## 2023-07-07 NOTE — Telephone Encounter (Signed)
Unable to refill per protocol, rx request is too soon. Last refill 06/23/23 for 30 days.  Requested Prescriptions  Pending Prescriptions Disp Refills   venlafaxine XR (EFFEXOR-XR) 150 MG 24 hr capsule [Pharmacy Med Name: Venlafaxine HCl ER 150 MG Oral Capsule Extended Release 24 Hour] 30 capsule 11    Sig: TAKE 1 CAPSULE BY MOUTH DAILY  WITH BREAKFAST (NEED APPT WITH  PCP)     Psychiatry: Antidepressants - SNRI - desvenlafaxine & venlafaxine Failed - 07/04/2023 10:28 PM      Failed - Last BP in normal range    BP Readings from Last 1 Encounters:  12/06/22 (!) 142/86         Failed - Valid encounter within last 6 months    Recent Outpatient Visits           1 year ago Benign essential HTN   [facility] Tanya Nones, Priscille Heidelberg, MD   1 year ago Colon cancer screening   [facility] Donita Brooks, MD   3 years ago Crushing injury of right thumb, initial encounter   [facility] Jenne Pane, Crystal A, FNP   3 years ago Elevated blood pressure reading   [facility] Donita Brooks, MD   5 years ago Paroxysmal atrial fibrillation [facility])   [facility] Pickard, Priscille Heidelberg, MD              Failed - Lipid Panel in normal range within the last 12 months    Cholesterol  Date Value Ref Range Status  12/06/2022 154 <200 mg/dL Final   LDL Cholesterol (Calc)  Date Value Ref Range Status  12/06/2022 77 mg/dL (calc) Final    Comment:    Reference range: <100 . Desirable range <100 mg/dL for primary prevention;   <70 mg/dL for patients with CHD or diabetic patients  with > or = 2 CHD risk factors. Marland Kitchen LDL-C is now calculated using the Martin-Hopkins  calculation, which is a validated novel method providing  better accuracy than the Friedewald equation in the  estimation of LDL-C.  Horald Pollen et al. Lenox Ahr. 9629;528(41): 2061-2068  (http://education.[facility].com/faq/FAQ164)    HDL  Date Value Ref Range  Status  12/06/2022 53 > OR = 40 mg/dL Final   Triglycerides  Date Value Ref Range Status  12/06/2022 144 <150 mg/dL Final         Passed - Cr in normal range and within 360 days    Creat  Date Value Ref Range Status  12/06/2022 1.03 0.70 - 1.35 mg/dL Final         Passed - Completed PHQ-2 or PHQ-9 in the last 360 days

## 2023-07-14 ENCOUNTER — Ambulatory Visit: Payer: BC Managed Care – PPO | Admitting: Family Medicine

## 2023-07-14 ENCOUNTER — Encounter: Payer: Self-pay | Admitting: Family Medicine

## 2023-07-14 VITALS — BP 124/62 | HR 85 | Temp 98.8°F | Ht 74.0 in | Wt 252.0 lb

## 2023-07-14 DIAGNOSIS — E7800 Pure hypercholesterolemia, unspecified: Secondary | ICD-10-CM | POA: Diagnosis not present

## 2023-07-14 NOTE — Progress Notes (Signed)
Subjective:     Patient ID: Jason Fleming, male   DOB: June 20, 1961, 62 y.o.   MRN: [id number]  HPI Patient is a very pleasant 62 year old Caucasian gentleman here today for checkup.  He has a history of hypertension and hyperlipidemia.  He is due to recheck his cholesterol.  He denies any chest pain, shortness of breath, dyspnea on exertion.  He denies any myalgias or right upper quadrant pain. Past Medical History:  Diagnosis Date   Anxiety    Depression    Hypertension    Paroxysmal atrial fibrillation ([facility])    cardioversion in ed 05/2018   Past Surgical History:  Procedure Laterality Date   CARPAL TUNNEL RELEASE     COLONOSCOPY  2013   Dr.Hung-normal exam   MASS EXCISION  05/26/2012   Procedure: MINOR EXCISION OF MASS;  Surgeon: Wyn Forster., MD;  Location: [facility];  Service: Orthopedics;  Laterality: Left;   TONSILLECTOMY     WISDOM TOOTH EXTRACTION     Current Outpatient Medications on File Prior to Visit  Medication Sig Dispense Refill   amLODipine (NORVASC) 5 MG tablet TAKE 1 TABLET BY MOUTH DAILY 90 tablet 3   Multiple Vitamin (MULTIVITAMIN) tablet Take 1 tablet by mouth daily.     Omega-3 Fatty Acids (FISH OIL PO) Take by mouth.     rosuvastatin (CRESTOR) 10 MG tablet TAKE 1 TABLET BY MOUTH DAILY 30 tablet 11   venlafaxine XR (EFFEXOR-XR) 150 MG 24 hr capsule Take 1 capsule (150 mg total) by mouth daily with breakfast. 30 capsule 0   No current facility-administered medications on file prior to visit.   No Known Allergies Social History   Socioeconomic History   Marital status: Married    Spouse name: Not on file   Number of children: Not on file   Years of education: Not on file   Highest education level: Not on file  Occupational History   Not on file  Tobacco Use   Smoking status: Every Day    Types: E-cigarettes    Last attempt to quit: 03/02/2012    Years since quitting: 11.3   Smokeless tobacco: Never  Vaping Use   Vaping  status: Every Day  Substance and Sexual Activity   Alcohol use: Yes    Alcohol/week: 3.0 standard drinks of alcohol    Types: 3 Standard drinks or equivalent per week   Drug use: No   Sexual activity: Not on file  Other Topics Concern   Not on file  Social History Narrative   Not on file   Social Determinants of Health   Financial Resource Strain: Not on file  Food Insecurity: Not on file  Transportation Needs: Not on file  Physical Activity: Not on file  Stress: Not on file  Social Connections: Not on file  Intimate Partner Violence: Not on file   Family History  Problem Relation Age of Onset   Anxiety disorder Mother    Hypertension Mother    Depression Father    Heart disease Father    Alcohol abuse Maternal Uncle    Alcohol abuse Paternal Uncle    Stroke Maternal Grandfather    Colon cancer Neg Hx    Colon polyps Neg Hx    Esophageal cancer Neg Hx    Stomach cancer Neg Hx    Rectal cancer Neg Hx     Review of Systems  All other systems reviewed and are negative.      Objective:  Physical Exam Vitals reviewed.  Constitutional:      General: He is not in acute distress.    Appearance: Normal appearance. He is well-developed. He is not diaphoretic.  HENT:     Head: Normocephalic and atraumatic.  Eyes:     Conjunctiva/sclera: Conjunctivae normal.     Pupils: Pupils are equal, round, and reactive to light.  Neck:     Thyroid: No thyromegaly.     Vascular: No JVD.  Cardiovascular:     Rate and Rhythm: Normal rate and regular rhythm.     Heart sounds: No murmur heard.    No friction rub. No gallop.  Pulmonary:     Effort: Pulmonary effort is normal. No respiratory distress.     Breath sounds: Normal breath sounds. No stridor. No wheezing or rales.  Chest:     Chest wall: No tenderness.  Abdominal:     General: Bowel sounds are normal. There is no distension.     Palpations: Abdomen is soft. There is no mass.     Tenderness: There is no abdominal  tenderness. There is no guarding or rebound.     Hernia: No hernia is present.  Musculoskeletal:     Right lower leg: No edema.     Left lower leg: No edema.  Skin:    Findings: No rash.  Neurological:     Mental Status: He is alert and oriented to person, place, and time.     Cranial Nerves: No cranial nerve deficit.     Motor: No abnormal muscle tone.     Coordination: Coordination normal.     Deep Tendon Reflexes: Reflexes normal.  Psychiatric:        Mood and Affect: Mood normal.        Behavior: Behavior normal.        Assessment:     Pure hypercholesterolemia - Plan: CT CARDIAC SCORING (SELF PAY ONLY), COMPLETE METABOLIC PANEL WITH GFR, CBC with Differential/Platelet, Lipid panel    Plan:  Patient blood pressure is excellent.  We discussed risk factor hypertension and he like to get coronary artery calcium score to determine the necessity of taking a statin.  Also check CBC CMP and fasting lipid panel.  The patient has an elevated coronary artery calcium score I would like to get his LDL cholesterol below 55.

## 2023-07-15 LAB — CBC WITH DIFFERENTIAL/PLATELET
Absolute Monocytes: 589 {cells}/uL (ref 200–950)
Basophils Absolute: 109 {cells}/uL (ref 0–200)
Basophils Relative: 1.7 %
Eosinophils Absolute: 461 {cells}/uL (ref 15–500)
Eosinophils Relative: 7.2 %
HCT: 42.1 % (ref 38.5–50.0)
Hemoglobin: 13.7 g/dL (ref 13.2–17.1)
Lymphs Abs: 1907 {cells}/uL (ref 850–3900)
MCH: 28.4 pg (ref 27.0–33.0)
MCHC: 32.5 g/dL (ref 32.0–36.0)
MCV: 87.2 fL (ref 80.0–100.0)
MPV: 11.7 fL (ref 7.5–12.5)
Monocytes Relative: 9.2 %
Neutro Abs: 3334 {cells}/uL (ref 1500–7800)
Neutrophils Relative %: 52.1 %
Platelets: 204 10*3/uL (ref 140–400)
RBC: 4.83 10*6/uL (ref 4.20–5.80)
RDW: 13.4 % (ref 11.0–15.0)
Total Lymphocyte: 29.8 %
WBC: 6.4 10*3/uL (ref 3.8–10.8)

## 2023-07-15 LAB — LIPID PANEL
Cholesterol: 136 mg/dL (ref <200)
HDL: 52 mg/dL (ref >=40)
LDL Cholesterol (Calc): 57 mg/dL
Non-HDL Cholesterol (Calc): 84 mg/dL (calc) (ref <130)
Total CHOL/HDL Ratio: 2.6 (calc) (ref <5.0)
Triglycerides: 200 mg/dL — ABNORMAL HIGH (ref <150)

## 2023-07-15 LAB — COMPLETE METABOLIC PANEL WITH GFR
AG Ratio: 1.8 (calc) (ref 1.0–2.5)
ALT: 42 U/L (ref 9–46)
AST: 45 U/L — ABNORMAL HIGH (ref 10–35)
Albumin: 4.8 g/dL (ref 3.6–5.1)
Alkaline phosphatase (APISO): 55 U/L (ref 35–144)
BUN: 12 mg/dL (ref 7–25)
CO2: 29 mmol/L (ref 20–32)
Calcium: 9.5 mg/dL (ref 8.6–10.3)
Chloride: 101 mmol/L (ref 98–110)
Creat: 1.22 mg/dL (ref 0.70–1.35)
Globulin: 2.6 g/dL (ref 1.9–3.7)
Glucose, Bld: 82 mg/dL (ref 65–99)
Potassium: 3.8 mmol/L (ref 3.5–5.3)
Sodium: 139 mmol/L (ref 135–146)
Total Bilirubin: 0.7 mg/dL (ref 0.2–1.2)
Total Protein: 7.4 g/dL (ref 6.1–8.1)
eGFR: 67 mL/min/{1.73_m2} (ref >=60)

## 2023-07-18 ENCOUNTER — Ambulatory Visit
Admission: RE | Admit: 2023-07-18 | Discharge: 2023-07-18 | Disposition: A | Discharge status: Home / Self Care | Payer: BC Managed Care – PPO | Source: Ambulatory Visit | Attending: Family Medicine | Admitting: Family Medicine

## 2023-07-18 DIAGNOSIS — E7800 Pure hypercholesterolemia, unspecified: Secondary | ICD-10-CM | POA: Insufficient documentation

## 2023-07-29 ENCOUNTER — Other Ambulatory Visit: Payer: Self-pay | Admitting: Family Medicine

## 2023-07-29 ENCOUNTER — Other Ambulatory Visit: Payer: Self-pay

## 2023-07-29 DIAGNOSIS — R931 Abnormal findings on diagnostic imaging of heart and coronary circulation: Secondary | ICD-10-CM

## 2023-07-29 DIAGNOSIS — I480 Paroxysmal atrial fibrillation: Secondary | ICD-10-CM

## 2023-07-29 MED ORDER — ROSUVASTATIN CALCIUM 20 MG PO TABS: 20.0000 mg | ORAL_TABLET | Freq: Every day | ORAL | 3 refills | Status: DC

## 2023-07-29 MED ORDER — ASPIRIN 81 MG PO TBEC: 81.0000 mg | DELAYED_RELEASE_TABLET | Freq: Every day | ORAL | Status: DC

## 2023-08-12 NOTE — Progress Notes (Unsigned)
Cardiology Office Note:  .   Date:  08/13/2023 ID:  Jason Fleming, DOB [birth year], MRN [id number] PCP: Donita Brooks, MD [facility] Providers Cardiologist:  None   Patient Profile: .      PMH: Elevated coronary artery calcium score CT calcium score 07/26/23 with CAC of 479 (89th percentile) Dilated aortic root Aortic atherosclerosis Hypertension Dyslipidemia PAF DCCV 05/2018 Echocardiogram 06/12/2018 Normal LVEF55-60% Normal diastolic parameters Aortic root dilated at 40 mm No significant valve disease       History of Present Illness: Jason Fleming is a very pleasant 62 y.o. male  who is here today for new patient consult for elevated coronary calcium score. He reports he is feeling well. He had CT calcium score as a screening tool due to his history of hyperlipidemia. He is overall feeling well. Was seen by Rudi Coco, NP on 06/11/2018 after ER visit which revealed A-fib with RVR.  He underwent successful cardioversion and took Eliquis 5 mg for one month. CHA2DS2-VASc score at that time was 0. He had been drinking several refills of tea when the episode occurred. No further episodes of a fib to his awareness. He has significantly reduced his consumption of sweet tea. He has been on rosuvastatin for some time, but he was advised to increase the dose when he got the results of the calcium score. Was advised to start aspirin 81 mg daily which he has not yet started. 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 Does some yard work but not push mowing or heavy gardening. He denies chest pain, lower extremity edema, fatigue, palpitations,  presyncope, syncope, orthopnea, and PND. He does note shortness of breath if he is "really pushing himself."    Family history: His family history includes Alcohol abuse in his maternal uncle and paternal uncle; Anxiety disorder in his mother; CAD in his father; Depression in his father;  Heart disease in his father; Hypertension in his mother; Stroke in his maternal grandfather.   ASCVD Risk Score: The 10-year ASCVD risk score (Arnett DK, et al., 2019) is: 11.1%   Values used to calculate the score:     Age: 64 years     Sex: Male     Is Non-Hispanic African American: No     Diabetic: No     Tobacco smoker: Yes     Systolic Blood Pressure: 124 mmHg     Is BP treated: Yes     HDL Cholesterol: 52 mg/dL     Total Cholesterol: 136 mg/dL    Diet: Typically avoids fried foods Breakfast is bowl of cereal or oatmeal and bananas Typical lunch - [place] food 2 x per week, pimento cheese or bologna sandwich, sometimes just fruit Dinners vary  Overall, he does not practice dietary restrictions Occasional sweet tea Rare Etoh  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[employer], 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 No regular exercise Previously walking 2 1/2 miles  Sweet tea occasionally   ROS: See HPI        Studies Reviewed: Marland Kitchen   EKG Interpretation Date/Time:  Wednesday August 13 2023 10:42:42 EDT Ventricular Rate:  77 PR Interval:  166 QRS Duration:  92 QT Interval:  372 QTC Calculation: 420 R Axis:   5  Text Interpretation: Normal sinus rhythm Normal ECG When compared with ECG of 11-Jun-2018 08:49, No significant change was found No ST abnormality Confirmed by [employer],  Marcelino Duster [phone]) on 08/13/2023 11:26:08 AM       Risk Assessment/Calculations:            Physical Exam:   VS: BP 124/88   Pulse 77   Ht 6\' 2"  (1.88 m)   Wt 259 lb (117.5 kg)   SpO2 95%   BMI 33.25 kg/m   Wt Readings from Last 3 Encounters:  08/13/23 259 lb (117.5 kg)  07/14/23 252 lb (114.3 kg)  12/06/22 256 lb (116.1 kg)     GEN: Well nourished, well developed in no acute distress NECK: No JVD; No carotid bruits CARDIAC: RRR, no murmurs, rubs, gallops RESPIRATORY:  Clear to auscultation without rales, wheezing or rhonchi  ABDOMEN: Soft, non-tender,  non-distended EXTREMITIES:  No edema; No deformity     ASSESSMENT AND PLAN: .    CAD without angina: CT calcium score revealed coronary calcium score of 479 (89th percentile) with the bulk of plaque in LAD alone. He was recommended to start aspirin which he has not done but agrees to start. He denies chest pain, dyspnea, or other symptoms concerning for angina, however he does not exercise on a consistent basis.  Due to the concern for CAC > 300 in LAD, we will get coronary CTA for mapping of coronary anatomy.  Will have him take metoprolol 100 mg 2 hours prior to CT.  Aim for LDL < 70, ideally < 55.   Dilated aortic root: Aortic root dimension 40 mm on echo 05/2018. CT 06/2023 states normal caliber of exposed portion of aorta. We will screen for aortic dilatation on coronary CTA.   Mixed dyslipidemia: LDL-C 57, TGs 200 on 07/14/23. Rosuvastatin was increased recently. We will plan for repeat lipid testing at next office visit along with Apo B and Lpa testing. Encouraged heart healthy diet avoiding saturated fat, processed foods, simple carbohydrates, and sugar. Continue rosuvastatin.   Hypertension: BP is well controlled. Continue amlodipine.   CV Risk Assessment: ASCVD risk score 11.1%.  We discussed the risk factors in detail. Unfortunately, he continues to use e-cigarettes. Complete cessation advised.  BP is well-controlled.  Encouraged dietary changes to reduce triglycerides as well as increasing exercise.  PAF: Isolated episode of atrial fibrillation in 2019 with no further occurrence to his awareness. Continue to monitor for return of symptoms.   Plan/Goals: Reduce intake of simple carbohydrates, sugar, saturated fat and processed foods  Activity:  Start walking at brisk pace for at least 30 minutes daily       Dispo: 2-3 months with me  Signed, Eligha Bridegroom, NP-C

## 2023-08-13 ENCOUNTER — Encounter (HOSPITAL_BASED_OUTPATIENT_CLINIC_OR_DEPARTMENT_OTHER): Payer: Self-pay | Admitting: Nurse Practitioner

## 2023-08-13 ENCOUNTER — Ambulatory Visit (HOSPITAL_BASED_OUTPATIENT_CLINIC_OR_DEPARTMENT_OTHER): Payer: BC Managed Care – PPO | Admitting: Nurse Practitioner

## 2023-08-13 VITALS — BP 124/88 | HR 77 | Ht 74.0 in | Wt 259.0 lb

## 2023-08-13 DIAGNOSIS — I2510 Atherosclerotic heart disease of native coronary artery without angina pectoris: Secondary | ICD-10-CM

## 2023-08-13 DIAGNOSIS — Z7189 Other specified counseling: Secondary | ICD-10-CM

## 2023-08-13 DIAGNOSIS — I77810 Thoracic aortic ectasia: Secondary | ICD-10-CM

## 2023-08-13 DIAGNOSIS — E785 Hyperlipidemia, unspecified: Secondary | ICD-10-CM

## 2023-08-13 DIAGNOSIS — R931 Abnormal findings on diagnostic imaging of heart and coronary circulation: Secondary | ICD-10-CM

## 2023-08-13 DIAGNOSIS — I10 Essential (primary) hypertension: Secondary | ICD-10-CM

## 2023-08-13 DIAGNOSIS — I480 Paroxysmal atrial fibrillation: Secondary | ICD-10-CM

## 2023-08-13 MED ORDER — METOPROLOL TARTRATE 100 MG PO TABS: ORAL_TABLET | ORAL | 0 refills | Status: DC

## 2023-08-13 NOTE — Patient Instructions (Signed)
Medication Instructions:   Your physician recommends that you continue on your current medications as directed. Please refer to the Current Medication list given to you today.   *If you need a refill on your cardiac medications before your next appointment, please call your pharmacy*   Lab Work:  TODAY!!!! BMET  If you have labs (blood work) drawn today and your tests are completely normal, you will receive your results only by: MyChart Message (if you have MyChart) OR A paper copy in the mail If you have any lab test that is abnormal or we need to change your treatment, we will call you to review the results.   Testing/Procedures:    Your cardiac CT will be scheduled at one of the below locations:   [facility] [address] [place], [place] [address] [phone]   If scheduled at [facility], please arrive at the [facility] and Children's Entrance (Entrance C2) of [facility] 30 minutes prior to test start time. You can use the FREE valet parking offered at entrance C (encouraged to control the heart rate for the test)  Proceed to the [facility] Radiology Department (first floor) to check-in and test prep.  All radiology patients and guests should use entrance C2 at [facility], accessed from [facility], even though the hospital's physical address listed is [address]     There is spacious parking and easy access to the radiology department from the [facility] entrance. Please enter here and check-in with the desk attendant.   Please follow these instructions carefully (unless otherwise directed):  An IV will be required for this test and Nitroglycerin will be given.  Hold all erectile dysfunction medications at least 3 days (72 hrs) prior to test. (Ie viagra, cialis, sildenafil, tadalafil, etc)   On the Night Before the Test: Be sure to Drink plenty of water. Do not consume any  caffeinated/decaffeinated beverages or chocolate 12 hours prior to your test. Do not take any antihistamines 12 hours prior to your test.  On the Day of the Test: Drink plenty of water until 1 hour prior to the test. Do not eat any food 1 hour prior to test. You may take your regular medications prior to the test.  Take metoprolol (Lopressor) ( 100 MG) two hours prior to test.       After the Test: Drink plenty of water. After receiving IV contrast, you may experience a mild flushed feeling. This is normal. On occasion, you may experience a mild rash up to 24 hours after the test. This is not dangerous. If this occurs, you can take Benadryl 25 mg and increase your fluid intake. If you experience trouble breathing, this can be serious. If it is severe call 911 IMMEDIATELY. If it is mild, please call our office.  We will call to schedule your test 2-4 weeks out understanding that some insurance companies will need an authorization prior to the service being performed.   For more information and frequently asked questions, please visit our website : [email or web address]  For non-scheduling related questions, please contact the cardiac imaging nurse navigator should you have any questions/concerns: Cardiac Imaging Nurse Navigators Direct Office Dial: [phone]   For scheduling needs, including cancellations and rescheduling, please call [place], [phone].    Follow-Up: At [facility], you and your health needs are our priority.  As part of our continuing mission to provide you with exceptional heart care, we  have created designated Provider Care Teams.  These Care Teams include your primary Cardiologist (physician) and Advanced Practice Providers (APPs -  Physician Assistants and Nurse Practitioners) who all work together to provide you with the care you need, when you need it.  We recommend signing up for the patient portal called "MyChart".  Sign up  information is provided on this After Visit Summary.  MyChart is used to connect with patients for Virtual Visits (Telemedicine).  Patients are able to view lab/test results, encounter notes, upcoming appointments, etc.  Non-urgent messages can be sent to your provider as well.   To learn more about what you can do with MyChart, go to [email or web address].    Your next appointment:   2 month(s)  Provider:   Eligha Bridegroom, NP    Other Instructions      Mediterranean Diet  Why follow it? Research shows. Those who follow the Mediterranean diet have a reduced risk of heart disease  The diet is associated with a reduced incidence of Parkinson's and Alzheimer's diseases People following the diet may have longer life expectancies and lower rates of chronic diseases  The Dietary Guidelines for Americans recommends the Mediterranean diet as an eating plan to promote health and prevent disease  What Is the Mediterranean Diet?  Healthy eating plan based on typical foods and recipes of Mediterranean-style cooking The diet is primarily a plant based diet; these foods should make up a majority of meals   Starches - Plant based foods should make up a majority of meals - They are an important sources of vitamins, minerals, energy, antioxidants, and fiber - Choose whole grains, foods high in fiber and minimally processed items  - Typical grain sources include wheat, oats, barley, corn, brown rice, bulgar, farro, millet, polenta, couscous  - Various types of beans include chickpeas, lentils, fava beans, black beans, white beans   Fruits  Veggies - Large quantities of antioxidant rich fruits & veggies; 6 or more servings  - Vegetables can be eaten raw or lightly drizzled with oil and cooked  - Vegetables common to the traditional Mediterranean Diet include: artichokes, arugula, beets, broccoli, brussel sprouts, cabbage, carrots, celery, collard greens, cucumbers, eggplant, kale, leeks, lemons,  lettuce, mushrooms, okra, onions, peas, peppers, potatoes, pumpkin, radishes, rutabaga, shallots, spinach, sweet potatoes, turnips, zucchini - Fruits common to the Mediterranean Diet include: apples, apricots, avocados, cherries, clementines, dates, figs, grapefruits, grapes, melons, nectarines, oranges, peaches, pears, pomegranates, strawberries, tangerines  Fats - Replace butter and margarine with healthy oils, such as olive oil, canola oil, and tahini  - Limit nuts to no more than a handful a day  - Nuts include walnuts, almonds, pecans, pistachios, pine nuts  - Limit or avoid candied, honey roasted or heavily salted nuts - Olives are central to the [employer] - can be eaten whole or used in a variety of dishes   Meats Protein - Limiting red meat: no more than a few times a month - When eating red meat: choose lean cuts and keep the portion to the size of deck of cards - Eggs: approx. 0 to 4 times a week  - Fish and lean poultry: at least 2 a week  - Healthy protein sources include, chicken, [place], lean beef, lamb - Increase intake of seafood such as tuna, salmon, trout, mackerel, shrimp, scallops - Avoid or limit high fat processed meats such as sausage and bacon  Dairy - Include moderate amounts of low fat dairy products  - Focus on  healthy dairy such as fat free yogurt, skim milk, low or reduced fat cheese - Limit dairy products higher in fat such as whole or 2% milk, cheese, ice cream  Alcohol - Moderate amounts of red wine is ok  - No more than 5 oz daily for women (all ages) and men older than age 52  - No more than 10 oz of wine daily for men younger than 62  Other - Limit sweets and other desserts  - Use herbs and spices instead of salt to flavor foods  - Herbs and spices common to the traditional Mediterranean Diet include: basil, bay leaves, chives, cloves, cumin, fennel, garlic, lavender, marjoram, mint, oregano, parsley, pepper, rosemary, sage, savory, sumac, tarragon,  thyme   It's not just a diet, it's a lifestyle:  The Mediterranean diet includes lifestyle factors typical of those in the region  Foods, drinks and meals are best eaten with others and savored Daily physical activity is important for overall good health This could be strenuous exercise like running and aerobics This could also be more leisurely activities such as walking, housework, yard-work, or taking the stairs Moderation is the key; a balanced and healthy diet accommodates most foods and drinks Consider portion sizes and frequency of consumption of certain foods   Meal Ideas & Options:  Breakfast:  Whole wheat toast or whole wheat English muffins with peanut butter & hard boiled egg Steel cut oats topped with apples & cinnamon and skim milk  Fresh fruit: banana, strawberries, melon, berries, peaches  Smoothies: strawberries, bananas, greek yogurt, peanut butter Low fat greek yogurt with blueberries and granola  Egg white omelet with spinach and mushrooms Breakfast couscous: whole wheat couscous, apricots, skim milk, cranberries  Sandwiches:  Hummus and grilled vegetables (peppers, zucchini, squash) on whole wheat bread   Grilled chicken on whole wheat pita with lettuce, tomatoes, cucumbers or tzatziki  [place] salad on whole wheat bread: tuna salad made with greek yogurt, olives, red peppers, capers, green onions Garlic rosemary lamb pita: lamb sauted with garlic, rosemary, salt & pepper; add lettuce, cucumber, greek yogurt to pita - flavor with lemon juice and black pepper  Seafood:  Mediterranean grilled salmon, seasoned with garlic, basil, parsley, lemon juice and black pepper Shrimp, lemon, and spinach whole-grain pasta salad made with low fat greek yogurt  Seared scallops with lemon orzo  Seared tuna steaks seasoned salt, pepper, coriander topped with tomato mixture of olives, tomatoes, olive oil, minced garlic, parsley, green onions and cappers  Meats:  Herbed greek chicken  salad with kalamata olives, cucumber, feta  Red bell peppers stuffed with spinach, bulgur, lean ground beef (or lentils) & topped with feta   Kebabs: skewers of chicken, tomatoes, onions, zucchini, squash  [place] burgers: made with red onions, mint, dill, lemon juice, feta cheese topped with roasted red peppers Vegetarian Cucumber salad: cucumbers, artichoke hearts, celery, red onion, feta cheese, tossed in olive oil & lemon juice  Hummus and whole grain pita points with a greek salad (lettuce, tomato, feta, olives, cucumbers, red onion) Lentil soup with celery, carrots made with vegetable broth, garlic, salt and pepper  Tabouli salad: parsley, bulgur, mint, scallions, cucumbers, tomato, radishes, lemon juice, olive oil, salt and pepper.

## 2023-08-14 LAB — BASIC METABOLIC PANEL
BUN/Creatinine Ratio: 8 — ABNORMAL LOW (ref 10–24)
BUN: 8 mg/dL (ref 8–27)
CO2: 24 mmol/L (ref 20–29)
Calcium: 10.2 mg/dL (ref 8.6–10.2)
Chloride: 98 mmol/L (ref 96–106)
Creatinine, Ser: 1.03 mg/dL (ref 0.76–1.27)
Glucose: 171 mg/dL — ABNORMAL HIGH (ref 70–99)
Potassium: 4 mmol/L (ref 3.5–5.2)
Sodium: 139 mmol/L (ref 134–144)
eGFR: 83 mL/min/{1.73_m2} (ref >59)

## 2023-08-15 NOTE — Progress Notes (Signed)
Pt has been made aware of normal result and verbalized understanding.  jw

## 2023-08-25 ENCOUNTER — Other Ambulatory Visit (HOSPITAL_COMMUNITY): Payer: Self-pay | Admitting: *Deleted

## 2023-08-25 ENCOUNTER — Encounter (HOSPITAL_COMMUNITY): Payer: Self-pay

## 2023-08-25 DIAGNOSIS — I77810 Thoracic aortic ectasia: Secondary | ICD-10-CM

## 2023-08-27 ENCOUNTER — Ambulatory Visit (HOSPITAL_COMMUNITY): Admission: RE | Admit: 2023-08-27 | Payer: BC Managed Care – PPO | Source: Ambulatory Visit

## 2023-10-13 NOTE — Progress Notes (Deleted)
Cardiology Office Note:  .   Date:  10/13/2023 ID:  Jason Fleming, DOB 08/10/1961, MRN [id number] PCP: Donita Brooks, MD [facility] Providers Cardiologist:  None   Patient Profile: .      PMH: Elevated coronary artery calcium score CT calcium score 07/26/23 with CAC of 479 (89th percentile) Dilated aortic root Aortic atherosclerosis Hypertension Dyslipidemia PAF DCCV 05/2018 Echocardiogram 06/12/2018 Normal LVEF55-60% Normal diastolic parameters Aortic root dilated at 40 mm No significant valve disease  Referred to cardiology as new patient consult for elevated coronary calcium score. Was feeling well. Had CT calcium score as a screening tool due to history of hyperlipidemia. Was seen by Rudi Coco, NP on 06/11/2018 after ER visit which revealed A-fib with RVR.  He underwent successful cardioversion and took Eliquis 5 mg for one month. CHA2DS2-VASc score at that time was 0. He had been drinking several refills of tea when the episode occurred. No further episodes of a fib to his awareness. Has significantly reduced his consumption of sweet tea. Has been on rosuvastatin for some time, but he was advised to increase the dose when he got the results of the calcium score. Was advised to start aspirin 81 mg daily which he has not yet started. 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 Does some yard work but not push mowing or heavy gardening. He denied chest pain, lower extremity edema, fatigue, palpitations,  presyncope, syncope, orthopnea, and PND. He does note shortness of breath if he is "really pushing himself."  ASCVD risk score was calculated at 11.1%. He admitted to dietary indiscretion including eating out for lunch frequently at [employer], or eating bologna or pimento cheese. Rare Etoh, occasional sweet tea.        History of Present Illness: Jason Fleming is a very pleasant 62 y.o. male  who is here today  for       ROS: See HPI        Studies Reviewed: .           Risk Assessment/Calculations:            Physical Exam:   VS: There were no vitals taken for this visit.  Wt Readings from Last 3 Encounters:  08/13/23 259 lb (117.5 kg)  07/14/23 252 lb (114.3 kg)  12/06/22 256 lb (116.1 kg)     GEN: Well nourished, well developed in no acute distress NECK: No JVD; No carotid bruits CARDIAC: RRR, no murmurs, rubs, gallops RESPIRATORY:  Clear to auscultation without rales, wheezing or rhonchi  ABDOMEN: Soft, non-tender, non-distended EXTREMITIES:  No edema; No deformity     ASSESSMENT AND PLAN: .    CAD without angina: CT calcium score revealed coronary calcium score of 479 (89th percentile) with the bulk of plaque in LAD alone. He was recommended to start aspirin which he has not done but agrees to start. He denies chest pain, dyspnea, or other symptoms concerning for angina, however he does not exercise on a consistent basis.  Due to the concern for CAC > 300 in LAD, we will get coronary CTA for mapping of coronary anatomy.  Will have him take metoprolol 100 mg 2 hours prior to CT.  Aim for LDL < 70, ideally < 55.   Dilated aortic root: Aortic root dimension 40 mm on echo 05/2018. CT 06/2023 states normal caliber of exposed portion of aorta. We will screen for aortic  dilatation on coronary CTA.   Mixed dyslipidemia: LDL-C 57, TGs 200 on 07/14/23. Rosuvastatin was increased recently. We will plan for repeat lipid testing at next office visit along with Apo B and Lpa testing. Encouraged heart healthy diet avoiding saturated fat, processed foods, simple carbohydrates, and sugar. Continue rosuvastatin.   Hypertension: BP is well controlled. Continue amlodipine.   PAF: Isolated episode of atrial fibrillation in 2019 with no further occurrence to his awareness. Continue to monitor for return of symptoms.   Plan/Goals: Reduce intake of simple carbohydrates, sugar, saturated fat and  processed foods  Activity:  Start walking at brisk pace for at least 30 minutes daily       Dispo: ***  Signed, Eligha Bridegroom, NP-C

## 2023-10-15 ENCOUNTER — Encounter (HOSPITAL_BASED_OUTPATIENT_CLINIC_OR_DEPARTMENT_OTHER): Payer: BC Managed Care – PPO | Admitting: Nurse Practitioner

## 2023-12-15 NOTE — Progress Notes (Unsigned)
Cardiology Office Note:  .   Date:  12/17/2023 ID:  Jason Fleming, DOB [birth year], MRN [id number] PCP: Donita Brooks, MD [facility] Providers Cardiologist:  None   Patient Profile: .      PMH: Elevated coronary artery calcium score CT calcium score 07/26/23 with CAC of 479 (89th percentile) Dilated aortic root Aortic atherosclerosis Hypertension Dyslipidemia PAF DCCV 05/2018 Echocardiogram 06/12/2018 Normal [id number]% Normal diastolic parameters Aortic root dilated at 40 mm No significant valve disease  Was seen by Rudi Coco, NP in A Fib clinic on 06/11/2018 after ER visit which revealed A-fib with RVR.  He underwent successful cardioversion and took Eliquis 5 mg for one month. CHA2DS2-VASc score at that time was 0. He had been drinking several refills of tea when the episode occurred.  Referred to cardiology and seen by me on 08/13/23 for new patient consult for elevated coronary calcium score. Reported feeling well. Had CT calcium score as a screening tool due to his history of hyperlipidemia.  No further episodes of a fib to his awareness. He has significantly reduced his consumption of sweet tea. He has been on rosuvastatin for some time, but he was advised to increase the dose when he got the results of the calcium score. Was advised to start aspirin 81 mg daily which he has not yet started. 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 Does some yard work but not push mowing or heavy gardening. He denies chest pain, lower extremity edema, fatigue, palpitations,  presyncope, syncope, orthopnea, and PND. He does note shortness of breath if he is "really pushing himself."  ASCVD risk score calculated at 11.1%.  History of CAD in his father, hypertension in his mother, and stroke in his maternal grandfather. His father had CABG at age 23;  he is still living at age 27. He typically avoids fried food, but admits to eating out several  times per week and also eating pimento cheese or bologna sandwiches on occasion. Rare EtOH, occasional sweet tea. Due to the concern for calcium score of 479 in LAD and no prior ischemia evaluation, we elected to get coronary CTA for ischemia evaluation.  We plan to check repeat lipid panel after 2-3 months of therapy on higher dose of rosuvastatin.        History of Present Illness: Jason Fleming is a very pleasant 63 y.o. male  who is here today for follow-up of CAD. He reports insurance denied his coronary CT. He reports he does get short of breath on occasion. Says this is occurring seldomly when he feels like he "gives out of breath quick" most commonly when it is very hot or very cold outside. He also feels these more when he is anxious. Home BP monitor is not working but he checked at [facility] one time recently and reading was 130s over 80s.  He had just walked in the store and had not been sitting down long. Reports difficulty falling asleep since he was young. Says his wife has never reported sleep apnea but she sleeps very soundly. He thinks that he snores.  He continues to deny chest pain, palpitations, orthopnea, PND, edema, presyncope, syncope.  We had a lengthy discussion about screening for obstructive coronary disease.  He admits he has not made any lifestyle modifications that would likely benefit his cardiovascular health.    ROS: See HPI        Studies Reviewed: .  Risk Assessment/Calculations:            Physical Exam:   VS: BP 112/68   Pulse (!) 50   Ht 6\' 2"  (1.88 m)   Wt 260 lb (117.9 kg)   BMI 33.38 kg/m   Wt Readings from Last 3 Encounters:  12/17/23 260 lb (117.9 kg)  08/13/23 259 lb (117.5 kg)  07/14/23 252 lb (114.3 kg)     GEN: Obese, well developed in no acute distress NECK: No JVD; No carotid bruits CARDIAC: IrregularRR, no murmurs, rubs, gallops RESPIRATORY:  Clear to auscultation without rales, wheezing or rhonchi  ABDOMEN: Soft,  non-tender, non-distended EXTREMITIES:  No edema; No deformity     ASSESSMENT AND PLAN: .    CAD without angina: CT calcium score revealed coronary calcium score of 479 (89th percentile) with the bulk of plaque in LAD alone. He denies chest pain, dyspnea, or other symptoms concerning for angina, however he does not exercise on a consistent basis. We planned to get coronary CTA due to concern for CAC > 300 in LAD and family history of CABG in his father in his 62s.  Unfortunately, insurance denied the CT.  We do lengthy discussion about further testing and I recommended an exercise Myoview for evaluation of ischemia.  He would like to think about this before scheduling. Encouraged lifestyle modification with focus on secondary prevention including heart healthy mostly plant based diet avoiding saturated fat, processed foods, simple carbohydrates, and sugar along with aiming for at least 150 minutes of moderate intensity exercise each week.   Dilated aortic root: Aortic root dimension 40 mm on echo 05/2018. CT 06/2023 states normal caliber of exposed portion of aorta. We discussed these past findings and that there is no indication to repeat imaging at this time.   Mixed dyslipidemia: LDL-C 57, TGs 200 on 07/14/23. Rosuvastatin was increased a few months ago.  We will have him return in a few days when he can return fasting for NMR lipid panel, lipoprotein a, and ALT. Encouraged heart healthy diet avoiding saturated fat, processed foods, simple carbohydrates, and sugar. Continue rosuvastatin.   Hypertension: BP is well controlled and remains < 120/80 on my recheck. Continue amlodipine.   PAF: Isolated episode of atrial fibrillation in 2019 with no further occurrence to his awareness. We discussed potential risk factors for return of A-fib including obesity, sleep apnea, heavy alcohol consumption. Continue to monitor for return of symptoms.        Dispo: 6 months with Dr. Duke Salvia or  me  Signed, Eligha Bridegroom, NP-C

## 2023-12-17 ENCOUNTER — Other Ambulatory Visit (HOSPITAL_BASED_OUTPATIENT_CLINIC_OR_DEPARTMENT_OTHER): Payer: Self-pay | Admitting: *Deleted

## 2023-12-17 ENCOUNTER — Encounter (HOSPITAL_BASED_OUTPATIENT_CLINIC_OR_DEPARTMENT_OTHER): Payer: Self-pay | Admitting: Nurse Practitioner

## 2023-12-17 ENCOUNTER — Ambulatory Visit (HOSPITAL_BASED_OUTPATIENT_CLINIC_OR_DEPARTMENT_OTHER): Payer: BC Managed Care – PPO | Admitting: Nurse Practitioner

## 2023-12-17 VITALS — BP 112/68 | HR 50 | Ht 74.0 in | Wt 260.0 lb

## 2023-12-17 DIAGNOSIS — E785 Hyperlipidemia, unspecified: Secondary | ICD-10-CM

## 2023-12-17 DIAGNOSIS — I2510 Atherosclerotic heart disease of native coronary artery without angina pectoris: Secondary | ICD-10-CM

## 2023-12-17 DIAGNOSIS — I10 Essential (primary) hypertension: Secondary | ICD-10-CM

## 2023-12-17 DIAGNOSIS — I480 Paroxysmal atrial fibrillation: Secondary | ICD-10-CM

## 2023-12-17 DIAGNOSIS — I77810 Thoracic aortic ectasia: Secondary | ICD-10-CM

## 2023-12-17 DIAGNOSIS — R931 Abnormal findings on diagnostic imaging of heart and coronary circulation: Secondary | ICD-10-CM

## 2023-12-17 NOTE — Patient Instructions (Signed)
Medication Instructions:   Your physician recommends that you continue on your current medications as directed. Please refer to the Current Medication list given to you today.   *If you need a refill on your cardiac medications before your next appointment, please call your pharmacy*   Lab Work:  Please get fasting labs either this week or next week.  Paperwork given to pt today.   If you have labs (blood work) drawn today and your tests are completely normal, you will receive your results only by: MyChart Message (if you have MyChart) OR A paper copy in the mail If you have any lab test that is abnormal or we need to change your treatment, we will call you to review the results.   Testing/Procedures:  None ordered.    Follow-Up: At [facility], you and your health needs are our priority.  As part of our continuing mission to provide you with exceptional heart care, we have created designated Provider Care Teams.  These Care Teams include your primary Cardiologist (physician) and Advanced Practice Providers (APPs -  Physician Assistants and Nurse Practitioners) who all work together to provide you with the care you need, when you need it.  We recommend signing up for the patient portal called "MyChart".  Sign up information is provided on this After Visit Summary.  MyChart is used to connect with patients for Virtual Visits (Telemedicine).  Patients are able to view lab/test results, encounter notes, upcoming appointments, etc.  Non-urgent messages can be sent to your provider as well.   To learn more about what you can do with MyChart, go to [email or web address].    Your next appointment:   6 month(s)  Provider:   Eligha Bridegroom, NP    Other Instructions  I will send you a mychart message in May with appt time and date for am appointment with Marcelino Duster.

## 2024-04-26 ENCOUNTER — Inpatient Hospital Stay (HOSPITAL_COMMUNITY)
Admission: EM | Admit: 2024-04-26 | Discharge: 2024-05-03 | DRG: 286 | Disposition: A | Discharge status: Home / Self Care | Attending: Cardiology | Admitting: Cardiology

## 2024-04-26 ENCOUNTER — Other Ambulatory Visit (HOSPITAL_COMMUNITY)

## 2024-04-26 ENCOUNTER — Other Ambulatory Visit: Payer: Self-pay | Admitting: Family Medicine

## 2024-04-26 ENCOUNTER — Other Ambulatory Visit: Payer: Self-pay

## 2024-04-26 ENCOUNTER — Emergency Department (HOSPITAL_COMMUNITY)

## 2024-04-26 ENCOUNTER — Ambulatory Visit: Admitting: Family Medicine

## 2024-04-26 ENCOUNTER — Encounter: Payer: Self-pay | Admitting: Family Medicine

## 2024-04-26 ENCOUNTER — Encounter (HOSPITAL_COMMUNITY): Payer: Self-pay | Admitting: Emergency Medicine

## 2024-04-26 VITALS — HR 125 | Temp 97.7°F | Ht 74.0 in | Wt 251.6 lb

## 2024-04-26 DIAGNOSIS — Z79899 Other long term (current) drug therapy: Secondary | ICD-10-CM | POA: Diagnosis not present

## 2024-04-26 DIAGNOSIS — I480 Paroxysmal atrial fibrillation: Secondary | ICD-10-CM

## 2024-04-26 DIAGNOSIS — Z7901 Long term (current) use of anticoagulants: Secondary | ICD-10-CM

## 2024-04-26 DIAGNOSIS — I5021 Acute systolic (congestive) heart failure: Secondary | ICD-10-CM | POA: Diagnosis not present

## 2024-04-26 DIAGNOSIS — I110 Hypertensive heart disease with heart failure: Secondary | ICD-10-CM | POA: Diagnosis not present

## 2024-04-26 DIAGNOSIS — Z743 Need for continuous supervision: Secondary | ICD-10-CM | POA: Diagnosis not present

## 2024-04-26 DIAGNOSIS — Z823 Family history of stroke: Secondary | ICD-10-CM | POA: Diagnosis not present

## 2024-04-26 DIAGNOSIS — N179 Acute kidney failure, unspecified: Secondary | ICD-10-CM | POA: Diagnosis not present

## 2024-04-26 DIAGNOSIS — R0602 Shortness of breath: Secondary | ICD-10-CM | POA: Diagnosis not present

## 2024-04-26 DIAGNOSIS — E669 Obesity, unspecified: Secondary | ICD-10-CM | POA: Diagnosis present

## 2024-04-26 DIAGNOSIS — I428 Other cardiomyopathies: Secondary | ICD-10-CM | POA: Diagnosis present

## 2024-04-26 DIAGNOSIS — Z6833 Body mass index (BMI) 33.0-33.9, adult: Secondary | ICD-10-CM

## 2024-04-26 DIAGNOSIS — Z8249 Family history of ischemic heart disease and other diseases of the circulatory system: Secondary | ICD-10-CM

## 2024-04-26 DIAGNOSIS — R570 Cardiogenic shock: Secondary | ICD-10-CM | POA: Diagnosis not present

## 2024-04-26 DIAGNOSIS — I10 Essential (primary) hypertension: Secondary | ICD-10-CM | POA: Insufficient documentation

## 2024-04-26 DIAGNOSIS — Z87891 Personal history of nicotine dependence: Secondary | ICD-10-CM | POA: Diagnosis not present

## 2024-04-26 DIAGNOSIS — I340 Nonrheumatic mitral (valve) insufficiency: Secondary | ICD-10-CM | POA: Diagnosis not present

## 2024-04-26 DIAGNOSIS — I5023 Acute on chronic systolic (congestive) heart failure: Secondary | ICD-10-CM | POA: Diagnosis present

## 2024-04-26 DIAGNOSIS — I272 Pulmonary hypertension, unspecified: Secondary | ICD-10-CM | POA: Diagnosis present

## 2024-04-26 DIAGNOSIS — I509 Heart failure, unspecified: Secondary | ICD-10-CM | POA: Diagnosis not present

## 2024-04-26 DIAGNOSIS — E876 Hypokalemia: Secondary | ICD-10-CM | POA: Diagnosis not present

## 2024-04-26 DIAGNOSIS — R579 Shock, unspecified: Secondary | ICD-10-CM | POA: Diagnosis not present

## 2024-04-26 DIAGNOSIS — Z452 Encounter for adjustment and management of vascular access device: Secondary | ICD-10-CM | POA: Diagnosis not present

## 2024-04-26 DIAGNOSIS — Z0189 Encounter for other specified special examinations: Secondary | ICD-10-CM | POA: Diagnosis not present

## 2024-04-26 DIAGNOSIS — R931 Abnormal findings on diagnostic imaging of heart and coronary circulation: Secondary | ICD-10-CM

## 2024-04-26 DIAGNOSIS — I4891 Unspecified atrial fibrillation: Principal | ICD-10-CM | POA: Diagnosis present

## 2024-04-26 DIAGNOSIS — Z7982 Long term (current) use of aspirin: Secondary | ICD-10-CM | POA: Diagnosis not present

## 2024-04-26 DIAGNOSIS — I517 Cardiomegaly: Secondary | ICD-10-CM | POA: Diagnosis not present

## 2024-04-26 DIAGNOSIS — K7200 Acute and subacute hepatic failure without coma: Secondary | ICD-10-CM | POA: Diagnosis present

## 2024-04-26 DIAGNOSIS — Z818 Family history of other mental and behavioral disorders: Secondary | ICD-10-CM | POA: Diagnosis not present

## 2024-04-26 DIAGNOSIS — R000 Tachycardia, unspecified: Secondary | ICD-10-CM | POA: Diagnosis not present

## 2024-04-26 DIAGNOSIS — I2510 Atherosclerotic heart disease of native coronary artery without angina pectoris: Secondary | ICD-10-CM | POA: Diagnosis present

## 2024-04-26 DIAGNOSIS — E782 Mixed hyperlipidemia: Secondary | ICD-10-CM | POA: Diagnosis not present

## 2024-04-26 HISTORY — DX: Unspecified atrial fibrillation: I48.91

## 2024-04-26 LAB — URINALYSIS, ROUTINE W REFLEX MICROSCOPIC
Bacteria, UA: NONE SEEN
Bilirubin Urine: NEGATIVE
Glucose, UA: NEGATIVE mg/dL
Hgb urine dipstick: NEGATIVE
Ketones, ur: NEGATIVE mg/dL
Leukocytes,Ua: NEGATIVE
Nitrite: NEGATIVE
Protein, ur: 100 mg/dL — AB
Specific Gravity, Urine: 1.016 (ref 1.005–1.030)
pH: 6 (ref 5.0–8.0)

## 2024-04-26 LAB — RAPID URINE DRUG SCREEN, HOSP PERFORMED
Amphetamines: NOT DETECTED
Barbiturates: NOT DETECTED
Benzodiazepines: NOT DETECTED
Cocaine: NOT DETECTED
Opiates: NOT DETECTED
Tetrahydrocannabinol: NOT DETECTED

## 2024-04-26 LAB — BASIC METABOLIC PANEL WITH GFR
Anion gap: 11 (ref 5–15)
BUN: 11 mg/dL (ref 8–23)
CO2: 24 mmol/L (ref 22–32)
Calcium: 8.7 mg/dL — ABNORMAL LOW (ref 8.9–10.3)
Chloride: 104 mmol/L (ref 98–111)
Creatinine, Ser: 0.94 mg/dL (ref 0.61–1.24)
GFR, Estimated: >60 mL/min (ref >60)
Glucose, Bld: 90 mg/dL (ref 70–99)
Potassium: 3.9 mmol/L (ref 3.5–5.1)
Sodium: 139 mmol/L (ref 135–145)

## 2024-04-26 LAB — CBC
HCT: 40.4 % (ref 39.0–52.0)
Hemoglobin: 12.5 g/dL — ABNORMAL LOW (ref 13.0–17.0)
MCH: 27.7 pg (ref 26.0–34.0)
MCHC: 30.9 g/dL (ref 30.0–36.0)
MCV: 89.6 fL (ref 80.0–100.0)
Platelets: 183 10*3/uL (ref 150–400)
RBC: 4.51 MIL/uL (ref 4.22–5.81)
RDW: 14.7 % (ref 11.5–15.5)
WBC: 6.9 10*3/uL (ref 4.0–10.5)
nRBC: 0 % (ref 0.0–0.2)

## 2024-04-26 LAB — BRAIN NATRIURETIC PEPTIDE: B Natriuretic Peptide: 367 pg/mL — ABNORMAL HIGH (ref 0.0–100.0)

## 2024-04-26 LAB — TROPONIN I (HIGH SENSITIVITY)
Troponin I (High Sensitivity): 14 ng/L (ref <18)
Troponin I (High Sensitivity): 12 ng/L (ref <18)

## 2024-04-26 LAB — MAGNESIUM: Magnesium: 2.1 mg/dL (ref 1.7–2.4)

## 2024-04-26 LAB — MRSA NEXT GEN BY PCR, NASAL: MRSA by PCR Next Gen: NOT DETECTED

## 2024-04-26 LAB — HEPARIN LEVEL (UNFRACTIONATED): Heparin Unfractionated: 0.2 [IU]/mL — ABNORMAL LOW (ref 0.30–0.70)

## 2024-04-26 MED ORDER — DILTIAZEM HCL-DEXTROSE 125-5 MG/125ML-% IV SOLN (PREMIX)
5.0000 mg/h | INTRAVENOUS | Status: DC
  Administered 2024-04-26: 10 mg/h via INTRAVENOUS
  Administered 2024-04-26: 5 mg/h via INTRAVENOUS
  Administered 2024-04-27: 15 mg/h via INTRAVENOUS
  Filled 2024-04-26 (×3): qty 125

## 2024-04-26 MED ORDER — HEPARIN (PORCINE) 25000 UT/250ML-% IV SOLN
1700.0000 [IU]/h | INTRAVENOUS | Status: DC
  Administered 2024-04-26: 1500 [IU]/h via INTRAVENOUS
  Administered 2024-04-27: 1700 [IU]/h via INTRAVENOUS
  Filled 2024-04-26 (×2): qty 250

## 2024-04-26 MED ORDER — ACETAMINOPHEN 650 MG RE SUPP
650.0000 mg | Freq: Four times a day (QID) | RECTAL | Status: DC | PRN
Start: 2024-04-26 — End: 2024-04-27

## 2024-04-26 MED ORDER — DILTIAZEM LOAD VIA INFUSION
10.0000 mg | Freq: Once | INTRAVENOUS | Status: AC
  Administered 2024-04-26: 10 mg via INTRAVENOUS
  Filled 2024-04-26: qty 10

## 2024-04-26 MED ORDER — ROSUVASTATIN CALCIUM 20 MG PO TABS
20.0000 mg | ORAL_TABLET | Freq: Every day | ORAL | Status: DC
  Administered 2024-04-26 – 2024-04-28 (×3): 20 mg via ORAL
  Filled 2024-04-26 (×3): qty 1

## 2024-04-26 MED ORDER — VENLAFAXINE HCL ER 150 MG PO CP24
150.0000 mg | ORAL_CAPSULE | Freq: Every day | ORAL | Status: DC
  Administered 2024-04-27 – 2024-04-30 (×4): 150 mg via ORAL
  Filled 2024-04-26 (×2): qty 1
  Filled 2024-04-26: qty 2
  Filled 2024-04-26 (×2): qty 1

## 2024-04-26 MED ORDER — ONDANSETRON HCL 4 MG/2ML IJ SOLN
4.0000 mg | Freq: Four times a day (QID) | INTRAMUSCULAR | Status: DC | PRN
  Administered 2024-04-27 (×2): 4 mg via INTRAVENOUS
  Filled 2024-04-26 (×2): qty 2

## 2024-04-26 MED ORDER — ACETAMINOPHEN 325 MG PO TABS
650.0000 mg | ORAL_TABLET | Freq: Four times a day (QID) | ORAL | Status: DC | PRN
  Administered 2024-04-27 (×2): 650 mg via ORAL
  Filled 2024-04-26 (×2): qty 2

## 2024-04-26 MED ORDER — CHLORHEXIDINE GLUCONATE CLOTH 2 % EX PADS
6.0000 | MEDICATED_PAD | Freq: Every day | CUTANEOUS | Status: DC
  Administered 2024-04-27 – 2024-05-03 (×6): 6 via TOPICAL

## 2024-04-26 MED ORDER — HEPARIN BOLUS VIA INFUSION
1500.0000 [IU] | Freq: Once | INTRAVENOUS | Status: AC
  Administered 2024-04-26: 1500 [IU] via INTRAVENOUS
  Filled 2024-04-26: qty 1500

## 2024-04-26 MED ORDER — ONDANSETRON HCL 4 MG PO TABS: 4.0000 mg | ORAL_TABLET | Freq: Four times a day (QID) | ORAL | Status: DC | PRN

## 2024-04-26 MED ORDER — FUROSEMIDE 10 MG/ML IJ SOLN
20.0000 mg | Freq: Once | INTRAMUSCULAR | Status: AC
  Administered 2024-04-26: 20 mg via INTRAVENOUS
  Filled 2024-04-26: qty 2

## 2024-04-26 MED ORDER — LACTATED RINGERS IV BOLUS
500.0000 mL | Freq: Once | INTRAVENOUS | Status: AC
  Administered 2024-04-26: 500 mL via INTRAVENOUS

## 2024-04-26 MED ORDER — HEPARIN BOLUS VIA INFUSION
5000.0000 [IU] | Freq: Once | INTRAVENOUS | Status: AC
  Administered 2024-04-26: 5000 [IU] via INTRAVENOUS

## 2024-04-26 NOTE — ED Triage Notes (Addendum)
 Pt sent from PCP for A-fib, hx of same, presented to PCP due to SOB

## 2024-04-26 NOTE — Hospital Course (Addendum)
 63 year old male with a history of anxiety/depression, hypertension paroxysmal atrial fibrillation, coronary artery disease, hyperlipidemia, dilated aortic root presenting with dyspnea on exertion for the better part of 2 months.  Patient had had 4-day history of suprapubic and left lower quadrant abdominal pain about 2 weeks ago.  He had an appointment to see his primary care provider for this.  His abdominal pain had resolved, but he was still having dyspnea on exertion for which he kept his appointment. During his visit with his outpatient provider on the morning of 04/26/2024, the patient was noted to have atrial fibrillation with RVR on EKG with heart rate in the 160s.  As result, the patient was sent to the emergency department for further evaluation and treatment. The patient denies any frank chest discomfort, coughing, hemoptysis, dizziness, syncope.    Notably, the patient states that he had a  "stomach bug" this past weekend with nausea, vomiting, and diarrhea.  He stated that it only lasted 24 hours and this has resolved.  He had fevers up to 100.8 F.  He states that this has improved as well.  There is no hematochezia, melena, dysuria, hematuria. He denies any new medications.  Regarding his atrial fibrillation, the patient had an episode of atrial fibrillation while he was visiting the ED July 2019.  He was cardioverted at that time and started on apixaban .  He remained in sinus rhythm and was subsequently taken off of apixaban  after a month.  He has not had recurrence of his atrial fibrillation since that time.  In the ED the patient was afebrile with stable oxygen saturation 97% room air.  Heart rate initially was in the 160s.  He started on diltiazem  drip with heart rate now in the 110s.  WBC 6.9, hemoglobin 12.5, platelets 183.  Sodium 139, open 24, serum creatinine 0.94.  Magnesium 2.1.  BNP 367.  Troponin 14.  UA negative for pyuria.  UDS negative.  EKG showed atrial fibrillation with RVR  and nonspecific T wave changes.  Patient was started on diltiazem  drip.  He was admitted for further evaluation and treatment.

## 2024-04-26 NOTE — ED Provider Notes (Signed)
 [place] EMERGENCY DEPARTMENT AT [facility] Provider Note   CSN: [id number] Arrival date & time: 04/26/24  1121     History  Chief Complaint  Patient presents with   Atrial Fibrillation    Jason Fleming is a 63 y.o. male.   Atrial Fibrillation Associated symptoms include shortness of breath.  Patient presenting for shortness of breath.  Medical history includes atrial fibrillation, depression, anxiety, HTN.  He states that last episode of rapid heart rate was in 2019.  He is no longer on any rate control or with any medication.  He has been experiencing exertional shortness of breath for the past 2 to 3 months.  He was also experiencing some recent lower abdominal pain.  Abdominal pain has resolved.  He did schedule appoint with his PCP in regards to his abdominal pain.  Because of his ongoing shortness of breath, he kept the appointment.  When he went to PCPs office, he was noted to have rapid heart rate in the 160s.  He was sent to the ED for further evaluation.  Patient denies any current symptoms at rest.     Home Medications Prior to Admission medications   Medication Sig Start Date End Date Taking? Authorizing Provider  amLODipine  (NORVASC ) 5 MG tablet TAKE 1 TABLET BY MOUTH DAILY 06/18/23   Austine Lefort, MD  aspirin  EC 81 MG tablet Take 1 tablet (81 mg total) by mouth daily. Swallow whole. 07/29/23   Austine Lefort, MD  Multiple Vitamin (MULTIVITAMIN) tablet Take 1 tablet by mouth daily.    [provider]  Omega-3 Fatty Acids (FISH OIL PO) Take by mouth.    [provider]  rosuvastatin  (CRESTOR ) 20 MG tablet Take 1 tablet (20 mg total) by mouth daily. 07/29/23   Austine Lefort, MD  venlafaxine  XR (EFFEXOR -XR) 150 MG 24 hr capsule Take 1 capsule (150 mg total) by mouth daily with breakfast. 06/23/23   Austine Lefort, MD      Allergies    Patient has no known allergies.    Review of Systems   Review of Systems  Constitutional:   Positive for fatigue.  Respiratory:  Positive for shortness of breath.   All other systems reviewed and are negative.   Physical Exam Updated Vital Signs BP (!) 116/91   Pulse (!) 113   Temp (!) 97.3 F (36.3 C) (Temporal)   Resp 16   Ht 6\' 2"  (1.88 m)   Wt 113.9 kg   SpO2 93%   BMI 32.23 kg/m  Physical Exam Vitals and nursing note reviewed.  Constitutional:      General: He is not in acute distress.    Appearance: Normal appearance. He is well-developed. He is not ill-appearing, toxic-appearing or diaphoretic.  HENT:     Head: Normocephalic and atraumatic.     Right Ear: External ear normal.     Left Ear: External ear normal.     Nose: Nose normal.     Mouth/Throat:     Mouth: Mucous membranes are moist.  Eyes:     Extraocular Movements: Extraocular movements intact.     Conjunctiva/sclera: Conjunctivae normal.  Cardiovascular:     Rate and Rhythm: Tachycardia present. Rhythm irregular.  Pulmonary:     Effort: Pulmonary effort is normal. No respiratory distress.  Abdominal:     General: There is no distension.     Palpations: Abdomen is soft.     Tenderness: There is no abdominal tenderness.  Musculoskeletal:  General: No swelling. Normal range of motion.     Cervical back: Normal range of motion and neck supple.  Skin:    General: Skin is warm and dry.     Coloration: Skin is not jaundiced or pale.  Neurological:     General: No focal deficit present.     Mental Status: He is alert and oriented to person, place, and time.  Psychiatric:        Mood and Affect: Mood normal.        Behavior: Behavior normal.     ED Results / Procedures / Treatments   Labs (all labs ordered are listed, but only abnormal results are displayed) Labs Reviewed  BASIC METABOLIC PANEL WITH GFR - Abnormal; Notable for the following components:      Result Value   Calcium  8.7 (*)    All other components within normal limits  CBC - Abnormal; Notable for the following  components:   Hemoglobin 12.5 (*)    All other components within normal limits  BRAIN NATRIURETIC PEPTIDE - Abnormal; Notable for the following components:   B Natriuretic Peptide 367.0 (*)    All other components within normal limits  URINALYSIS, ROUTINE W REFLEX MICROSCOPIC - Abnormal; Notable for the following components:   Protein, ur 100 (*)    All other components within normal limits  MAGNESIUM  RAPID URINE DRUG SCREEN, HOSP PERFORMED  TROPONIN I (HIGH SENSITIVITY)  TROPONIN I (HIGH SENSITIVITY)    EKG EKG Interpretation Date/Time:  Monday April 26 2024 11:40:26 EDT Ventricular Rate:  169 PR Interval:    QRS Duration:  94 QT Interval:  268 QTC Calculation: 449 R Axis:   71  Text Interpretation: Atrial fibrillation with rapid ventricular response Anterior infarct , age undetermined Confirmed by Iva Mariner [phone]) on 04/26/2024 12:39:52 PM  Radiology No results found.  Procedures Procedures    Medications Ordered in ED Medications  diltiazem  (CARDIZEM ) 1 mg/mL load via infusion 10 mg (10 mg Intravenous Bolus from Bag 04/26/24 1258)    And  diltiazem  (CARDIZEM ) 125 mg in dextrose  5% 125 mL (1 mg/mL) infusion (15 mg/hr Intravenous Rate/Dose Change 04/26/24 1407)  furosemide (LASIX) injection 20 mg (has no administration in time range)  lactated ringers bolus 500 mL (500 mLs Intravenous Bolus 04/26/24 1301)    ED Course/ Medical Decision Making/ A&P                                 Medical Decision Making Amount and/or Complexity of Data Reviewed Labs: ordered. Radiology: ordered.  Risk Prescription drug management. Decision regarding hospitalization.   This patient presents to the ED for concern of shortness of breath, this involves an extensive number of treatment options, and is a complaint that carries with it a high risk of complications and morbidity.  The differential diagnosis includes arrhythmia, metabolic derangements, anemia, acidosis, pneumonia, CHF   Co  morbidities / Chronic conditions that complicate the patient evaluation  Anxiety, depression, atrial fibrillation, HTN   Additional history obtained:  Additional history obtained from EMR External records from outside source obtained and reviewed including in a   Lab Tests:  I Ordered, and personally interpreted labs.  The pertinent results include: Normal kidney function, normal electrolytes, no leukocytosis, normal troponin.  BNP is elevated suggestive of possibly undiagnosed CHF.   Imaging Studies ordered:  I ordered imaging studies including chest x-ray   Cardiac Monitoring: / EKG:  The patient was maintained on a cardiac monitor.  I personally viewed and interpreted the cardiac monitored which showed an underlying rhythm of: Atrial fibrillation   Problem List / ED Course / Critical interventions / Medication management  Patient presenting for ongoing exertional dyspnea, identified rapid heart rate at PCPs office today.  Heart rate on arrival was in the range of 150s to 160s.  Despite this, he is well-appearing on exam.  EKG shows atrial fibrillation with RVR.  He does have a remote history of this and is not currently on any AV nodal or blood thinning agents.  Current blood pressure in the range of 130s over 90s.  Cardizem  was ordered for rate control.   Workup was initiated.  Patient's lab work was notable for elevated BNP.  Patient may have an element of undiagnosed CHF.  Although IV fluids were ordered initially, prior to lab work results, now dose of IV Lasix was ordered to reduce atrial stretch.  On reassessment, patient resting comfortably.  On 10 mg/h of Cardizem , heart rate has improved and now ranges from 110s to 130s.  He remains normotensive.  Patient to be admitted for further management. I ordered medication including Cardizem  for rate control; IV Lasix for diuresis; heparin for atrial fibrillation Reevaluation of the patient after these medicines showed that the  patient improved I have reviewed the patients home medicines and have made adjustments as needed   Social Determinants of Health:  Lives independently  CRITICAL CARE Performed by: Iva Mariner   Total critical care time: 32 minutes  Critical care time was exclusive of separately billable procedures and treating other patients.  Critical care was necessary to treat or prevent imminent or life-threatening deterioration.  Critical care was time spent personally by me on the following activities: development of treatment plan with patient and/or surrogate as well as nursing, discussions with consultants, evaluation of patient's response to treatment, examination of patient, obtaining history from patient or surrogate, ordering and performing treatments and interventions, ordering and review of laboratory studies, ordering and review of radiographic studies, pulse oximetry and re-evaluation of patient's condition.         Final Clinical Impression(s) / ED Diagnoses Final diagnoses:  Atrial fibrillation with rapid ventricular response [facility])    Rx / DC Orders ED Discharge Orders     None         Iva Mariner, MD 04/26/24 1550

## 2024-04-26 NOTE — Progress Notes (Signed)
 PHARMACY - ANTICOAGULATION CONSULT NOTE  Pharmacy Consult for heparin Indication: atrial fibrillation  No Known Allergies  Patient Measurements: Height: 6\' 2"  (188 cm) Weight: 114.8 kg (253 lb 1.4 oz) IBW/kg (Calculated) : 82.2 HEPARIN DW (KG): 106.4  Vital Signs: Temp: 97.8 F (36.6 C) (06/02 2020) Temp Source: Axillary (06/02 2020) BP: 101/71 (06/02 2200) Pulse Rate: 86 (06/02 2300)  Labs: Recent Labs    04/26/24 1157 04/26/24 1424 04/26/24 2204  HGB 12.5*  --   --   HCT 40.4  --   --   PLT 183  --   --   HEPARINUNFRC  --   --  0.20*  CREATININE 0.94  --   --   TROPONINIHS 14 12  --     Estimated Creatinine Clearance: 109.7 mL/min (by C-G formula based on SCr of 0.94 mg/dL).   Medical History: Past Medical History:  Diagnosis Date   Anxiety    Atrial fibrillation (HCC)    Depression    Hypertension    Paroxysmal atrial fibrillation (HCC)    cardioversion in ed 05/2018    Medications:  Medications Prior to Admission  Medication Sig Dispense Refill Last Dose/Taking   amLODipine  (NORVASC ) 5 MG tablet TAKE 1 TABLET BY MOUTH DAILY 90 tablet 3    aspirin  EC 81 MG tablet Take 1 tablet (81 mg total) by mouth daily. Swallow whole.      Multiple Vitamin (MULTIVITAMIN) tablet Take 1 tablet by mouth daily.      Omega-3 Fatty Acids (FISH OIL PO) Take by mouth.      rosuvastatin  (CRESTOR ) 20 MG tablet Take 1 tablet (20 mg total) by mouth daily. 90 tablet 3    venlafaxine  XR (EFFEXOR -XR) 150 MG 24 hr capsule Take 1 capsule (150 mg total) by mouth daily with breakfast. 30 capsule 0     Assessment: Pharmacy consulted to dose heparin in patient with atrial fibrillation.  Patient is not on anticoagulation prior to admission.  CBC WNL  6/2 PM update: HL 0.20 No signs of bleeding / pauses in gtt reported  Goal of Therapy:  Heparin level 0.3-0.7 units/ml Monitor platelets by anticoagulation protocol: Yes   Plan:  Give 1500 units bolus x 1 Increase heparin infusion  to 1700 units/hr Check anti-Xa level in 8 hours and daily while on heparin Continue to monitor H&H and platelets  Reighlynn Swiney BS, PharmD, BCPS Clinical Pharmacist 04/26/2024 11:21 PM  Contact: [phone] after 3 PM  "Be curious, not judgmental..." -Rumalda Counter

## 2024-04-26 NOTE — Progress Notes (Signed)
 PHARMACY - ANTICOAGULATION CONSULT NOTE  Pharmacy Consult for heparin Indication: atrial fibrillation  No Known Allergies  Patient Measurements: Height: 6\' 2"  (188 cm) Weight: 113.9 kg (251 lb) IBW/kg (Calculated) : 82.2 HEPARIN DW (KG): 106.1  Vital Signs: Temp: 97.3 F (36.3 C) (06/02 1135) Temp Source: Temporal (06/02 1135) BP: 116/91 (06/02 1410) Pulse Rate: 113 (06/02 1410)  Labs: Recent Labs    04/26/24 1157  HGB 12.5*  HCT 40.4  PLT 183  CREATININE 0.94  TROPONINIHS 14    Estimated Creatinine Clearance: 109.4 mL/min (by C-G formula based on SCr of 0.94 mg/dL).   Medical History: Past Medical History:  Diagnosis Date   Anxiety    Atrial fibrillation (HCC)    Depression    Hypertension    Paroxysmal atrial fibrillation (HCC)    cardioversion in ed 05/2018    Medications:  (Not in a hospital admission)   Assessment: Pharmacy consulted to dose heparin in patient with atrial fibrillation.  Patient is not on anticoagulation prior to admission.  CBC WNL  Goal of Therapy:  Heparin level 0.3-0.7 units/ml Monitor platelets by anticoagulation protocol: Yes   Plan:  Give 5000 units bolus x 1 Start heparin infusion at 1500 units/hr Check anti-Xa level in 6-8 hours and daily while on heparin Continue to monitor H&H and platelets  Cliffton Dama, PharmD Clinical Pharmacist 04/26/2024 2:24 PM

## 2024-04-26 NOTE — H&P (Addendum)
 History and Physical    Patient: Jason Fleming:[id number] DOB: May 06, 1961 DOA: 04/26/2024 DOS: the patient was seen and examined on 04/26/2024 PCP: Jason Lefort, MD  Patient coming from: Home  Chief Complaint:  Chief Complaint  Patient presents with   Atrial Fibrillation   HPI: Jason Fleming is a 63 year old male with a history of anxiety/depression, hypertension paroxysmal atrial fibrillation, coronary artery disease, hyperlipidemia, dilated aortic root presenting with dyspnea on exertion for the better part of 2 months.  Patient had had 4-day history of suprapubic and left lower quadrant abdominal pain about 2 weeks ago.  He had an appointment to see his primary care provider for this.  His abdominal pain had resolved, but he was still having dyspnea on exertion for which he kept his appointment. During his visit with his outpatient provider on the morning of 04/26/2024, the patient was noted to have atrial fibrillation with RVR on EKG with heart rate in the 160s.  As result, the patient was sent to the emergency department for further evaluation and treatment. The patient denies any frank chest discomfort, coughing, hemoptysis, dizziness, syncope.    Notably, the patient states that he had a  "stomach bug" this past weekend with nausea, vomiting, and diarrhea.  He stated that it only lasted 24 hours and this has resolved.  He had fevers up to 100.8 F.  He states that this has improved as well.  There is no hematochezia, melena, dysuria, hematuria. He denies any new medications.  Regarding his atrial fibrillation, the patient had an episode of atrial fibrillation while he was visiting the ED July 2019.  He was cardioverted at that time and started on apixaban .  He remained in sinus rhythm and was subsequently taken off of apixaban  after a month.  He has not had recurrence of his atrial fibrillation since that time.  In the ED the patient was afebrile with stable oxygen saturation 97%  room air.  Heart rate initially was in the 160s.  He started on diltiazem  drip with heart rate now in the 110s.  WBC 6.9, hemoglobin 12.5, platelets 183.  Sodium 139, open 24, serum creatinine 0.94.  Magnesium 2.1.  BNP 367.  Troponin 14.  UA negative for pyuria.  UDS negative.  EKG showed atrial fibrillation with RVR and nonspecific T wave changes.  Patient was started on diltiazem  drip.  He was admitted for further evaluation and treatment.  Review of Systems: As mentioned in the history of present illness. All other systems reviewed and are negative. Past Medical History:  Diagnosis Date   Anxiety    Atrial fibrillation (HCC)    Depression    Hypertension    Paroxysmal atrial fibrillation (HCC)    cardioversion in ed 05/2018   Past Surgical History:  Procedure Laterality Date   CARPAL TUNNEL RELEASE     COLONOSCOPY  2013   Dr.Hung-normal exam   MASS EXCISION  05/26/2012   Procedure: MINOR EXCISION OF MASS;  Surgeon: Amelie Baize., MD;  Location: [facility];  Service: Orthopedics;  Laterality: Left;   TONSILLECTOMY     ULNAR NERVE REPAIR Bilateral    WISDOM TOOTH EXTRACTION     Social History:  reports that he quit smoking about 12 years ago. His smoking use included e-cigarettes. He has never used smokeless tobacco. He reports that he does not currently use alcohol after a past usage of about 3.0 standard drinks of alcohol per week. He reports that he  does not use drugs.  No Known Allergies  Family History  Problem Relation Age of Onset   Anxiety disorder Mother    Hypertension Mother    Depression Father    Heart disease Father    CAD Father        CABG x 3 in 2000   Stroke Maternal Grandfather    Alcohol abuse Maternal Uncle    Alcohol abuse Paternal Uncle    Colon cancer Neg Hx    Colon polyps Neg Hx    Esophageal cancer Neg Hx    Stomach cancer Neg Hx    Rectal cancer Neg Hx     Prior to Admission medications   Medication Sig Start Date End  Date Taking? Authorizing Provider  amLODipine  (NORVASC ) 5 MG tablet TAKE 1 TABLET BY MOUTH DAILY 06/18/23   Jason Lefort, MD  aspirin  EC 81 MG tablet Take 1 tablet (81 mg total) by mouth daily. Swallow whole. 07/29/23   Jason Lefort, MD  Multiple Vitamin (MULTIVITAMIN) tablet Take 1 tablet by mouth daily.    [provider]  Omega-3 Fatty Acids (FISH OIL PO) Take by mouth.    [provider]  rosuvastatin  (CRESTOR ) 20 MG tablet Take 1 tablet (20 mg total) by mouth daily. 07/29/23   Jason Lefort, MD  venlafaxine  XR (EFFEXOR -XR) 150 MG 24 hr capsule Take 1 capsule (150 mg total) by mouth daily with breakfast. 06/23/23   Jason Lefort, MD    Physical Exam: Vitals:   04/26/24 1330 04/26/24 1340 04/26/24 1400 04/26/24 1410  BP: (!) 118/93 (!) 125/94 100/85 (!) 116/91  Pulse: 77 (!) 106 97 (!) 113  Resp: 13 15 19 16   Temp:      TempSrc:      SpO2: 97% 94% 93% 93%  Weight:      Height:      GENERAL:  A&O x 3, NAD, well developed, cooperative, follows commands HEENT: [place]/AT, No thrush, No icterus, No oral ulcers Neck:  No neck mass, No meningismus, soft, supple CV: IRRR, no S3, no S4, no rub, no JVD Lungs:  CTA, no wheeze, no rhonchi, good air movement Abd: soft/NT +BS, nondistended Ext: trace LE edema, no lymphangitis, no cyanosis, no rashes Neuro:  CN II-XII intact, strength 4/5 in RUE, RLE, strength 4/5 LUE, LLE; sensation intact bilateral; no dysmetria; babinski equivocal  Data Reviewed: Data reviewed above in history  Assessment and Plan: Paroxysmal atrial fibrillation with RVR - Diltiazem  drip - Echocardiogram - TSH - CHADSVASC2 = 1 - Continue IV heparin for now  Essential hypertension - Discontinue amlodipine   Coronary artery disease -no chest pain -started AC in lieu of ASA -CT calcium  score revealed coronary calcium  score of 479 (89th percentile) with the bulk of plaque in LAD alone   Mixed hyperlipidemia -Continue  statin  Obesity -BMI 32.23 -Lifestyle modification    Advance Care Planning: FULL  Consults: cardiology  Family Communication: spouse updated 6/2  Severity of Illness: The appropriate patient status for this patient is OBSERVATION. Observation status is judged to be reasonable and necessary in order to provide the required intensity of service to ensure the patient's safety. The patient's presenting symptoms, physical exam findings, and initial radiographic and laboratory data in the context of their medical condition is felt to place them at decreased risk for further clinical deterioration. Furthermore, it is anticipated that the patient will be medically stable for discharge from the hospital within 2 midnights of admission.  Author: Demaris Fillers, MD 04/26/2024 3:05 PM  For on call review [email or web address].

## 2024-04-26 NOTE — Progress Notes (Signed)
 Subjective:     Patient ID: Jason Fleming, male   DOB: May 24, 1961, 63 y.o.   MRN: [id number]  Abdominal Pain  Anxiety    Patient originally made appointment for a pain in his lower abdomen.  However he states that that has subsided.  He was going to cancel his appointment however he states that he has been feeling anxious and more short of breath recently and jittery.  Patient however was found to be in tachycardia on presentation.  Heart rate is 140 on exam.  It seems rapid and irregular.  He denies any chest pain or shortness of breath at rest.  He denies any syncope or near syncope.  He has a history of paroxysmal atrial fibrillation that was treated with cardioversion in 2019.  Patient recently had a coronary artery calcium  score of 479 with all of the plaque in the LAD.  Cardiology was consulted.  Insurance declined CT scan.  Cardiology recommended a exercise Myoview.  However patient has not performed that testing yet.  EKG today shows atrial fibrillation with rapid ventricular response.  Heart rate is 170 bpm.  Fortunately there is no evidence of ischemia on the EKG.  Nonspecific ST changes in aVL. Past Medical History:  Diagnosis Date   Anxiety    Depression    Hypertension    Paroxysmal atrial fibrillation (HCC)    cardioversion in ed 05/2018   Past Surgical History:  Procedure Laterality Date   CARPAL TUNNEL RELEASE     COLONOSCOPY  2013   Dr.Hung-normal exam   MASS EXCISION  05/26/2012   Procedure: MINOR EXCISION OF MASS;  Surgeon: Amelie Baize., MD;  Location: [facility];  Service: Orthopedics;  Laterality: Left;   TONSILLECTOMY     WISDOM TOOTH EXTRACTION     Current Outpatient Medications on File Prior to Visit  Medication Sig Dispense Refill   amLODipine  (NORVASC ) 5 MG tablet TAKE 1 TABLET BY MOUTH DAILY 90 tablet 3   aspirin  EC 81 MG tablet Take 1 tablet (81 mg total) by mouth daily. Swallow whole.     Multiple Vitamin (MULTIVITAMIN) tablet Take  1 tablet by mouth daily.     Omega-3 Fatty Acids (FISH OIL PO) Take by mouth.     rosuvastatin  (CRESTOR ) 20 MG tablet Take 1 tablet (20 mg total) by mouth daily. 90 tablet 3   venlafaxine  XR (EFFEXOR -XR) 150 MG 24 hr capsule Take 1 capsule (150 mg total) by mouth daily with breakfast. 30 capsule 0   No current facility-administered medications on file prior to visit.   No Known Allergies Social History   Socioeconomic History   Marital status: Married    Spouse name: Not on file   Number of children: Not on file   Years of education: Not on file   Highest education level: 12th grade  Occupational History   Not on file  Tobacco Use   Smoking status: Every Day    Types: E-cigarettes    Last attempt to quit: 03/02/2012    Years since quitting: 12.1   Smokeless tobacco: Never  Vaping Use   Vaping status: Every Day  Substance and Sexual Activity   Alcohol use: Yes    Alcohol/week: 3.0 standard drinks of alcohol    Types: 3 Standard drinks or equivalent per week   Drug use: No   Sexual activity: Yes  Other Topics Concern   Not on file  Social History Narrative   Not on file  Social Drivers of Corporate investment banker Strain: Low Risk  (04/25/2024)   Overall Financial Resource Strain (CARDIA)    Difficulty of Paying Living Expenses: Not hard at all  Food Insecurity: No Food Insecurity (04/25/2024)   Hunger Vital Sign    Worried About Running Out of Food in the Last Year: Never true    Ran Out of Food in the Last Year: Never true  Transportation Needs: No Transportation Needs (04/25/2024)   PRAPARE - Administrator, Civil Service (Medical): No    Lack of Transportation (Non-Medical): No  Physical Activity: Inactive (04/25/2024)   Exercise Vital Sign    Days of Exercise per Week: 0 days    Minutes of Exercise per Session: 0 min  Stress: Stress Concern Present (04/25/2024)   [employer] of Occupational Health - Occupational Stress Questionnaire    Feeling of  Stress : Rather much  Social Connections: Moderately Integrated (04/25/2024)   Social Connection and Isolation Panel [NHANES]    Frequency of Communication with Friends and Family: More than three times a week    Frequency of Social Gatherings with Friends and Family: Twice a week    Attends Religious Services: More than 4 times per year    Active Member of [employer] or Organizations: No    Attends Banker Meetings: Never    Marital Status: Married  Catering manager Violence: Not At Risk (08/13/2023)   Humiliation, Afraid, Rape, and Kick questionnaire    Fear of Current or Ex-Partner: No    Emotionally Abused: No    Physically Abused: No    Sexually Abused: No   Family History  Problem Relation Age of Onset   Anxiety disorder Mother    Hypertension Mother    Depression Father    Heart disease Father    CAD Father        CABG x 3 in 2000   Stroke Maternal Grandfather    Alcohol abuse Maternal Uncle    Alcohol abuse Paternal Uncle    Colon cancer Neg Hx    Colon polyps Neg Hx    Esophageal cancer Neg Hx    Stomach cancer Neg Hx    Rectal cancer Neg Hx     Review of Systems  Gastrointestinal:  Positive for abdominal pain.  All other systems reviewed and are negative.      Objective:   Physical Exam Vitals reviewed.  Constitutional:      General: He is not in acute distress.    Appearance: Normal appearance. He is well-developed. He is not ill-appearing, toxic-appearing or diaphoretic.  HENT:     Head: Normocephalic and atraumatic.  Eyes:     Conjunctiva/sclera: Conjunctivae normal.     Pupils: Pupils are equal, round, and reactive to light.  Neck:     Thyroid : No thyromegaly.     Vascular: No JVD.  Cardiovascular:     Rate and Rhythm: Tachycardia present. Rhythm irregular.     Heart sounds: No murmur heard.    No friction rub. No gallop.  Pulmonary:     Effort: Pulmonary effort is normal. No respiratory distress.     Breath sounds: Normal breath sounds.  No stridor. No wheezing or rales.  Chest:     Chest wall: No tenderness.  Abdominal:     General: Bowel sounds are normal. There is no distension.     Palpations: Abdomen is soft. There is no mass.     Tenderness: There is no  abdominal tenderness. There is no guarding or rebound.     Hernia: No hernia is present.  Musculoskeletal:     Right lower leg: No edema.     Left lower leg: No edema.  Skin:    Findings: No rash.  Neurological:     Mental Status: He is alert and oriented to person, place, and time.     Cranial Nerves: No cranial nerve deficit.     Motor: No abnormal muscle tone.     Coordination: Coordination normal.     Deep Tendon Reflexes: Reflexes normal.  Psychiatric:        Mood and Affect: Mood normal.        Behavior: Behavior normal.   Despite his rapid heartbeat, the patient is comfortable and relaxed and in no distress.     Assessment:     Tachycardia - Plan: EKG 12-Lead, CBC with Differential/Platelet, Comprehensive metabolic panel with GFR, TSH    Plan:  Patient is in atrial fibrillation with rapid ventricular response.  He does feel short of breath.  He denies syncope or presyncope.  EKG shows no evidence of ischemia.  We discussed a trial of metoprolol  as an outpatient versus going to the emergency room for IV medication to slow the heart rate down immediately under supervision.  Patient feels more comfortable going to the emergency room.  Despite his tachycardia, he is stable and I feel can drive to the emergency room through his own personal transport.

## 2024-04-26 NOTE — ED Notes (Signed)
 MD at bedside.

## 2024-04-26 NOTE — Plan of Care (Signed)
  Problem: Education: Goal: Knowledge of General Education information will improve Description: Including pain rating scale, medication(s)/side effects and non-pharmacologic comfort measures Outcome: Progressing   Problem: Health Behavior/Discharge Planning: Goal: Ability to manage health-related needs will improve Outcome: Progressing   Problem: Clinical Measurements: Goal: Ability to maintain clinical measurements within normal limits will improve Outcome: Progressing Goal: Will remain free from infection Outcome: Progressing Goal: Diagnostic test results will improve Outcome: Progressing Goal: Respiratory complications will improve Outcome: Progressing Goal: Cardiovascular complication will be avoided Outcome: Not Progressing   Problem: Activity: Goal: Risk for activity intolerance will decrease Outcome: Progressing   Problem: Nutrition: Goal: Adequate nutrition will be maintained Outcome: Progressing   Problem: Coping: Goal: Level of anxiety will decrease Outcome: Progressing   Problem: Elimination: Goal: Will not experience complications related to bowel motility Outcome: Progressing Goal: Will not experience complications related to urinary retention Outcome: Progressing   Problem: Pain Managment: Goal: General experience of comfort will improve and/or be controlled Outcome: Progressing   Problem: Safety: Goal: Ability to remain free from injury will improve Outcome: Progressing   Problem: Skin Integrity: Goal: Risk for impaired skin integrity will decrease Outcome: Progressing

## 2024-04-27 ENCOUNTER — Other Ambulatory Visit (HOSPITAL_COMMUNITY): Payer: Self-pay | Admitting: *Deleted

## 2024-04-27 ENCOUNTER — Inpatient Hospital Stay (HOSPITAL_COMMUNITY)

## 2024-04-27 ENCOUNTER — Inpatient Hospital Stay: Payer: Self-pay

## 2024-04-27 ENCOUNTER — Other Ambulatory Visit (HOSPITAL_COMMUNITY): Payer: Self-pay

## 2024-04-27 ENCOUNTER — Telehealth (HOSPITAL_COMMUNITY): Payer: Self-pay | Admitting: Pharmacy Technician

## 2024-04-27 DIAGNOSIS — I340 Nonrheumatic mitral (valve) insufficiency: Secondary | ICD-10-CM | POA: Diagnosis not present

## 2024-04-27 DIAGNOSIS — R570 Cardiogenic shock: Secondary | ICD-10-CM | POA: Diagnosis present

## 2024-04-27 DIAGNOSIS — E669 Obesity, unspecified: Secondary | ICD-10-CM | POA: Diagnosis present

## 2024-04-27 DIAGNOSIS — Z7901 Long term (current) use of anticoagulants: Secondary | ICD-10-CM | POA: Diagnosis not present

## 2024-04-27 DIAGNOSIS — N179 Acute kidney failure, unspecified: Secondary | ICD-10-CM | POA: Diagnosis present

## 2024-04-27 DIAGNOSIS — Z818 Family history of other mental and behavioral disorders: Secondary | ICD-10-CM | POA: Diagnosis not present

## 2024-04-27 DIAGNOSIS — Z8249 Family history of ischemic heart disease and other diseases of the circulatory system: Secondary | ICD-10-CM | POA: Diagnosis not present

## 2024-04-27 DIAGNOSIS — I4891 Unspecified atrial fibrillation: Secondary | ICD-10-CM

## 2024-04-27 DIAGNOSIS — Z743 Need for continuous supervision: Secondary | ICD-10-CM | POA: Diagnosis not present

## 2024-04-27 DIAGNOSIS — Z6833 Body mass index (BMI) 33.0-33.9, adult: Secondary | ICD-10-CM | POA: Diagnosis not present

## 2024-04-27 DIAGNOSIS — K7200 Acute and subacute hepatic failure without coma: Secondary | ICD-10-CM | POA: Diagnosis present

## 2024-04-27 DIAGNOSIS — Z823 Family history of stroke: Secondary | ICD-10-CM | POA: Diagnosis not present

## 2024-04-27 DIAGNOSIS — I2510 Atherosclerotic heart disease of native coronary artery without angina pectoris: Secondary | ICD-10-CM | POA: Diagnosis present

## 2024-04-27 DIAGNOSIS — E876 Hypokalemia: Secondary | ICD-10-CM | POA: Diagnosis present

## 2024-04-27 DIAGNOSIS — I272 Pulmonary hypertension, unspecified: Secondary | ICD-10-CM | POA: Diagnosis present

## 2024-04-27 DIAGNOSIS — I509 Heart failure, unspecified: Secondary | ICD-10-CM | POA: Diagnosis present

## 2024-04-27 DIAGNOSIS — Z0189 Encounter for other specified special examinations: Secondary | ICD-10-CM | POA: Diagnosis not present

## 2024-04-27 DIAGNOSIS — Z87891 Personal history of nicotine dependence: Secondary | ICD-10-CM | POA: Diagnosis not present

## 2024-04-27 DIAGNOSIS — I5021 Acute systolic (congestive) heart failure: Secondary | ICD-10-CM | POA: Diagnosis not present

## 2024-04-27 DIAGNOSIS — I5023 Acute on chronic systolic (congestive) heart failure: Secondary | ICD-10-CM | POA: Diagnosis present

## 2024-04-27 DIAGNOSIS — I428 Other cardiomyopathies: Secondary | ICD-10-CM | POA: Diagnosis present

## 2024-04-27 DIAGNOSIS — I480 Paroxysmal atrial fibrillation: Secondary | ICD-10-CM | POA: Diagnosis present

## 2024-04-27 DIAGNOSIS — Z79899 Other long term (current) drug therapy: Secondary | ICD-10-CM | POA: Diagnosis not present

## 2024-04-27 DIAGNOSIS — E782 Mixed hyperlipidemia: Secondary | ICD-10-CM | POA: Diagnosis present

## 2024-04-27 DIAGNOSIS — I110 Hypertensive heart disease with heart failure: Secondary | ICD-10-CM | POA: Diagnosis present

## 2024-04-27 DIAGNOSIS — R579 Shock, unspecified: Secondary | ICD-10-CM | POA: Diagnosis present

## 2024-04-27 DIAGNOSIS — Z7982 Long term (current) use of aspirin: Secondary | ICD-10-CM | POA: Diagnosis not present

## 2024-04-27 LAB — BASIC METABOLIC PANEL WITH GFR
Anion gap: 8 (ref 5–15)
Anion gap: 10 (ref 5–15)
BUN: 13 mg/dL (ref 8–23)
BUN: 14 mg/dL (ref 8–23)
CO2: 24 mmol/L (ref 22–32)
CO2: 18 mmol/L — ABNORMAL LOW (ref 22–32)
Calcium: 8.9 mg/dL (ref 8.9–10.3)
Calcium: 8.2 mg/dL — ABNORMAL LOW (ref 8.9–10.3)
Chloride: 104 mmol/L (ref 98–111)
Chloride: 106 mmol/L (ref 98–111)
Creatinine, Ser: 0.86 mg/dL (ref 0.61–1.24)
Creatinine, Ser: 1.39 mg/dL — ABNORMAL HIGH (ref 0.61–1.24)
GFR, Estimated: >60 mL/min (ref >60)
GFR, Estimated: 57 mL/min — ABNORMAL LOW (ref >60)
Glucose, Bld: 101 mg/dL — ABNORMAL HIGH (ref 70–99)
Glucose, Bld: 139 mg/dL — ABNORMAL HIGH (ref 70–99)
Potassium: 3.4 mmol/L — ABNORMAL LOW (ref 3.5–5.1)
Potassium: 4 mmol/L (ref 3.5–5.1)
Sodium: 136 mmol/L (ref 135–145)
Sodium: 134 mmol/L — ABNORMAL LOW (ref 135–145)

## 2024-04-27 LAB — ECHOCARDIOGRAM COMPLETE
AR max vel: 2.28 cm2
AV Area VTI: 2.5 cm2
AV Area mean vel: 2.16 cm2
AV Mean grad: 1 mmHg
AV Peak grad: 2.7 mmHg
Ao pk vel: 0.81 m/s
Area-P 1/2: 5.02 cm2
Height: 74 in
MV M vel: 3.58 m/s
MV Peak grad: 51.3 mmHg
MV VTI: 1.3 cm2
S' Lateral: 4.6 cm
Single Plane A4C EF: 49.4 %
Weight: 4049.41 [oz_av]

## 2024-04-27 LAB — CBC
HCT: 40.4 % (ref 39.0–52.0)
HCT: 40.2 % (ref 39.0–52.0)
Hemoglobin: 12.9 g/dL — ABNORMAL LOW (ref 13.0–17.0)
Hemoglobin: 12.2 g/dL — ABNORMAL LOW (ref 13.0–17.0)
MCH: 28 pg (ref 26.0–34.0)
MCH: 27.5 pg (ref 26.0–34.0)
MCHC: 31.9 g/dL (ref 30.0–36.0)
MCHC: 30.3 g/dL (ref 30.0–36.0)
MCV: 87.6 fL (ref 80.0–100.0)
MCV: 90.7 fL (ref 80.0–100.0)
Platelets: 168 10*3/uL (ref 150–400)
Platelets: 176 10*3/uL (ref 150–400)
RBC: 4.61 MIL/uL (ref 4.22–5.81)
RBC: 4.43 MIL/uL (ref 4.22–5.81)
RDW: 14.7 % (ref 11.5–15.5)
RDW: 14.7 % (ref 11.5–15.5)
WBC: 7.6 10*3/uL (ref 4.0–10.5)
WBC: 7.2 10*3/uL (ref 4.0–10.5)
nRBC: 0 % (ref 0.0–0.2)
nRBC: 0 % (ref 0.0–0.2)

## 2024-04-27 LAB — COOXEMETRY PANEL
Carboxyhemoglobin: <0.3 % — ABNORMAL LOW (ref 0.5–1.5)
Methemoglobin: <0.7 % (ref 0.0–1.5)
O2 Saturation: 26.3 %
Total hemoglobin: 12.5 g/dL (ref 12.0–16.0)

## 2024-04-27 LAB — CORTISOL: Cortisol, Plasma: 30.7 ug/dL

## 2024-04-27 LAB — HEPARIN LEVEL (UNFRACTIONATED): Heparin Unfractionated: 0.34 [IU]/mL (ref 0.30–0.70)

## 2024-04-27 LAB — MRSA NEXT GEN BY PCR, NASAL: MRSA by PCR Next Gen: NOT DETECTED

## 2024-04-27 LAB — HIV ANTIBODY (ROUTINE TESTING W REFLEX): HIV Screen 4th Generation wRfx: NONREACTIVE

## 2024-04-27 LAB — T4, FREE: Free T4: 1.03 ng/dL (ref 0.61–1.12)

## 2024-04-27 LAB — GLUCOSE, CAPILLARY: Glucose-Capillary: 120 mg/dL — ABNORMAL HIGH (ref 70–99)

## 2024-04-27 LAB — LACTIC ACID, PLASMA: Lactic Acid, Venous: 3.4 mmol/L (ref 0.5–1.9)

## 2024-04-27 LAB — PROCALCITONIN: Procalcitonin: 0.11 ng/mL

## 2024-04-27 LAB — TSH: TSH: 1.558 u[IU]/mL (ref 0.350–4.500)

## 2024-04-27 LAB — MAGNESIUM: Magnesium: 2.3 mg/dL (ref 1.7–2.4)

## 2024-04-27 MED ORDER — VANCOMYCIN HCL 1750 MG/350ML IV SOLN: 1750.0000 mg | INTRAVENOUS | Status: DC

## 2024-04-27 MED ORDER — CALCIUM GLUCONATE-NACL 1-0.675 GM/50ML-% IV SOLN
1.0000 g | Freq: Once | INTRAVENOUS | Status: AC
  Administered 2024-04-27: 1000 mg via INTRAVENOUS
  Filled 2024-04-27: qty 50

## 2024-04-27 MED ORDER — ORAL CARE MOUTH RINSE
15.0000 mL | OROMUCOSAL | Status: DC | PRN
Start: 2024-04-27 — End: 2024-05-03
  Administered 2024-05-01: 15 mL via OROMUCOSAL

## 2024-04-27 MED ORDER — SODIUM CHLORIDE 0.9% FLUSH: 10.0000 mL | INTRAVENOUS | Status: DC | PRN

## 2024-04-27 MED ORDER — SODIUM CHLORIDE 0.9 % IV BOLUS
500.0000 mL | Freq: Once | INTRAVENOUS | Status: AC
  Administered 2024-04-27: 500 mL via INTRAVENOUS

## 2024-04-27 MED ORDER — FUROSEMIDE 10 MG/ML IJ SOLN
20.0000 mg | Freq: Once | INTRAMUSCULAR | Status: AC
  Administered 2024-04-27: 20 mg via INTRAVENOUS
  Filled 2024-04-27: qty 2

## 2024-04-27 MED ORDER — SODIUM CHLORIDE 0.9 % IV SOLN
250.0000 mL | INTRAVENOUS | Status: AC
  Administered 2024-04-27: 250 mL via INTRAVENOUS

## 2024-04-27 MED ORDER — NOREPINEPHRINE 4 MG/250ML-% IV SOLN
0.0000 ug/min | INTRAVENOUS | Status: DC
  Administered 2024-04-27: 3 ug/min via INTRAVENOUS
  Filled 2024-04-27: qty 250

## 2024-04-27 MED ORDER — SODIUM CHLORIDE 0.9 % IV BOLUS
1000.0000 mL | Freq: Once | INTRAVENOUS | Status: AC
  Administered 2024-04-27: 1000 mL via INTRAVENOUS

## 2024-04-27 MED ORDER — ACETAMINOPHEN 325 MG PO TABS: 650.0000 mg | ORAL_TABLET | ORAL | Status: DC | PRN

## 2024-04-27 MED ORDER — MILRINONE LACTATE IN DEXTROSE 20-5 MG/100ML-% IV SOLN
0.1250 ug/kg/min | INTRAVENOUS | Status: DC
  Administered 2024-04-27 – 2024-04-29 (×4): 0.25 ug/kg/min via INTRAVENOUS
  Filled 2024-04-27 (×5): qty 100

## 2024-04-27 MED ORDER — METOPROLOL TARTRATE 25 MG PO TABS
25.0000 mg | ORAL_TABLET | Freq: Four times a day (QID) | ORAL | Status: DC
  Administered 2024-04-27: 25 mg via ORAL
  Filled 2024-04-27: qty 1

## 2024-04-27 MED ORDER — PROCHLORPERAZINE EDISYLATE 10 MG/2ML IJ SOLN
5.0000 mg | Freq: Once | INTRAMUSCULAR | Status: AC
  Administered 2024-04-27: 5 mg via INTRAVENOUS
  Filled 2024-04-27: qty 2

## 2024-04-27 MED ORDER — APIXABAN 5 MG PO TABS
5.0000 mg | ORAL_TABLET | Freq: Two times a day (BID) | ORAL | Status: DC
  Administered 2024-04-27 (×2): 5 mg via ORAL
  Filled 2024-04-27 (×2): qty 1

## 2024-04-27 MED ORDER — VANCOMYCIN HCL 2000 MG/400ML IV SOLN
2000.0000 mg | Freq: Once | INTRAVENOUS | Status: AC
  Administered 2024-04-27: 2000 mg via INTRAVENOUS
  Filled 2024-04-27: qty 400

## 2024-04-27 MED ORDER — SODIUM CHLORIDE 0.9% FLUSH
10.0000 mL | Freq: Two times a day (BID) | INTRAVENOUS | Status: DC
  Administered 2024-04-27 – 2024-05-03 (×10): 10 mL

## 2024-04-27 MED ORDER — NOREPINEPHRINE 4 MG/250ML-% IV SOLN
0.0000 ug/min | INTRAVENOUS | Status: DC
  Administered 2024-04-27: 12 ug/min via INTRAVENOUS
  Filled 2024-04-27: qty 250

## 2024-04-27 MED ORDER — SODIUM CHLORIDE 0.9 % IV SOLN
2.0000 g | Freq: Three times a day (TID) | INTRAVENOUS | Status: DC
  Administered 2024-04-27 – 2024-04-28 (×2): 2 g via INTRAVENOUS
  Filled 2024-04-27 (×2): qty 12.5

## 2024-04-27 MED ORDER — POTASSIUM CHLORIDE CRYS ER 20 MEQ PO TBCR
40.0000 meq | EXTENDED_RELEASE_TABLET | Freq: Once | ORAL | Status: AC
  Administered 2024-04-27: 40 meq via ORAL
  Filled 2024-04-27: qty 2

## 2024-04-27 NOTE — Discharge Instructions (Signed)

## 2024-04-27 NOTE — Telephone Encounter (Signed)
 Patient Product/process development scientist completed.    The patient is insured through [facility]. Patient has [employer], may use a copay card, and/or apply for patient assistance if available.    Ran test claim for Eliquis  5 mg and the current 30 day co-pay is $70.00.   This test claim was processed through [facility]- copay amounts may vary at other pharmacies due to pharmacy/plan contracts, or as the patient moves through the different stages of their insurance plan.     Morgan Arab, CPHT Pharmacy Technician III Certified Patient Advocate [facility] Patient Advocate Team Direct Number: [phone]  Fax: [phone]

## 2024-04-27 NOTE — Progress Notes (Signed)
 PROGRESS NOTE  Jason Fleming ZOX:[id number] DOB: 1961-10-06 DOA: 04/26/2024 PCP: Austine Lefort, MD  Brief History:  63 year old male with a history of anxiety/depression, hypertension paroxysmal atrial fibrillation, coronary artery disease, hyperlipidemia, dilated aortic root presenting with dyspnea on exertion for the better part of 2 months.  Patient had had 4-day history of suprapubic and left lower quadrant abdominal pain about 2 weeks ago.  He had an appointment to see his primary care provider for this.  His abdominal pain had resolved, but he was still having dyspnea on exertion for which he kept his appointment. During his visit with his outpatient provider on the morning of 04/26/2024, the patient was noted to have atrial fibrillation with RVR on EKG with heart rate in the 160s.  As result, the patient was sent to the emergency department for further evaluation and treatment. The patient denies any frank chest discomfort, coughing, hemoptysis, dizziness, syncope.    Notably, the patient states that he had a  "stomach bug" this past weekend with nausea, vomiting, and diarrhea.  He stated that it only lasted 24 hours and this has resolved.  He had fevers up to 100.8 F.  He states that this has improved as well.  There is no hematochezia, melena, dysuria, hematuria. He denies any new medications.  Regarding his atrial fibrillation, the patient had an episode of atrial fibrillation while he was visiting the ED July 2019.  He was cardioverted at that time and started on apixaban .  He remained in sinus rhythm and was subsequently taken off of apixaban  after a month.  He has not had recurrence of his atrial fibrillation since that time.  In the ED the patient was afebrile with stable oxygen saturation 97% room air.  Heart rate initially was in the 160s.  He started on diltiazem  drip with heart rate now in the 110s.  WBC 6.9, hemoglobin 12.5, platelets 183.  Sodium 139, open 24, serum  creatinine 0.94.  Magnesium 2.1.  BNP 367.  Troponin 14.  UA negative for pyuria.  UDS negative.  EKG showed atrial fibrillation with RVR and nonspecific T wave changes.  Patient was started on diltiazem  drip.  He was admitted for further evaluation and treatment.   Assessment/Plan: Paroxysmal atrial fibrillation with RVR - Diltiazem  drip>>transition to po metoprolol  - Echo - TSH 1.558 - CHADSVASC2 = 2 (HTN, CHF) - initially on IV heparin>>po apixaban   Acute CHF, type unspecified -work up in progress -IV lasix started -Echo -accurate I/O -daily weight -appreciate cardiology consult   Essential hypertension - Discontinue amlodipine    Coronary artery disease -no chest pain -started [facility] in lieu of ASA -CT calcium  score revealed coronary calcium  score of 479 (89th percentile) with the bulk of plaque in LAD alone    Mixed hyperlipidemia -Continue statin   Obesity -BMI 32.23 -Lifestyle modification  Hypokalemia -replete      Family Communication: no  Family at bedside  Consultants:  cardiology  Code Status:  FULL   DVT Prophylaxis:  IV Heparin>>>apixaban    Procedures: As Listed in Progress Note Above  Antibiotics: None       Subjective: Patient denies fevers, chills, headache, chest pain, dyspnea, nausea, vomiting, diarrhea, abdominal pain, dysuria, hematuria, hematochezia, and melena.   Objective: Vitals:   04/27/24 0800 04/27/24 0900 04/27/24 1000 04/27/24 1020  BP: 108/72 103/78 104/75 104/75  Pulse: (!) 52 72 75 83  Resp: 17 13 19    Temp:  TempSrc:      SpO2: 95% 92% 98%   Weight:      Height:        Intake/Output Summary (Last 24 hours) at 04/27/2024 1117 Last data filed at 04/27/2024 1102 Gross per 24 hour  Intake 911.03 ml  Output 1525 ml  Net -613.97 ml   Weight change:  Exam:  General:  Pt is alert, follows commands appropriately, not in acute distress HEENT: No icterus, No thrush, No neck mass, [place]/AT Cardiovascular: RRR,  S1/S2, no rubs, no gallops Respiratory: bibasilar crackles.  No wheeze Abdomen: Soft/+BS, non tender, non distended, no guarding Extremities: trace LE edema, No lymphangitis, No petechiae, No rashes, no synovitis   Data Reviewed: I have personally reviewed following labs and imaging studies Basic Metabolic Panel: Recent Labs  Lab 04/26/24 1157 04/27/24 0504  NA 139 136  K 3.9 3.4*  CL 104 104  CO2 24 24  GLUCOSE 90 101*  BUN 11 13  CREATININE 0.94 0.86  CALCIUM  8.7* 8.9  MG 2.1 2.3   Liver Function Tests: No results for input(s): "AST", "ALT", "ALKPHOS", "BILITOT", "PROT", "ALBUMIN" in the last 168 hours. No results for input(s): "LIPASE", "AMYLASE" in the last 168 hours. No results for input(s): "AMMONIA" in the last 168 hours. Coagulation Profile: No results for input(s): "INR", "PROTIME" in the last 168 hours. CBC: Recent Labs  Lab 04/26/24 1157 04/27/24 0504  WBC 6.9 7.6  HGB 12.5* 12.9*  HCT 40.4 40.4  MCV 89.6 87.6  PLT 183 168   Cardiac Enzymes: No results for input(s): "CKTOTAL", "CKMB", "CKMBINDEX", "TROPONINI" in the last 168 hours. BNP: Invalid input(s): "POCBNP" CBG: No results for input(s): "GLUCAP" in the last 168 hours. HbA1C: No results for input(s): "HGBA1C" in the last 72 hours. Urine analysis:    Component Value Date/Time   COLORURINE YELLOW 04/26/2024 1311   APPEARANCEUR CLEAR 04/26/2024 1311   LABSPEC 1.016 04/26/2024 1311   PHURINE 6.0 04/26/2024 1311   GLUCOSEU NEGATIVE 04/26/2024 1311   HGBUR NEGATIVE 04/26/2024 1311   BILIRUBINUR NEGATIVE 04/26/2024 1311   KETONESUR NEGATIVE 04/26/2024 1311   PROTEINUR 100 (A) 04/26/2024 1311   NITRITE NEGATIVE 04/26/2024 1311   LEUKOCYTESUR NEGATIVE 04/26/2024 1311   Sepsis Labs: @LABRCNTIP (procalcitonin:4,lacticidven:4) ) Recent Results (from the past 240 hours)  MRSA Next Gen by PCR, Nasal     Status: None   Collection Time: 04/26/24  3:31 PM   Specimen: Nasal Mucosa; Nasal Swab  Result  Value Ref Range Status   MRSA by PCR Next Gen NOT DETECTED NOT DETECTED Final    Comment: (NOTE) The GeneXpert MRSA Assay (FDA approved for NASAL specimens only), is one component of a comprehensive MRSA colonization surveillance program. It is not intended to diagnose MRSA infection nor to guide or monitor treatment for MRSA infections. Test performance is not FDA approved in patients less than 17 years old. Performed at [facility], [address], [place], [place] [address]      Scheduled Meds:  apixaban   5 mg Oral BID   Chlorhexidine  Gluconate Cloth  6 each Topical Q0600   metoprolol  tartrate  25 mg Oral QID   rosuvastatin   20 mg Oral Daily   venlafaxine  XR  150 mg Oral Q breakfast   Continuous Infusions:  diltiazem  (CARDIZEM ) infusion 10 mg/hr (04/27/24 1102)    Procedures/Studies: DG Chest Portable 1 View Result Date: 04/26/2024 CLINICAL DATA:  Shortness of breath EXAM: PORTABLE CHEST 1 VIEW COMPARISON:  None Available. FINDINGS: The heart size and  mediastinal contours are within normal limits. Both lungs are clear. The visualized skeletal structures are unremarkable. IMPRESSION: No active disease. Electronically Signed   By: Fredrich Jefferson M.D.   On: 04/26/2024 15:47    Demaris Fillers, DO  [facility]  If 7PM-7AM, please contact night-coverage [email or web address] Password [email or web address] 04/27/2024, 11:17 AM   LOS: 0 days

## 2024-04-27 NOTE — Progress Notes (Addendum)
 Hypotension this afternoon SBP to 60s. Diltiazem  had been weaned off, was started on lopressor  25mg  qid with hold parameters, and received 20mg  of IV lasix. Odd that he had been on dilt 10-15 per hour nearly 24 hours, and became hypotensive with weaning. Fairly low dose lopressor  and lasix were dosed. Bolused total of 1.5 L so far, remained hypotensive and peripheral levophed started. Labs ordered including cbc since he has been on anticoagulation, bmet, lactic acid, procalc, cultures. Also wrote for calcium  gluconate for reversal of CCB/Beta blocker.   EKG afib, no acute ischemic changes Echo done, limited views as patient refused full study. LVEF approx 30%, RV limited visualization but roughly moderately reduced. May be cardiogenic shock precipitated by uncontrolled afib along with av nodal agents diltiazem  and metoprolol . Have recommended central line placement, titrate levophed. Check coox once line placed.    Armida Lander MD

## 2024-04-27 NOTE — Progress Notes (Signed)
 Progress Note  [facility] was called and I spoke with PCCM (Dr. Charon Copper) regarding transfer of patient to ICU at [facility]. Heart Failure team (Dr Elease Grice) was already made aware by Dr. Amanda Jungling (Cardiologist here).

## 2024-04-27 NOTE — Consult Note (Signed)
 Cardiology Consult:   Patient ID: Jason Fleming MRN: [id number]; DOB: 01-30-1961   Admission date: 04/26/2024  Primary Care Provider: Austine Lefort, MD Primary Cardiologist: Maudine Sos, MD  Primary Electrophysiologist:  None   Chief Complaint:  Cardiogenic Shock   Patient Profile:   Jason Fleming is a 63 y.o. male with Afib, HTN,   History of Present Illness:   Jason Fleming presented to his PCP on 04/26/2024 with shortness of breath. Upon arrival he was found to be in afib with RVR with rates of 160s. He was sent to the ED for further evaluation. On arrival to the ED, his blood pressure was 119/66 with a HR in he 160s. His CXR without overt pulmonary edema. Labs with low and flt troponins. BNP 367. He was started on diltiazem  gtt.  Later his diltiazem  was weaned and he was started on metoprolol  25 mg and lasix 20 mg. He became hypotensive with a systolic pressure of 60 mmHg. He was given 1.5 L bolus and was started on levo and milrinone. He was also given calcium  gluconate for beta-blocker reversal. Limited Echo obtained with LVEF ~30% and moderately reduced RV function. He was transferred to [facility] for continued care.    Past Medical History:  Diagnosis Date   Anxiety    Atrial fibrillation (HCC)    Depression    Hypertension    Paroxysmal atrial fibrillation (HCC)    cardioversion in ed 05/2018    Past Surgical History:  Procedure Laterality Date   CARPAL TUNNEL RELEASE     COLONOSCOPY  2013   Dr.Hung-normal exam   MASS EXCISION  05/26/2012   Procedure: MINOR EXCISION OF MASS;  Surgeon: Amelie Baize., MD;  Location: [facility];  Service: Orthopedics;  Laterality: Left;   TONSILLECTOMY     ULNAR NERVE REPAIR Bilateral    WISDOM TOOTH EXTRACTION       Medications Prior to Admission: Prior to Admission medications   Medication Sig Start Date End Date Taking? Authorizing Provider  amLODipine  (NORVASC ) 5 MG tablet TAKE 1 TABLET BY MOUTH DAILY 06/18/23   Yes Austine Lefort, MD  aspirin  EC 81 MG tablet Take 1 tablet (81 mg total) by mouth daily. Swallow whole. 07/29/23  Yes Austine Lefort, MD  Multiple Vitamin (MULTIVITAMIN) tablet Take 1 tablet by mouth daily.   Yes [provider]  Omega-3 Fatty Acids (FISH OIL PO) Take 1 capsule by mouth daily.   Yes [provider]  rosuvastatin  (CRESTOR ) 20 MG tablet Take 1 tablet (20 mg total) by mouth daily. 07/29/23  Yes Austine Lefort, MD  venlafaxine  XR (EFFEXOR -XR) 150 MG 24 hr capsule Take 1 capsule (150 mg total) by mouth daily with breakfast. 06/23/23  Yes Austine Lefort, MD     Allergies:   No Known Allergies  Social History:   Social History   Socioeconomic History   Marital status: Married    Spouse name: Not on file   Number of children: Not on file   Years of education: Not on file   Highest education level: 12th grade  Occupational History   Not on file  Tobacco Use   Smoking status: Former    Types: E-cigarettes    Quit date: 03/02/2012    Years since quitting: 12.1   Smokeless tobacco: Never  Vaping Use   Vaping status: Former  Substance and Sexual Activity   Alcohol use: Not Currently    Alcohol/week: 3.0 standard drinks  of alcohol    Types: 3 Standard drinks or equivalent per week   Drug use: No   Sexual activity: Yes  Other Topics Concern   Not on file  Social History Narrative   Not on file   Social Drivers of Health   Financial Resource Strain: Low Risk  (04/25/2024)   Overall Financial Resource Strain (CARDIA)    Difficulty of Paying Living Expenses: Not hard at all  Food Insecurity: No Food Insecurity (04/26/2024)   Hunger Vital Sign    Worried About Running Out of Food in the Last Year: Never true    Ran Out of Food in the Last Year: Never true  Transportation Needs: No Transportation Needs (04/26/2024)   PRAPARE - Administrator, Civil Service (Medical): No    Lack of Transportation (Non-Medical): No  Physical Activity:  Inactive (04/25/2024)   Exercise Vital Sign    Days of Exercise per Week: 0 days    Minutes of Exercise per Session: 0 min  Stress: Stress Concern Present (04/25/2024)   Jason Fleming [employer] Occupational [employer] - Occupational Stress Questionnaire    Feeling of Stress : Rather much  Social Connections: Moderately Integrated (04/25/2024)   Social Connection and Isolation Panel [NHANES]    Frequency of Communication with Friends and Family: More than three times a week    Frequency of Social Gatherings with Friends and Family: Twice a week    Attends Religious Services: More than 4 times per year    Active Member of [employer] or Organizations: No    Attends Banker Meetings: Never    Marital Status: Married  Catering manager Violence: Not At Risk (04/26/2024)   Humiliation, Afraid, Rape, and Kick questionnaire    Fear of Current or Ex-Partner: No    Emotionally Abused: No    Physically Abused: No    Sexually Abused: No    Family History:   The patient's family history includes Alcohol abuse in his maternal uncle and paternal uncle; Anxiety disorder in his mother; CAD in his father; Depression in his father; Heart disease in his father; Hypertension in his mother; Stroke in his maternal grandfather. There is no history of Colon cancer, Colon polyps, Esophageal cancer, Stomach cancer, or Rectal cancer.    Review of Systems: [y] = yes, [ ]  = no    General: Weight gain [ ] ; Weight loss [ ] ; Anorexia [ ] ; Fatigue [ ] ; Fever [ ] ; Chills [ ] ; Weakness [ ]   Cardiac: Chest pain/pressure [ ] ; Resting SOB [ ] ; Exertional SOB [ ] ; Orthopnea [ ] ; Pedal Edema [ ] ; Palpitations [ ] ; Syncope [ ] ; Presyncope [ ] ; Paroxysmal nocturnal dyspnea[ ]   Pulmonary: Cough [ ] ; Wheezing[ ] ; Hemoptysis[ ] ; Sputum [ ] ; Snoring [ ]   GI: Vomiting[ ] ; Dysphagia[ ] ; Melena[ ] ; Hematochezia [ ] ; Heartburn[ ] ; Abdominal pain [ ] ; Constipation [ ] ; Diarrhea [ ] ; BRBPR [ ]   GU: Hematuria[ ] ; Dysuria [ ] ; Nocturia[ ]    Vascular: Pain in legs with walking [ ] ; Pain in feet with lying flat [ ] ; Non-healing sores [ ] ; Stroke [ ] ; TIA [ ] ; Slurred speech [ ] ;  Neuro: Headaches[ ] ; Vertigo[ ] ; Seizures[ ] ; Paresthesias[ ] ;Blurred vision [ ] ; Diplopia [ ] ; Vision changes [ ]   Ortho/Skin: Arthritis [ ] ; Joint pain [ ] ; Muscle pain [ ] ; Joint swelling [ ] ; Back Pain [ ] ; Rash [ ]   Psych: Depression[ ] ; Anxiety[ ]   Heme: Bleeding problems [ ] ;  Clotting disorders [ ] ; Anemia [ ]   Endocrine: Diabetes [ ] ; Thyroid  dysfunction[ ]   Physical Exam/Data:   Vitals:   04/27/24 2045 04/27/24 2100 04/27/24 2200 04/27/24 2300  BP: 91/71 (!) 120/90  134/81  Pulse: (!) 50 68 (!) 118 81  Resp: 18 19 17  (!) 21  Temp:  98.4 F (36.9 C)    TempSrc:  Oral    SpO2: (!) 88% 95% 91% (!) 86%  Weight:      Height:        Intake/Output Summary (Last 24 hours) at 04/27/2024 2334 Last data filed at 04/27/2024 2247 Gross per 24 hour  Intake 3615.62 ml  Output 625 ml  Net 2990.62 ml   Filed Weights   04/26/24 1135 04/26/24 1528  Weight: 113.9 kg 114.8 kg   Body mass index is 32.49 kg/m.  General:  Well nourished, well developed, in no acute distress HEENT: normal Lymph: no adenopathy Neck: unable to appreciate JVD Endocrine:  No thryomegaly Vascular: No carotid bruits; FA pulses 2+ bilaterally without bruits  Cardiac:  irregular rate and rhythm  Lungs:  clear to auscultation bilaterally, no wheezing, rhonchi or rales  Abd: soft, nontender, no hepatomegaly  Ext: no peripheral edema Musculoskeletal:  No deformities, BUE and BLE strength normal and equal Skin: warm and dry  Psych:  Normal affect    EKG:   EKG 04/26/2024 11:40: Afib with RVR   Laboratory Data:  Chemistry Recent Labs  Lab 04/27/24 0504 04/27/24 1509  NA 136 134*  K 3.4* 4.0  CL 104 106  CO2 24 18*  GLUCOSE 101* 139*  BUN 13 14  CREATININE 0.86 1.39*  CALCIUM  8.9 8.2*  GFRNONAA >60 57*  ANIONGAP 8 10    No results for input(s): "PROT",  "ALBUMIN", "AST", "ALT", "ALKPHOS", "BILITOT" in the last 168 hours. Hematology Recent Labs  Lab 04/27/24 0504 04/27/24 1509  WBC 7.6 7.2  RBC 4.61 4.43  HGB 12.9* 12.2*  HCT 40.4 40.2  MCV 87.6 90.7  MCH 28.0 27.5  MCHC 31.9 30.3  RDW 14.7 14.7  PLT 168 176   Cardiac EnzymesNo results for input(s): "TROPONINI" in the last 168 hours. No results for input(s): "TROPIPOC" in the last 168 hours.  BNP Recent Labs  Lab 04/26/24 1157  BNP 367.0*    DDimer No results for input(s): "DDIMER" in the last 168 hours.  Radiology/Studies:  DG CHEST PORT 1 VIEW Result Date: 04/27/2024 CLINICAL DATA:  PICC line placement. EXAM: PORTABLE CHEST 1 VIEW COMPARISON:  Radiographs 04/26/2024 and 06/09/2018. Cardiac CT 07/18/2023. FINDINGS: 1753 hours. There is a faintly visible right arm PICC which projects to the mid SVC level. No evidence of pneumothorax or significant pleural effusion. The lungs remain clear. Stable borderline cardiac enlargement. No acute osseous findings are seen. IMPRESSION: Right arm PICC projects to the mid SVC level. No evidence of pneumothorax or other acute cardiopulmonary process. Electronically Signed   By: Elmon Hagedorn M.D.   On: 04/27/2024 18:21   ECHOCARDIOGRAM COMPLETE Result Date: 04/27/2024    ECHOCARDIOGRAM REPORT   Patient Name:   Jason Fleming Date of Exam: 04/27/2024 Medical Rec #:  [id number]      Height:       74.0 in Accession #:    [id number]     Weight:       253.1 lb Date of Birth:  06/11/1961      BSA:          2.402 m Patient Age:  63 years       BP:           90/71 mmHg Patient Gender: M              HR:           67 bpm. Exam Location:  [facility] Procedure: 2D Echo, Cardiac Doppler and Color Doppler (Both Spectral and Color            Flow Doppler were utilized during procedure). Indications:    A-fib  History:        Patient has prior history of Echocardiogram examinations, most                 recent 08/11/2018. Arrythmias:Atrial Fibrillation; Risk                  Factors:Hypertension.  Sonographer:    Jeralene Mom Referring Phys: [phone] DAVID TAT IMPRESSIONS  1. Left ventricular ejection fraction, by estimation, is 25 to 30%. The left ventricle has moderately decreased function. Left ventricular endocardial border not optimally defined to evaluate regional wall motion. Left ventricular diastolic parameters are indeterminate.  2. RV not well visualized. Appears enlarged with roughly moderate systolic dysfunction. Indeterminate PASP, IVC not visualized. . Right ventricular systolic function was not well visualized. The right ventricular size is not well visualized.  3. The mitral valve is abnormal. Moderate mitral valve regurgitation. No evidence of mitral stenosis.  4. The aortic valve is tricuspid. Aortic valve regurgitation is not visualized. No aortic stenosis is present.  5. Limited study, patient uncomfortable with positioning and asked to end study. FINDINGS  Left Ventricle: Left ventricular ejection fraction, by estimation, is 25 to 30%. The left ventricle has moderately decreased function. Left ventricular endocardial border not optimally defined to evaluate regional wall motion. The left ventricular internal cavity size was normal in size. There is no left ventricular hypertrophy. Left ventricular diastolic parameters are indeterminate. Right Ventricle: RV not well visualized. Appears enlarged with roughly moderate systolic dysfunction. Indeterminate PASP, IVC not visualized. The right ventricular size is not well visualized. Right vetricular wall thickness was not well visualized. Right ventricular systolic function was not well visualized. Left Atrium: Left atrial size was normal in size. Right Atrium: Right atrial size was not well visualized. Pericardium: There is no evidence of pericardial effusion. Mitral Valve: The mitral valve is abnormal. Moderate mitral valve regurgitation. No evidence of mitral valve stenosis. MV peak gradient, 3.6 mmHg. The mean  mitral valve gradient is 1.0 mmHg. Tricuspid Valve: The tricuspid valve is not well visualized. Tricuspid valve regurgitation is mild . No evidence of tricuspid stenosis. Aortic Valve: The aortic valve is tricuspid. Aortic valve regurgitation is not visualized. No aortic stenosis is present. Aortic valve mean gradient measures 1.0 mmHg. Aortic valve peak gradient measures 2.7 mmHg. Aortic valve area, by VTI measures 2.50 cm. Pulmonic Valve: The pulmonic valve was not well visualized. Pulmonic valve regurgitation is not visualized. No evidence of pulmonic stenosis. Aorta: The aortic root and ascending aorta are structurally normal, with no evidence of dilitation.  LEFT VENTRICLE PLAX 2D LVIDd:         5.85 cm      Diastology LVIDs:         4.60 cm      LV e' medial:    12.60 cm/s LV PW:         1.00 cm      LV E/e' medial:  7.3 LV IVS:  1.00 cm      LV e' lateral:   15.60 cm/s LVOT diam:     2.10 cm      LV E/e' lateral: 5.9 LV SV:         39 LV SV Index:   16 LVOT Area:     3.46 cm  LV Volumes (MOD) LV vol d, MOD A4C: 115.0 ml LV vol s, MOD A4C: 58.2 ml LV SV MOD A4C:     115.0 ml RIGHT VENTRICLE RV Basal diam:  4.40 cm RV Mid diam:    3.20 cm RV S prime:     9.03 cm/s LEFT ATRIUM           Index        RIGHT ATRIUM           Index LA diam:      4.60 cm 1.91 cm/m   RA Area:     35.60 cm LA Vol (A4C): 61.7 ml 25.68 ml/m  RA Volume:   134.00 ml 55.78 ml/m  AORTIC VALVE AV Area (Vmax):    2.28 cm AV Area (Vmean):   2.16 cm AV Area (VTI):     2.50 cm AV Vmax:           81.40 cm/s AV Vmean:          54.300 cm/s AV VTI:            0.156 m AV Peak Grad:      2.7 mmHg AV Mean Grad:      1.0 mmHg LVOT Vmax:         53.55 cm/s LVOT Vmean:        33.850 cm/s LVOT VTI:          0.112 m LVOT/AV VTI ratio: 0.72  AORTA Ao Root diam: 3.40 cm Ao Asc diam:  3.40 cm MITRAL VALVE               TRICUSPID VALVE MV Area (PHT): 5.02 cm    TR Peak grad:   12.8 mmHg MV Area VTI:   1.30 cm    TR Vmax:        179.00 cm/s MV  Peak grad:  3.6 mmHg MV Mean grad:  1.0 mmHg    SHUNTS MV Vmax:       0.95 m/s    Systemic VTI:  0.11 m MV Vmean:      50.1 cm/s   Systemic Diam: 2.10 cm MV Decel Time: 151 msec MR Peak grad: 51.3 mmHg MR Vmax:      358.00 cm/s MV E velocity: 91.70 cm/s MV A velocity: 40.30 cm/s MV E/A ratio:  2.28 Armida Lander MD Electronically signed by Armida Lander MD Signature Date/Time: 04/27/2024/4:19:51 PM    Final    US  EKG SITE RITE Result Date: 04/27/2024 If Site Rite image not attached, placement could not be confirmed due to current cardiac rhythm.   Assessment and Plan:  Mr. Petit is a 63 year old with a history of pAfib who presented to the ED in afib and later went into cardiogenic shock.   Cardiogenic Shock Cardiogenic shock in the setting of afib with RVR and AV nodal blockage. Initial mixed venous was 26% with lactate of 3.6. Upon arrival to the ICU he is on levo 6 and milrinone 0.25. His MAP is 91 and his peripheral extremities are warm. Shock appears to be improving. We will transduce CVP off PICC (difficult to appreciate fluid status) and obtain  Coox - Transduce CVP; +/- additional lasix  - Coox with lactate  - ECHO - Continue milrinone and levo for now until lactate and coox result. He may benefit from afterload reduction with the milrinone given his MAPs are currently in the 90s. Can consider beginning to wean levo   Atrial fibrillation with RVR: now rate controlled.  - CHADVAsc previousl y1, but now 2 in the setting of new onset HF  - Hold off on rate control given resolving shock.  - Can consider TEE/DCCV once more euvolemic  New Onset Systolic Heart Failure  - Will need an ischemic evaluation at some point; CAC of 479 wiith all the disease in the LAD.  - Formal ECHO in the AM  - GDMT to begin after shock resolves   Hypertension  - Hold amlodipine   Dyslipidemia  - Continue rosuvastatin    Severity of Illness: The appropriate patient status for this patient is INPATIENT.  Inpatient status is judged to be reasonable and necessary in order to provide the required intensity of service to ensure the patient's safety. The patient's presenting symptoms, physical exam findings, and initial radiographic and laboratory data in the context of their chronic comorbidities is felt to place them at high risk for further clinical deterioration. Furthermore, it is not anticipated that the patient will be medically stable for discharge from the hospital within 2 midnights of admission.   * I certify that at the point of admission it is my clinical judgment that the patient will require inpatient hospital care spanning beyond 2 midnights from the point of admission due to high intensity of service, high risk for further deterioration and high frequency of surveillance required.*   For questions or updates, please contact [facility] Please consult [email or web address] for contact info under        Signed, Renelda Carry, MD  04/27/2024 11:34 PM

## 2024-04-27 NOTE — Progress Notes (Addendum)
 PICC line placed, patient on levophed at 10 MAPs in 70s. COOX 26%, initiating milrinone at 0.25. Discussed with Dr Tat who will work with nocturnist to arrange transfer to critical care at [facility]. Spoke with Dr Elease Grice from HF team at [facility] who is aware of patient and team will help manage at [facility]. Suspect low EF possibly tachymediated, sudden decompensation this morning from av nodal/negative inotropic agents he was receiving for his afib with RVR.    Armida Lander MD

## 2024-04-27 NOTE — Progress Notes (Signed)
 Peripherally Inserted Central Catheter Placement  The IV Nurse has discussed with the patient and/or persons authorized to consent for the patient, the purpose of this procedure and the potential benefits and risks involved with this procedure.  The benefits include less needle sticks, lab draws from the catheter, and the patient may be discharged home with the catheter. Risks include, but not limited to, infection, bleeding, blood clot (thrombus formation), and puncture of an artery; nerve damage and irregular heartbeat and possibility to perform a PICC exchange if needed/ordered by physician.  Alternatives to this procedure were also discussed.  Bard Power PICC patient education guide, fact sheet on infection prevention and patient information card has been provided to patient /or left at bedside.    PICC Placement Documentation  PICC Double Lumen 04/27/24 Right Brachial 47 cm 3 cm (Active)  Indication for Insertion or Continuance of Line Vasoactive infusions 04/27/24 1750  Exposed Catheter (cm) 3 cm 04/27/24 1750  Site Assessment Clean, Dry, Intact 04/27/24 1750  Lumen #1 Status Flushed;Saline locked;Blood return noted 04/27/24 1750  Lumen #2 Status Flushed;Blood return noted;Saline locked 04/27/24 1750  Dressing Type Transparent 04/27/24 1750  Dressing Status Antimicrobial disc/dressing in place;Clean, Dry, Intact 04/27/24 1750  Line Care Lumen 1 cap changed;Lumen 2 cap changed 04/27/24 1750  Dressing Change Due 05/04/24 04/27/24 1750       Jason Fleming 04/27/2024, 6:12 PM

## 2024-04-27 NOTE — Progress Notes (Signed)
 Cross covering ICU physician   Called directly by Dr Adefeso for ICU bed. After discussion with him and reading Dr Amanda Jungling note, who had notified HF and recommended transfer for milrinone and levo infusions. At this time CCM will not formally consult, however should cardiology/HF team need our assistance upon his arrival they can request formal consult at that time.

## 2024-04-27 NOTE — Plan of Care (Signed)

## 2024-04-27 NOTE — Progress Notes (Signed)
 PHARMACY - ANTICOAGULATION CONSULT NOTE  Pharmacy Consult for heparin Indication: atrial fibrillation  No Known Allergies  Patient Measurements: Height: 6\' 2"  (188 cm) Weight: 114.8 kg (253 lb 1.4 oz) IBW/kg (Calculated) : 82.2 HEPARIN DW (KG): 106.4  Vital Signs: Temp: 97.5 F (36.4 C) (06/03 0416) Temp Source: Oral (06/03 0416) BP: 105/82 (06/03 0700) Pulse Rate: 63 (06/03 0700)  Labs: Recent Labs    04/26/24 1157 04/26/24 1424 04/26/24 2204 04/27/24 0504 04/27/24 0758  HGB 12.5*  --   --  12.9*  --   HCT 40.4  --   --  40.4  --   PLT 183  --   --  168  --   HEPARINUNFRC  --   --  0.20*  --  0.34  CREATININE 0.94  --   --  0.86  --   TROPONINIHS 14 12  --   --   --     Estimated Creatinine Clearance: 119.9 mL/min (by C-G formula based on SCr of 0.86 mg/dL).   Medical History: Past Medical History:  Diagnosis Date   Anxiety    Atrial fibrillation (HCC)    Depression    Hypertension    Paroxysmal atrial fibrillation (HCC)    cardioversion in ed 05/2018    Medications:  Medications Prior to Admission  Medication Sig Dispense Refill Last Dose/Taking   amLODipine  (NORVASC ) 5 MG tablet TAKE 1 TABLET BY MOUTH DAILY 90 tablet 3    aspirin  EC 81 MG tablet Take 1 tablet (81 mg total) by mouth daily. Swallow whole.      Multiple Vitamin (MULTIVITAMIN) tablet Take 1 tablet by mouth daily.      Omega-3 Fatty Acids (FISH OIL PO) Take by mouth.      rosuvastatin  (CRESTOR ) 20 MG tablet Take 1 tablet (20 mg total) by mouth daily. 90 tablet 3    venlafaxine  XR (EFFEXOR -XR) 150 MG 24 hr capsule Take 1 capsule (150 mg total) by mouth daily with breakfast. 30 capsule 0     Assessment: Pharmacy consulted to dose heparin in patient with atrial fibrillation.  Patient is not on anticoagulation prior to admission.  CBC WNL  HL 0.20> 0.34, No signs of bleeding / pauses in gtt reported per RN  Goal of Therapy:  Heparin level 0.3-0.7 units/ml Monitor platelets by  anticoagulation protocol: Yes   Plan:  Continue heparin infusion at 1700 units/hr Check anti-Xa level in 8 hours and daily while on heparin Continue to monitor H&H and platelets  Lora Robinsons, BS Pharm D, BCPS Clinical Pharmacist 04/27/2024 8:40 AM

## 2024-04-27 NOTE — Progress Notes (Signed)
 Pharmacy Antibiotic Note  Jason Fleming is a 63 y.o. male admitted on 04/26/2024 with sepsis.  Pharmacy has been consulted for vancomycin and cefepime dosing.  Plan: Vancomycin 2000mg  IV loading dose, then 1750 mg IV Q 24 hrs. Goal AUC 400-550. Expected AUC: 529 SCr used: 1.39 Cefepime 2gm IV q8h F/U cxs and clinical progress Monitor V/S, labs and levels as indicated   Height: 6\' 2"  (188 cm) Weight: 114.8 kg (253 lb 1.4 oz) IBW/kg (Calculated) : 82.2  Temp (24hrs), Avg:97.9 F (36.6 C), Min:97.5 F (36.4 C), Max:98.5 F (36.9 C)  Recent Labs  Lab 04/26/24 1157 04/27/24 0504 04/27/24 1509  WBC 6.9 7.6 7.2  CREATININE 0.94 0.86 1.39*  LATICACIDVEN  --   --  3.4*    Estimated Creatinine Clearance: 74.2 mL/min (A) (by C-G formula based on SCr of 1.39 mg/dL (H)).    No Known Allergies  Antimicrobials this admission: cefepime 6/3 >>  Vancomycin  6/3 >>   Microbiology results: 6/3 BCx: pending  MRSA PCR:   Thank you for allowing pharmacy to be a part of this patient's care.  Chenell Lozon, BS Pharm D, BCPS Clinical Pharmacist 04/27/2024 4:15 PM

## 2024-04-27 NOTE — Progress Notes (Signed)
 Date and time results received: 04/27/24 1542 (use smartphrase ".now" to insert current time)  Test: Lactic Acid  Critical Value: 3.4  Name of Provider Notified: Dr Tat and Dr Amanda Jungling  Orders Received? Or Actions Taken?: Awaiting new orders

## 2024-04-27 NOTE — Progress Notes (Signed)
 Responded to nursing call:   hypotension  Subjective: Pt denies cp, sob.  Has nausea and diaphoresis.  Vitals:   04/27/24 1715 04/27/24 1717 04/27/24 1719 04/27/24 1730  BP: 94/72   90/69  Pulse:  (!) 58 63 66  Resp: 19 15 14 19   Temp:      TempSrc:      SpO2: 97% 100% 98% 97%  Weight:      Height:       CV--IRRR Lung--bibasilar crackles Abd--soft+BS/NT Ext--trace LE edema   Assessment/Plan: Hypotension -most likely due to cardiogenic shock -low BPs after lopressor , lasix -metoprolol , dilt stopped -discussed with Dr. Deedee Farmer IVF boluses x 2--checking coox per Dr. Amanda Jungling -6/3 Echo--EF 25-30 -continue levophed -blood cultures PCT 0.11 -start empiric abx -PICC placed 6/3 -repeat Hgb stable, WBC 7.2 -elevated lactic due to cardiogenic shock    The patient is critically ill with multiple organ systems failure and requires high complexity decision making for assessment and support, frequent evaluation and titration of therapies, application of advanced monitoring technologies and extensive interpretation of multiple databases.  Critical care time - 35 mins.     Demaris Fillers, DO [facility]

## 2024-04-27 NOTE — Progress Notes (Signed)
*  PRELIMINARY RESULTS* Echocardiogram 2D Echocardiogram has been performed.  Glenna Lango 04/27/2024, 4:09 PM

## 2024-04-27 NOTE — Plan of Care (Signed)

## 2024-04-27 NOTE — Consult Note (Signed)
 Cardiology Consultation   Patient ID: MOHANNAD OLIVERO MRN: [id number]; DOB: January 04, 1961  Admit date: 04/26/2024 Date of Consult: 04/27/2024  PCP:  Austine Lefort, MD   [facility] Providers Cardiologist:  Maudine Sos, MD  Cardiology APP:  Gerldine Koch, NP       Patient Profile: TION TSE is a 63 y.o. male with a hx of HTN, HLD, isolated afib with DCCV 05/2018 low CHADS2Vasc score not continued on anticoag, elevated coronary Ca score who is being seen 04/27/2024 for the evaluation of afib at the request of Dr Tat.  History of Present Illness: Mr. Stave 63 yo male history of HTN, HLD, isolated afib with DCCV 05/2018 low CHADS2Vasc score not continued on anticoag, elevated coronary Ca score, presents with DOE x 2 months. Initially seen by pcp and noted to be in afib with RVR in the 160s, sent to ER for evaluation. Does report recent gastroenteritis symptoms, with N/V/D. Low grade fever at home 100.8.    K 3.9 Cr 0.94 BUN 11 Mg 2.1 WBC 6.9 Hgb 12.5 Plt 183 BNP 367 TSH 1.5 EKG afib 169 Trop 14-->12 CXR: no acute process Past Medical History:  Diagnosis Date   Anxiety    Atrial fibrillation (HCC)    Depression    Hypertension    Paroxysmal atrial fibrillation (HCC)    cardioversion in ed 05/2018    Past Surgical History:  Procedure Laterality Date   CARPAL TUNNEL RELEASE     COLONOSCOPY  2013   Dr.Hung-normal exam   MASS EXCISION  05/26/2012   Procedure: MINOR EXCISION OF MASS;  Surgeon: Amelie Baize., MD;  Location: [facility];  Service: Orthopedics;  Laterality: Left;   TONSILLECTOMY     ULNAR NERVE REPAIR Bilateral    WISDOM TOOTH EXTRACTION         Scheduled Meds:  Chlorhexidine  Gluconate Cloth  6 each Topical Q0600   rosuvastatin   20 mg Oral Daily   venlafaxine  XR  150 mg Oral Q breakfast   Continuous Infusions:  diltiazem  (CARDIZEM ) infusion 15 mg/hr (04/27/24 0734)   heparin 1,700 Units/hr (04/27/24 0308)   PRN  Meds: acetaminophen  **OR** acetaminophen , ondansetron **OR** ondansetron (ZOFRAN) IV  Allergies:   No Known Allergies  Social History:   Social History   Socioeconomic History   Marital status: Married    Spouse name: Not on file   Number of children: Not on file   Years of education: Not on file   Highest education level: 12th grade  Occupational History   Not on file  Tobacco Use   Smoking status: Former    Types: E-cigarettes    Quit date: 03/02/2012    Years since quitting: 12.1   Smokeless tobacco: Never  Vaping Use   Vaping status: Former  Substance and Sexual Activity   Alcohol use: Not Currently    Alcohol/week: 3.0 standard drinks of alcohol    Types: 3 Standard drinks or equivalent per week   Drug use: No   Sexual activity: Yes  Other Topics Concern   Not on file  Social History Narrative   Not on file   Social Drivers of Health   Financial Resource Strain: Low Risk  (04/25/2024)   Overall Financial Resource Strain (CARDIA)    Difficulty of Paying Living Expenses: Not hard at all  Food Insecurity: No Food Insecurity (04/26/2024)   Hunger Vital Sign    Worried About Running Out of Food in the Last Year:  Never true    Ran Out of Food in the Last Year: Never true  Transportation Needs: No Transportation Needs (04/26/2024)   PRAPARE - Administrator, Civil Service (Medical): No    Lack of Transportation (Non-Medical): No  Physical Activity: Inactive (04/25/2024)   Exercise Vital Sign    Days of Exercise per Week: 0 days    Minutes of Exercise per Session: 0 min  Stress: Stress Concern Present (04/25/2024)   [employer] of Occupational Health - Occupational Stress Questionnaire    Feeling of Stress : Rather much  Social Connections: Moderately Integrated (04/25/2024)   Social Connection and Isolation Panel [NHANES]    Frequency of Communication with Friends and Family: More than three times a week    Frequency of Social Gatherings with Friends and  Family: Twice a week    Attends Religious Services: More than 4 times per year    Active Member of [employer] or Organizations: No    Attends Banker Meetings: Never    Marital Status: Married  Catering manager Violence: Not At Risk (04/26/2024)   Humiliation, Afraid, Rape, and Kick questionnaire    Fear of Current or Ex-Partner: No    Emotionally Abused: No    Physically Abused: No    Sexually Abused: No    Family History:    Family History  Problem Relation Age of Onset   Anxiety disorder Mother    Hypertension Mother    Depression Father    Heart disease Father    CAD Father        CABG x 3 in 2000   Stroke Maternal Grandfather    Alcohol abuse Maternal Uncle    Alcohol abuse Paternal Uncle    Colon cancer Neg Hx    Colon polyps Neg Hx    Esophageal cancer Neg Hx    Stomach cancer Neg Hx    Rectal cancer Neg Hx      ROS:  Please see the history of present illness.   All other ROS reviewed and negative.     Physical Exam/Data: Vitals:   04/27/24 0416 04/27/24 0500 04/27/24 0700 04/27/24 0800  BP:  (!) 119/92 105/82 108/72  Pulse: 70 93 63 (!) 52  Resp: 13 14 (!) 22 17  Temp: (!) 97.5 F (36.4 C)     TempSrc: Oral     SpO2: 94% 96% 98% 95%  Weight:      Height:        Intake/Output Summary (Last 24 hours) at 04/27/2024 0930 Last data filed at 04/27/2024 0756 Gross per 24 hour  Intake 568.94 ml  Output 1100 ml  Net -531.06 ml      04/26/2024    3:28 PM 04/26/2024   11:35 AM 04/26/2024   10:23 AM  Last 3 Weights  Weight (lbs) 253 lb 1.4 oz 251 lb 251 lb 9.6 oz  Weight (kg) 114.8 kg 113.853 kg 114.125 kg     Body mass index is 32.49 kg/m.  General:  Well nourished, well developed, in no acute distress HEENT: normal Neck: no JVD Vascular: No carotid bruits; Distal pulses 2+ bilaterally Cardiac:  irreg Lungs:  bilateral crackles Abd: soft, nontender, no hepatomegaly  Ext: no edema Musculoskeletal:  No deformities, BUE and BLE strength normal and  equal Skin: warm and dry  Neuro:  CNs 2-12 intact, no focal abnormalities noted Psych:  Normal affect     Laboratory Data: High Sensitivity Troponin:   Recent Labs  Lab 04/26/24 1157 04/26/24 1424  TROPONINIHS 14 12     Chemistry Recent Labs  Lab 04/26/24 1157 04/27/24 0504  NA 139 136  K 3.9 3.4*  CL 104 104  CO2 24 24  GLUCOSE 90 101*  BUN 11 13  CREATININE 0.94 0.86  CALCIUM  8.7* 8.9  MG 2.1 2.3  GFRNONAA >60 >60  ANIONGAP 11 8    No results for input(s): "PROT", "ALBUMIN", "AST", "ALT", "ALKPHOS", "BILITOT" in the last 168 hours. Lipids No results for input(s): "CHOL", "TRIG", "HDL", "LABVLDL", "LDLCALC", "CHOLHDL" in the last 168 hours.  Hematology Recent Labs  Lab 04/26/24 1157 04/27/24 0504  WBC 6.9 7.6  RBC 4.51 4.61  HGB 12.5* 12.9*  HCT 40.4 40.4  MCV 89.6 87.6  MCH 27.7 28.0  MCHC 30.9 31.9  RDW 14.7 14.7  PLT 183 168   Thyroid   Recent Labs  Lab 04/27/24 0504  TSH 1.558  FREET4 1.03    BNP Recent Labs  Lab 04/26/24 1157  BNP 367.0*    DDimer No results for input(s): "DDIMER" in the last 168 hours.  Radiology/Studies:  DG Chest Portable 1 View Result Date: 04/26/2024 CLINICAL DATA:  Shortness of breath EXAM: PORTABLE CHEST 1 VIEW COMPARISON:  None Available. FINDINGS: The heart size and mediastinal contours are within normal limits. Both lungs are clear. The visualized skeletal structures are unremarkable. IMPRESSION: No active disease. Electronically Signed   By: Fredrich Jefferson M.D.   On: 04/26/2024 15:47     Assessment and Plan: 1.Afib Isolated episode in 2019, DCCV at that time without clear recurrence until this admission. Had not been maintained on anticoag due to low CHADS2Vasc score at the time and isolated episode - presented with afib with RVR. No palpitations, has had 2 months of SOB.,  - started on dilt gtt. Start lopressor  25mg  q 6 hrs with hold parameters, wean dilt gtt - started on hep gtt, CHADS2Vasc score is at least  1, echo pending. Has some clinical signs of HF which may increase his risk score. Plan for eliquis  now in case DCCV is indicated in near future.    2.Acute heart failure, unknown type - crackles on exam, elevated BNP - echo is pending - likely exacerbated by afib with RVR - received IV lasix 20mg  x 1, give additional 20mg  IV today. (Lasix naive) - risk for tachy mediated CM, unknown duration of recent afib but has been SOB x 2 months    For questions or updates, please contact [facility] Please consult [email or web address] for contact info under    Signed, Armida Lander, MD  04/27/2024 9:30 AM

## 2024-04-27 NOTE — TOC CM/SW Note (Signed)
 Transition of Care [facility]) - Inpatient Brief Assessment   Patient Details  Name: Jason Fleming MRN: [id number] Date of Birth: 02/21/1961  Transition of Care [facility]) CM/SW Contact:    Grandville Lax, LCSWA Phone Number: 04/27/2024, 11:58 AM   Clinical Narrative: Transition of Care Department [facility]) has reviewed patient and no TOC needs have been identified at this time. We will continue to monitor patient advancement through interdiciplinary progression rounds. If new patient transition needs arise, please place a TOC consult.  Transition of Care Asessment: Insurance and Status: Insurance coverage has been reviewed Patient has primary care physician: Yes Home environment has been reviewed: From home Prior level of function:: Independent Prior/Current Home Services: No current home services Social Drivers of Health Review: SDOH reviewed no interventions necessary Readmission risk has been reviewed: Yes Transition of care needs: no transition of care needs at this time

## 2024-04-27 NOTE — Telephone Encounter (Signed)
 Requested medication (s) are due for refill today: yes except for Rosuvastatin   Requested medication (s) are on the active medication list: yes  Last refill:   amlodipine : 06/18/23 #90  3 RF Rosuvastatin :07/29/23 #90 3 RF Venlafaxine : 06/23/23 #30  Future visit scheduled: yes   Notes to clinic:  Pt is in hospital   Requested Prescriptions  Pending Prescriptions Disp Refills   amLODipine  (NORVASC ) 5 MG tablet [Pharmacy Med Name: amLODIPine  Besylate 5 MG Oral Tablet] 90 tablet 3    Sig: TAKE 1 TABLET BY MOUTH DAILY     Cardiovascular: Calcium  Channel Blockers 2 Failed - 04/27/2024  4:07 PM      Failed - Last BP in normal range    BP Readings from Last 1 Encounters:  04/27/24 (!) 76/62         Failed - Valid encounter within last 6 months    Recent Outpatient Visits           Yesterday Tachycardia   [place] Austine Lefort, MD   9 months ago Pure hypercholesterolemia   [place] [facility] Austine Lefort, MD   1 year ago Benign essential HTN   [place] [facility] Pickard, Cisco Crest, MD       Future Appointments             In 1 month Swinyer, Leilani Punter, NP [facility] at [facility], [place]            Passed - Last Heart Rate in normal range    Pulse Readings from Last 1 Encounters:  04/27/24 (!) 54          rosuvastatin  (CRESTOR ) 20 MG tablet [Pharmacy Med Name: Rosuvastatin  Calcium  20 MG Oral Tablet] 90 tablet 3    Sig: TAKE 1 TABLET BY MOUTH DAILY     Cardiovascular:  Antilipid - Statins 2 Failed - 04/27/2024  4:07 PM      Failed - Cr in normal range and within 360 days    Creat  Date Value Ref Range Status  07/14/2023 1.22 0.70 - 1.35 mg/dL Final   Creatinine, Ser  Date Value Ref Range Status  04/27/2024 1.39 (H) 0.61 - 1.24 mg/dL Final         Failed - Lipid Panel in normal range within the last 12 months    Cholesterol  Date Value Ref Range Status   07/14/2023 136 <200 mg/dL Final   LDL Cholesterol (Calc)  Date Value Ref Range Status  07/14/2023 57 mg/dL (calc) Final    Comment:    Reference range: <100 . Desirable range <100 mg/dL for primary prevention;   <70 mg/dL for patients with CHD or diabetic patients  with > or = 2 CHD risk factors. Aaron Aas LDL-C is now calculated using the Martin-Hopkins  calculation, which is a validated novel method providing  better accuracy than the Friedewald equation in the  estimation of LDL-C.  Melinda Sprawls et al. Erroll Heard. 8119;147(82): 2061-2068  (http://education.[facility].com/faq/FAQ164)    HDL  Date Value Ref Range Status  07/14/2023 52 > OR = 40 mg/dL Final   Triglycerides  Date Value Ref Range Status  07/14/2023 200 (H) <150 mg/dL Final    Comment:    . If a non-fasting specimen was collected, consider repeat triglyceride testing on a fasting specimen if clinically indicated.  Imagene Mam et al. J. of Clin. Lipidol. 2015;9:129-169. Aaron Aas  Passed - Patient is not pregnant      Passed - Valid encounter within last 12 months    Recent Outpatient Visits           Yesterday Tachycardia   [place] [facility] Pickard, Cisco Crest, MD   9 months ago Pure hypercholesterolemia   [place] [facility] Austine Lefort, MD   1 year ago Benign essential HTN   [facility] Pickard, Cisco Crest, MD       Future Appointments             In 1 month Swinyer, Leilani Punter, NP [facility] at [facility], [place]             venlafaxine  XR (EFFEXOR -XR) 150 MG 24 hr capsule [Pharmacy Med Name: Venlafaxine  HCl ER 150 MG Oral Capsule Extended Release 24 Hour] 30 capsule 11    Sig: TAKE 1 CAPSULE BY MOUTH DAILY  WITH BREAKFAST (NEED APPT WITH  PCP)     Psychiatry: Antidepressants - SNRI - desvenlafaxine & venlafaxine  Failed - 04/27/2024  4:07 PM      Failed - Cr in normal range and within 360 days     Creat  Date Value Ref Range Status  07/14/2023 1.22 0.70 - 1.35 mg/dL Final   Creatinine, Ser  Date Value Ref Range Status  04/27/2024 1.39 (H) 0.61 - 1.24 mg/dL Final         Failed - Last BP in normal range    BP Readings from Last 1 Encounters:  04/27/24 (!) 76/62         Failed - Valid encounter within last 6 months    Recent Outpatient Visits           Yesterday Tachycardia   [facility] Austine Lefort, MD   9 months ago Pure hypercholesterolemia   [place] [facility] Pickard, Cisco Crest, MD   1 year ago Benign essential HTN   [place] [facility] Pickard, Cisco Crest, MD       Future Appointments             In 1 month Swinyer, Leilani Punter, NP [place] [facility]            Failed - Lipid Panel in normal range within the last 12 months    Cholesterol  Date Value Ref Range Status  07/14/2023 136 <200 mg/dL Final   LDL Cholesterol (Calc)  Date Value Ref Range Status  07/14/2023 57 mg/dL (calc) Final    Comment:    Reference range: <100 . Desirable range <100 mg/dL for primary prevention;   <70 mg/dL for patients with CHD or diabetic patients  with > or = 2 CHD risk factors. Aaron Aas LDL-C is now calculated using the Martin-Hopkins  calculation, which is a validated novel method providing  better accuracy than the Friedewald equation in the  estimation of LDL-C.  Melinda Sprawls et al. Erroll Heard. 9562;130(86): 2061-2068  (http://education.[facility].com/faq/FAQ164)    HDL  Date Value Ref Range Status  07/14/2023 52 > OR = 40 mg/dL Final   Triglycerides  Date Value Ref Range Status  07/14/2023 200 (H) <150 mg/dL Final    Comment:    . If a non-fasting specimen was collected, consider repeat triglyceride testing on a fasting specimen if clinically indicated.  Imagene Mam et al. J. of Clin.  Lipidol. 2015;9:129-169. Aaron Aas          Passed - Completed PHQ-2 or  PHQ-9 in the last 360 days

## 2024-04-28 ENCOUNTER — Telehealth (HOSPITAL_COMMUNITY): Payer: Self-pay | Admitting: Pharmacy Technician

## 2024-04-28 ENCOUNTER — Encounter (HOSPITAL_COMMUNITY): Payer: Self-pay | Admitting: Cardiology

## 2024-04-28 ENCOUNTER — Encounter: Payer: Self-pay | Admitting: Family Medicine

## 2024-04-28 ENCOUNTER — Other Ambulatory Visit (HOSPITAL_COMMUNITY): Payer: Self-pay

## 2024-04-28 ENCOUNTER — Encounter (HOSPITAL_COMMUNITY)
Admission: EM | Disposition: A | Discharge status: Home / Self Care | Payer: Self-pay | Source: Home / Self Care | Attending: Student in an Organized Health Care Education/Training Program

## 2024-04-28 DIAGNOSIS — I2510 Atherosclerotic heart disease of native coronary artery without angina pectoris: Secondary | ICD-10-CM

## 2024-04-28 DIAGNOSIS — I5021 Acute systolic (congestive) heart failure: Secondary | ICD-10-CM

## 2024-04-28 HISTORY — PX: RIGHT/LEFT HEART CATH AND CORONARY ANGIOGRAPHY: CATH118266

## 2024-04-28 LAB — POCT I-STAT EG7
Acid-base deficit: 4 mmol/L — ABNORMAL HIGH (ref 0.0–2.0)
Acid-base deficit: 4 mmol/L — ABNORMAL HIGH (ref 0.0–2.0)
Bicarbonate: 21.1 mmol/L (ref 20.0–28.0)
Bicarbonate: 21.2 mmol/L (ref 20.0–28.0)
Calcium, Ion: 1.13 mmol/L — ABNORMAL LOW (ref 1.15–1.40)
Calcium, Ion: 1.16 mmol/L (ref 1.15–1.40)
HCT: 30 % — ABNORMAL LOW (ref 39.0–52.0)
HCT: 31 % — ABNORMAL LOW (ref 39.0–52.0)
Hemoglobin: 10.2 g/dL — ABNORMAL LOW (ref 13.0–17.0)
Hemoglobin: 10.5 g/dL — ABNORMAL LOW (ref 13.0–17.0)
O2 Saturation: 62 %
O2 Saturation: 65 %
Potassium: 3.8 mmol/L (ref 3.5–5.1)
Potassium: 3.9 mmol/L (ref 3.5–5.1)
Sodium: 140 mmol/L (ref 135–145)
Sodium: 140 mmol/L (ref 135–145)
TCO2: 22 mmol/L (ref 22–32)
TCO2: 22 mmol/L (ref 22–32)
pCO2, Ven: 36.5 mmHg — ABNORMAL LOW (ref 44–60)
pCO2, Ven: 36.5 mmHg — ABNORMAL LOW (ref 44–60)
pH, Ven: 7.37 (ref 7.25–7.43)
pH, Ven: 7.371 (ref 7.25–7.43)
pO2, Ven: 33 mmHg (ref 32–45)
pO2, Ven: 34 mmHg (ref 32–45)

## 2024-04-28 LAB — COMPREHENSIVE METABOLIC PANEL WITH GFR
ALT: 892 U/L — ABNORMAL HIGH (ref 0–44)
AST: 1113 U/L — ABNORMAL HIGH (ref 15–41)
Albumin: 3 g/dL — ABNORMAL LOW (ref 3.5–5.0)
Alkaline Phosphatase: 37 U/L — ABNORMAL LOW (ref 38–126)
Anion gap: 10 (ref 5–15)
BUN: 18 mg/dL (ref 8–23)
CO2: 19 mmol/L — ABNORMAL LOW (ref 22–32)
Calcium: 8.1 mg/dL — ABNORMAL LOW (ref 8.9–10.3)
Chloride: 102 mmol/L (ref 98–111)
Creatinine, Ser: 1.77 mg/dL — ABNORMAL HIGH (ref 0.61–1.24)
GFR, Estimated: 43 mL/min — ABNORMAL LOW (ref >60)
Glucose, Bld: 239 mg/dL — ABNORMAL HIGH (ref 70–99)
Potassium: 4.2 mmol/L (ref 3.5–5.1)
Sodium: 131 mmol/L — ABNORMAL LOW (ref 135–145)
Total Bilirubin: 1.5 mg/dL — ABNORMAL HIGH (ref 0.0–1.2)
Total Protein: 5.8 g/dL — ABNORMAL LOW (ref 6.5–8.1)

## 2024-04-28 LAB — BASIC METABOLIC PANEL WITH GFR
Anion gap: 5 (ref 5–15)
BUN: 16 mg/dL (ref 8–23)
CO2: 18 mmol/L — ABNORMAL LOW (ref 22–32)
Calcium: 6.6 mg/dL — ABNORMAL LOW (ref 8.9–10.3)
Chloride: 116 mmol/L — ABNORMAL HIGH (ref 98–111)
Creatinine, Ser: 1.35 mg/dL — ABNORMAL HIGH (ref 0.61–1.24)
GFR, Estimated: 59 mL/min — ABNORMAL LOW (ref >60)
Glucose, Bld: 105 mg/dL — ABNORMAL HIGH (ref 70–99)
Potassium: 3.1 mmol/L — ABNORMAL LOW (ref 3.5–5.1)
Sodium: 141 mmol/L (ref 135–145)

## 2024-04-28 LAB — LIPID PANEL
Cholesterol: 56 mg/dL (ref 0–200)
HDL: 19 mg/dL — ABNORMAL LOW (ref >40)
LDL Cholesterol: 26 mg/dL (ref 0–99)
Total CHOL/HDL Ratio: 2.9 ratio
Triglycerides: 57 mg/dL (ref <150)
VLDL: 11 mg/dL (ref 0–40)

## 2024-04-28 LAB — CBC
HCT: 33.4 % — ABNORMAL LOW (ref 39.0–52.0)
HCT: 33.9 % — ABNORMAL LOW (ref 39.0–52.0)
Hemoglobin: 10.5 g/dL — ABNORMAL LOW (ref 13.0–17.0)
Hemoglobin: 10.6 g/dL — ABNORMAL LOW (ref 13.0–17.0)
MCH: 27.3 pg (ref 26.0–34.0)
MCH: 27.4 pg (ref 26.0–34.0)
MCHC: 31.3 g/dL (ref 30.0–36.0)
MCHC: 31.4 g/dL (ref 30.0–36.0)
MCV: 86.8 fL (ref 80.0–100.0)
MCV: 87.6 fL (ref 80.0–100.0)
Platelets: 143 10*3/uL — ABNORMAL LOW (ref 150–400)
Platelets: 146 10*3/uL — ABNORMAL LOW (ref 150–400)
RBC: 3.85 MIL/uL — ABNORMAL LOW (ref 4.22–5.81)
RBC: 3.87 MIL/uL — ABNORMAL LOW (ref 4.22–5.81)
RDW: 14.6 % (ref 11.5–15.5)
RDW: 14.6 % (ref 11.5–15.5)
WBC: 11.3 10*3/uL — ABNORMAL HIGH (ref 4.0–10.5)
WBC: 9.4 10*3/uL (ref 4.0–10.5)
nRBC: 0 % (ref 0.0–0.2)
nRBC: 0 % (ref 0.0–0.2)

## 2024-04-28 LAB — COOXEMETRY PANEL
Carboxyhemoglobin: 1.4 % (ref 0.5–1.5)
Carboxyhemoglobin: 1.8 % — ABNORMAL HIGH (ref 0.5–1.5)
Carboxyhemoglobin: 2.8 % — ABNORMAL HIGH (ref 0.5–1.5)
Methemoglobin: 0.8 % (ref 0.0–1.5)
Methemoglobin: <0.7 % (ref 0.0–1.5)
Methemoglobin: <0.7 % (ref 0.0–1.5)
O2 Saturation: 70.9 %
O2 Saturation: 79.3 %
O2 Saturation: 80.8 %
Total hemoglobin: 11 g/dL — ABNORMAL LOW (ref 12.0–16.0)
Total hemoglobin: 11.1 g/dL — ABNORMAL LOW (ref 12.0–16.0)
Total hemoglobin: 11.1 g/dL — ABNORMAL LOW (ref 12.0–16.0)

## 2024-04-28 LAB — HEMOGLOBIN A1C
Hgb A1c MFr Bld: 6.3 % — ABNORMAL HIGH (ref 4.8–5.6)
Mean Plasma Glucose: 134.11 mg/dL

## 2024-04-28 LAB — LACTIC ACID, PLASMA
Lactic Acid, Venous: 1.3 mmol/L (ref 0.5–1.9)
Lactic Acid, Venous: 1.1 mmol/L (ref 0.5–1.9)

## 2024-04-28 LAB — HEPATIC FUNCTION PANEL
ALT: 873 U/L — ABNORMAL HIGH (ref 0–44)
AST: 1294 U/L — ABNORMAL HIGH (ref 15–41)
Albumin: 2.3 g/dL — ABNORMAL LOW (ref 3.5–5.0)
Alkaline Phosphatase: 28 U/L — ABNORMAL LOW (ref 38–126)
Bilirubin, Direct: 0.3 mg/dL — ABNORMAL HIGH (ref 0.0–0.2)
Indirect Bilirubin: 0.7 mg/dL (ref 0.3–0.9)
Total Bilirubin: 1 mg/dL (ref 0.0–1.2)
Total Protein: 4.5 g/dL — ABNORMAL LOW (ref 6.5–8.1)

## 2024-04-28 LAB — MAGNESIUM: Magnesium: 1.5 mg/dL — ABNORMAL LOW (ref 1.7–2.4)

## 2024-04-28 SURGERY — RIGHT/LEFT HEART CATH AND CORONARY ANGIOGRAPHY
Anesthesia: LOCAL

## 2024-04-28 MED ORDER — LIDOCAINE HCL (PF) 1 % IJ SOLN
INTRAMUSCULAR | Status: AC
Start: 2024-04-28 — End: ?
  Filled 2024-04-28: qty 30

## 2024-04-28 MED ORDER — SODIUM CHLORIDE 0.9 % IV SOLN: 250.0000 mL | INTRAVENOUS | Status: AC | PRN

## 2024-04-28 MED ORDER — HEPARIN (PORCINE) 25000 UT/250ML-% IV SOLN
1700.0000 [IU]/h | INTRAVENOUS | Status: DC
  Administered 2024-04-28: 1700 [IU]/h via INTRAVENOUS
  Filled 2024-04-28: qty 250

## 2024-04-28 MED ORDER — MIDAZOLAM HCL 2 MG/2ML IJ SOLN
INTRAMUSCULAR | Status: AC
  Filled 2024-04-28: qty 2

## 2024-04-28 MED ORDER — HEPARIN SODIUM (PORCINE) 1000 UNIT/ML IJ SOLN
INTRAMUSCULAR | Status: AC
  Filled 2024-04-28: qty 10

## 2024-04-28 MED ORDER — HEPARIN (PORCINE) 25000 UT/250ML-% IV SOLN
1700.0000 [IU]/h | INTRAVENOUS | Status: DC
  Administered 2024-04-29 – 2024-04-30 (×3): 1700 [IU]/h via INTRAVENOUS
  Filled 2024-04-28 (×2): qty 250

## 2024-04-28 MED ORDER — FUROSEMIDE 10 MG/ML IJ SOLN: 80.0000 mg | Freq: Two times a day (BID) | INTRAMUSCULAR | Status: DC

## 2024-04-28 MED ORDER — FUROSEMIDE 10 MG/ML IJ SOLN
60.0000 mg | Freq: Two times a day (BID) | INTRAMUSCULAR | Status: AC
  Administered 2024-04-28: 60 mg via INTRAVENOUS
  Administered 2024-04-29: 80 mg via INTRAVENOUS
  Filled 2024-04-28 (×2): qty 6

## 2024-04-28 MED ORDER — SODIUM CHLORIDE 0.9% FLUSH: 3.0000 mL | INTRAVENOUS | Status: DC | PRN

## 2024-04-28 MED ORDER — FENTANYL CITRATE (PF) 100 MCG/2ML IJ SOLN
INTRAMUSCULAR | Status: AC
  Filled 2024-04-28: qty 2

## 2024-04-28 MED ORDER — LABETALOL HCL 5 MG/ML IV SOLN: 10.0000 mg | INTRAVENOUS | Status: AC | PRN

## 2024-04-28 MED ORDER — IOHEXOL 350 MG/ML SOLN
INTRAVENOUS | Status: DC | PRN
  Administered 2024-04-28: 40 mL via INTRA_ARTERIAL

## 2024-04-28 MED ORDER — POTASSIUM CHLORIDE CRYS ER 20 MEQ PO TBCR
60.0000 meq | EXTENDED_RELEASE_TABLET | Freq: Once | ORAL | Status: AC
  Administered 2024-04-28: 60 meq via ORAL
  Filled 2024-04-28: qty 3

## 2024-04-28 MED ORDER — AMIODARONE HCL IN DEXTROSE 360-4.14 MG/200ML-% IV SOLN
60.0000 mg/h | INTRAVENOUS | Status: AC
  Administered 2024-04-28 (×2): 60 mg/h via INTRAVENOUS
  Filled 2024-04-28: qty 400

## 2024-04-28 MED ORDER — ASPIRIN 81 MG PO TBEC
81.0000 mg | DELAYED_RELEASE_TABLET | Freq: Every day | ORAL | Status: DC
  Administered 2024-04-28 – 2024-05-03 (×6): 81 mg via ORAL
  Filled 2024-04-28 (×6): qty 1

## 2024-04-28 MED ORDER — SODIUM CHLORIDE 0.9 % IV SOLN: INTRAVENOUS | Status: DC

## 2024-04-28 MED ORDER — SODIUM CHLORIDE 0.9% FLUSH
3.0000 mL | Freq: Two times a day (BID) | INTRAVENOUS | Status: DC
  Administered 2024-04-28 – 2024-04-30 (×4): 3 mL via INTRAVENOUS

## 2024-04-28 MED ORDER — POTASSIUM CHLORIDE CRYS ER 20 MEQ PO TBCR
40.0000 meq | EXTENDED_RELEASE_TABLET | Freq: Once | ORAL | Status: AC
  Administered 2024-04-28: 40 meq via ORAL
  Filled 2024-04-28: qty 2

## 2024-04-28 MED ORDER — AMIODARONE LOAD VIA INFUSION
150.0000 mg | Freq: Once | INTRAVENOUS | Status: AC
  Administered 2024-04-28: 150 mg via INTRAVENOUS
  Filled 2024-04-28: qty 83.34

## 2024-04-28 MED ORDER — HEPARIN (PORCINE) IN NACL 1000-0.9 UT/500ML-% IV SOLN
INTRAVENOUS | Status: DC | PRN
  Administered 2024-04-28: 1000 mL

## 2024-04-28 MED ORDER — VERAPAMIL HCL 2.5 MG/ML IV SOLN
INTRAVENOUS | Status: AC
  Filled 2024-04-28: qty 2

## 2024-04-28 MED ORDER — HYDRALAZINE HCL 20 MG/ML IJ SOLN: 10.0000 mg | INTRAMUSCULAR | Status: AC | PRN

## 2024-04-28 MED ORDER — VERAPAMIL HCL 2.5 MG/ML IV SOLN
INTRAVENOUS | Status: DC | PRN
  Administered 2024-04-28: 10 mL via INTRA_ARTERIAL

## 2024-04-28 MED ORDER — MAGNESIUM SULFATE 4 GM/100ML IV SOLN
4.0000 g | Freq: Once | INTRAVENOUS | Status: AC
  Administered 2024-04-28: 4 g via INTRAVENOUS
  Filled 2024-04-28: qty 100

## 2024-04-28 MED ORDER — FENTANYL CITRATE (PF) 100 MCG/2ML IJ SOLN
INTRAMUSCULAR | Status: DC | PRN
  Administered 2024-04-28: 25 ug via INTRAVENOUS

## 2024-04-28 MED ORDER — HEPARIN SODIUM (PORCINE) 1000 UNIT/ML IJ SOLN
INTRAMUSCULAR | Status: DC | PRN
  Administered 2024-04-28: 5000 [IU] via INTRAVENOUS

## 2024-04-28 MED ORDER — AMIODARONE HCL IN DEXTROSE 360-4.14 MG/200ML-% IV SOLN
60.0000 mg/h | INTRAVENOUS | Status: DC
  Administered 2024-04-28 (×2): 30 mg/h via INTRAVENOUS
  Administered 2024-04-29 – 2024-05-02 (×11): 60 mg/h via INTRAVENOUS
  Filled 2024-04-28 (×14): qty 200

## 2024-04-28 MED ORDER — ACETAMINOPHEN 325 MG PO TABS
650.0000 mg | ORAL_TABLET | ORAL | Status: DC | PRN
Start: 2024-04-28 — End: 2024-05-03

## 2024-04-28 MED ORDER — LIDOCAINE HCL (PF) 1 % IJ SOLN
INTRAMUSCULAR | Status: DC | PRN
  Administered 2024-04-28 (×3): 2 mL

## 2024-04-28 MED ORDER — MIDAZOLAM HCL 2 MG/2ML IJ SOLN
INTRAMUSCULAR | Status: DC | PRN
  Administered 2024-04-28: 1 mg via INTRAVENOUS

## 2024-04-28 MED ORDER — ONDANSETRON HCL 4 MG/2ML IJ SOLN: 4.0000 mg | Freq: Four times a day (QID) | INTRAMUSCULAR | Status: DC | PRN

## 2024-04-28 SURGICAL SUPPLY — 11 items
CATH 5FR JL3.5 JR4 ANG PIG MP (CATHETERS) IMPLANT
CATH SWAN GANZ 7F STRAIGHT (CATHETERS) IMPLANT
DEVICE RAD COMP TR BAND LRG (VASCULAR PRODUCTS) IMPLANT
GLIDESHEATH SLEND SS 6F .021 (SHEATH) IMPLANT
GLIDESHEATH SLENDER 7FR .021G (SHEATH) IMPLANT
GUIDEWIRE INQWIRE 1.5J.035X260 (WIRE) IMPLANT
KIT SINGLE USE MANIFOLD (KITS) IMPLANT
PACK CARDIAC CATHETERIZATION (CUSTOM PROCEDURE TRAY) ×2 IMPLANT
SET ATX-X65L (MISCELLANEOUS) IMPLANT
SHEATH PROBE COVER 6X72 (BAG) IMPLANT
WIRE MICROINTRODUCER 60CM (WIRE) IMPLANT

## 2024-04-28 NOTE — Plan of Care (Signed)

## 2024-04-28 NOTE — Interval H&P Note (Signed)
 History and Physical Interval Note:  04/28/2024 5:19 PM  Jason Fleming  has presented today for surgery, with the diagnosis of heart failure.  The various methods of treatment have been discussed with the patient and family. After consideration of risks, benefits and other options for treatment, the patient has consented to  Procedure(s): RIGHT/LEFT HEART CATH AND CORONARY ANGIOGRAPHY (N/A) as a surgical intervention.  The patient's history has been reviewed, patient examined, no change in status, stable for surgery.  I have reviewed the patient's chart and labs.  Questions were answered to the patient's satisfaction.     Delorus Langwell [employer]

## 2024-04-28 NOTE — TOC Initial Note (Signed)
 Transition of Care [facility]) - Initial/Assessment Note    Patient Details  Name: Jason Fleming MRN: [id number] Date of Birth: March 10, 1961  Transition of Care [facility]) CM/SW Contact:    Ernst Heap Phone Number: [phone] 04/28/2024, 4:05 PM  Clinical Narrative:   HF CSW met with patient and wife at bedside. Patient stated that he and his wife live together. Patient stated that he has no history of HH services. Patient stated that he does not use any equipment. Patient stated that he has a scale at home. Patient stated that he works and is self-employed. Patient stated that he drives. Patient stated that he has a PCP. CSW explained that a hospital follow up appointment will be scheduled closer towards dc. Patient agrees.   TOC will continue following.           Expected Discharge Plan: Home/Self Care Barriers to Discharge: Continued Medical Work up   Patient Goals and CMS Choice            Expected Discharge Plan and Services       Living arrangements for the past 2 months: Single Family Home                                      Prior Living Arrangements/Services Living arrangements for the past 2 months: Single Family Home Lives with:: Spouse Patient language and need for interpreter reviewed:: Yes Do you feel safe going back to the place where you live?: Yes      Need for Family Participation in Patient Care: No (Comment) Care giver support system in place?: Yes (comment)   Criminal Activity/Legal Involvement Pertinent to Current Situation/Hospitalization: No - Comment as needed  Activities of Daily Living   ADL Screening (condition at time of admission) Independently performs ADLs?: Yes (appropriate for developmental age) Is the patient deaf or have difficulty hearing?: No Does the patient have difficulty seeing, even when wearing glasses/contacts?: No Does the patient have difficulty concentrating, remembering, or making decisions?: No  Permission  Sought/Granted                  Emotional Assessment Appearance:: Appears stated age Attitude/Demeanor/Rapport: Engaged Affect (typically observed): Appropriate Orientation: : Oriented to Self, Oriented to Place, Oriented to  Time, Oriented to Situation Alcohol / Substance Use: Not Applicable Psych Involvement: No (comment)  Admission diagnosis:  Atrial fibrillation with rapid ventricular response (HCC) [I48.91] Atrial fibrillation with RVR (HCC) [I48.91] Acute congestive heart failure (HCC) [I50.9] Shock (HCC) [R57.9] Patient Active Problem List   Diagnosis Date Noted   Acute congestive heart failure (HCC) 04/27/2024   Cardiogenic shock (HCC) 04/27/2024   Shock (HCC) 04/27/2024   Atrial fibrillation with RVR (HCC) 04/26/2024   Obesity (BMI 30-39.9) 04/26/2024   Essential hypertension 04/26/2024   Paroxysmal atrial fibrillation (HCC)    Depression    Anxiety    PCP:  Austine Lefort, MD Pharmacy:   [facility] [phone] Jonette Nestle, [place] - [address] [place] [address] Phone: [phone] Fax: [phone]  [facility] - [place] - [address] [place] [address] Phone: [phone] Fax: [phone]  [facility] [address] [place] [address] Phone: [phone] Fax: [phone]     Social Drivers of Health (SDOH) Social History:  SDOH Screenings   Food Insecurity: No Food Insecurity (04/26/2024)  Housing: Low Risk  (04/26/2024)  Transportation Needs: No Transportation Needs (04/26/2024)  Utilities: Not At Risk (04/26/2024)  Alcohol Screen: Low Risk  (04/25/2024)  Depression (PHQ2-9): Low Risk  (04/26/2024)  Financial Resource Strain: Low Risk  (04/25/2024)  Physical Activity: Inactive (04/25/2024)  Social Connections: Moderately Integrated (04/25/2024)  Stress: Stress Concern Present (04/25/2024)  Tobacco Use: Medium  Risk (04/26/2024)  Health Literacy: Adequate Health Literacy (08/13/2023)   SDOH Interventions:     Readmission Risk Interventions     No data to display

## 2024-04-28 NOTE — H&P (View-Only) (Signed)
 Advanced Heart Failure Rounding Note  Cardiologist: Maudine Sos, MD   Chief Complaint: Cardiogenic shock  Subjective:    Off NE this am. Remains on 0.25 milrinone, CO-OX 81% (doubt this is accurate) >>> recheck pending.   CVP 9.   Remains in AF, ventricular rate 90s-110s. Tachycardic with light activity.  Feeling better this am. No dyspnea at rest or orthopnea.    Objective:   Weight Range: 118.4 kg Body mass index is 33.51 kg/m.   Vital Signs:   Temp:  [97.4 F (36.3 C)-98.5 F (36.9 C)] 98.5 F (36.9 C) (06/04 0400) Pulse Rate:  [38-118] 65 (06/04 0700) Resp:  [12-24] 14 (06/04 0700) BP: (62-136)/(42-92) 97/69 (06/04 0700) SpO2:  [85 %-100 %] 88 % (06/04 0700) Weight:  [118.4 kg] 118.4 kg (06/04 0500) Last BM Date :  (PTA)  Weight change: Filed Weights   04/26/24 1135 04/26/24 1528 04/28/24 0500  Weight: 113.9 kg 114.8 kg 118.4 kg    Intake/Output:   Intake/Output Summary (Last 24 hours) at 04/28/2024 0705 Last data filed at 04/28/2024 0700 Gross per 24 hour  Intake 3659.13 ml  Output 1065 ml  Net 2594.13 ml      Physical Exam    General:  No distress. Sitting up in bed. Neck: JVP 10 cm Cor: Irregularly irregular rhythm, tachy. No rubs, gallops or murmurs. Lungs: Clear Abdomen: obese, soft, nontender, mildly distended Extremities: 1+ edema, RUE PICC Neuro: Alert & orientedx3. Affect pleasant   Telemetry   Afib 90s-110s, tachycardic with light activity  EKG    Atrial fibrillation with ventricular rate 160s, poor R wave progression  Labs    CBC Recent Labs    04/27/24 1509 04/28/24 0038  WBC 7.2 11.3*  HGB 12.2* 10.6*  HCT 40.2 33.9*  MCV 90.7 87.6  PLT 176 143*   Basic Metabolic Panel Recent Labs    16/10/96 1157 04/27/24 0504 04/27/24 1509 04/28/24 0038  NA 139 136 134* 131*  K 3.9 3.4* 4.0 4.2  CL 104 104 106 102  CO2 24 24 18* 19*  GLUCOSE 90 101* 139* 239*  BUN 11 13 14 18   CREATININE 0.94 0.86 1.39* 1.77*   CALCIUM  8.7* 8.9 8.2* 8.1*  MG 2.1 2.3  --   --    Liver Function Tests Recent Labs    04/28/24 0038  AST 1,113*  ALT 892*  ALKPHOS 37*  BILITOT 1.5*  PROT 5.8*  ALBUMIN 3.0*   No results for input(s): "LIPASE", "AMYLASE" in the last 72 hours. Cardiac Enzymes No results for input(s): "CKTOTAL", "CKMB", "CKMBINDEX", "TROPONINI" in the last 72 hours.  BNP: BNP (last 3 results) Recent Labs    04/26/24 1157  BNP 367.0*    ProBNP (last 3 results) No results for input(s): "PROBNP" in the last 8760 hours.   D-Dimer No results for input(s): "DDIMER" in the last 72 hours. Hemoglobin A1C No results for input(s): "HGBA1C" in the last 72 hours. Fasting Lipid Panel No results for input(s): "CHOL", "HDL", "LDLCALC", "TRIG", "CHOLHDL", "LDLDIRECT" in the last 72 hours. Thyroid  Function Tests Recent Labs    04/27/24 0504  TSH 1.558    Other results:   Imaging    DG CHEST PORT 1 VIEW Result Date: 04/27/2024 CLINICAL DATA:  PICC line placement. EXAM: PORTABLE CHEST 1 VIEW COMPARISON:  Radiographs 04/26/2024 and 06/09/2018. Cardiac CT 07/18/2023. FINDINGS: 1753 hours. There is a faintly visible right arm PICC which projects to the mid SVC level. No evidence of pneumothorax or  significant pleural effusion. The lungs remain clear. Stable borderline cardiac enlargement. No acute osseous findings are seen. IMPRESSION: Right arm PICC projects to the mid SVC level. No evidence of pneumothorax or other acute cardiopulmonary process. Electronically Signed   By: Elmon Hagedorn M.D.   On: 04/27/2024 18:21   ECHOCARDIOGRAM COMPLETE Result Date: 04/27/2024    ECHOCARDIOGRAM REPORT   Patient Name:   Jason Fleming Date of Exam: 04/27/2024 Medical Rec #:  [id number]      Height:       74.0 in Accession #:    [id number]     Weight:       253.1 lb Date of Birth:  1961/09/24      BSA:          2.402 m Patient Age:    63 years       BP:           90/71 mmHg Patient Gender: M              HR:            67 bpm. Exam Location:  [facility] Procedure: 2D Echo, Cardiac Doppler and Color Doppler (Both Spectral and Color            Flow Doppler were utilized during procedure). Indications:    A-fib  History:        Patient has prior history of Echocardiogram examinations, most                 recent 08/11/2018. Arrythmias:Atrial Fibrillation; Risk                 Factors:Hypertension.  Sonographer:    Jeralene Mom Referring Phys: [phone] DAVID TAT IMPRESSIONS  1. Left ventricular ejection fraction, by estimation, is 25 to 30%. The left ventricle has moderately decreased function. Left ventricular endocardial border not optimally defined to evaluate regional wall motion. Left ventricular diastolic parameters are indeterminate.  2. RV not well visualized. Appears enlarged with roughly moderate systolic dysfunction. Indeterminate PASP, IVC not visualized. . Right ventricular systolic function was not well visualized. The right ventricular size is not well visualized.  3. The mitral valve is abnormal. Moderate mitral valve regurgitation. No evidence of mitral stenosis.  4. The aortic valve is tricuspid. Aortic valve regurgitation is not visualized. No aortic stenosis is present.  5. Limited study, patient uncomfortable with positioning and asked to end study. FINDINGS  Left Ventricle: Left ventricular ejection fraction, by estimation, is 25 to 30%. The left ventricle has moderately decreased function. Left ventricular endocardial border not optimally defined to evaluate regional wall motion. The left ventricular internal cavity size was normal in size. There is no left ventricular hypertrophy. Left ventricular diastolic parameters are indeterminate. Right Ventricle: RV not well visualized. Appears enlarged with roughly moderate systolic dysfunction. Indeterminate PASP, IVC not visualized. The right ventricular size is not well visualized. Right vetricular wall thickness was not well visualized. Right ventricular systolic  function was not well visualized. Left Atrium: Left atrial size was normal in size. Right Atrium: Right atrial size was not well visualized. Pericardium: There is no evidence of pericardial effusion. Mitral Valve: The mitral valve is abnormal. Moderate mitral valve regurgitation. No evidence of mitral valve stenosis. MV peak gradient, 3.6 mmHg. The mean mitral valve gradient is 1.0 mmHg. Tricuspid Valve: The tricuspid valve is not well visualized. Tricuspid valve regurgitation is mild . No evidence of tricuspid stenosis. Aortic Valve: The aortic valve is tricuspid. Aortic valve  regurgitation is not visualized. No aortic stenosis is present. Aortic valve mean gradient measures 1.0 mmHg. Aortic valve peak gradient measures 2.7 mmHg. Aortic valve area, by VTI measures 2.50 cm. Pulmonic Valve: The pulmonic valve was not well visualized. Pulmonic valve regurgitation is not visualized. No evidence of pulmonic stenosis. Aorta: The aortic root and ascending aorta are structurally normal, with no evidence of dilitation.  LEFT VENTRICLE PLAX 2D LVIDd:         5.85 cm      Diastology LVIDs:         4.60 cm      LV e' medial:    12.60 cm/s LV PW:         1.00 cm      LV E/e' medial:  7.3 LV IVS:        1.00 cm      LV e' lateral:   15.60 cm/s LVOT diam:     2.10 cm      LV E/e' lateral: 5.9 LV SV:         39 LV SV Index:   16 LVOT Area:     3.46 cm  LV Volumes (MOD) LV vol d, MOD A4C: 115.0 ml LV vol s, MOD A4C: 58.2 ml LV SV MOD A4C:     115.0 ml RIGHT VENTRICLE RV Basal diam:  4.40 cm RV Mid diam:    3.20 cm RV S prime:     9.03 cm/s LEFT ATRIUM           Index        RIGHT ATRIUM           Index LA diam:      4.60 cm 1.91 cm/m   RA Area:     35.60 cm LA Vol (A4C): 61.7 ml 25.68 ml/m  RA Volume:   134.00 ml 55.78 ml/m  AORTIC VALVE AV Area (Vmax):    2.28 cm AV Area (Vmean):   2.16 cm AV Area (VTI):     2.50 cm AV Vmax:           81.40 cm/s AV Vmean:          54.300 cm/s AV VTI:            0.156 m AV Peak Grad:       2.7 mmHg AV Mean Grad:      1.0 mmHg LVOT Vmax:         53.55 cm/s LVOT Vmean:        33.850 cm/s LVOT VTI:          0.112 m LVOT/AV VTI ratio: 0.72  AORTA Ao Root diam: 3.40 cm Ao Asc diam:  3.40 cm MITRAL VALVE               TRICUSPID VALVE MV Area (PHT): 5.02 cm    TR Peak grad:   12.8 mmHg MV Area VTI:   1.30 cm    TR Vmax:        179.00 cm/s MV Peak grad:  3.6 mmHg MV Mean grad:  1.0 mmHg    SHUNTS MV Vmax:       0.95 m/s    Systemic VTI:  0.11 m MV Vmean:      50.1 cm/s   Systemic Diam: 2.10 cm MV Decel Time: 151 msec MR Peak grad: 51.3 mmHg MR Vmax:      358.00 cm/s MV E velocity: 91.70 cm/s MV A velocity: 40.30 cm/s MV E/A ratio:  2.28 Armida Lander MD Electronically signed by Armida Lander MD Signature Date/Time: 04/27/2024/4:19:51 PM    Final    US  EKG SITE RITE Result Date: 04/27/2024 If Site Rite image not attached, placement could not be confirmed due to current cardiac rhythm.    Medications:     Scheduled Medications:  apixaban   5 mg Oral BID   aspirin  EC  81 mg Oral Daily   Chlorhexidine  Gluconate Cloth  6 each Topical Q0600   rosuvastatin   20 mg Oral Daily   sodium chloride  flush  10-40 mL Intracatheter Q12H   venlafaxine  XR  150 mg Oral Q breakfast    Infusions:  sodium chloride  Stopped (04/27/24 1804)   ceFEPime (MAXIPIME) IV Stopped (04/28/24 0110)   milrinone 0.25 mcg/kg/min (04/28/24 0700)   norepinephrine (LEVOPHED) Adult infusion Stopped (04/28/24 0414)   vancomycin      PRN Medications: acetaminophen , ondansetron **OR** ondansetron (ZOFRAN) IV, mouth rinse, sodium chloride  flush    Patient Profile   63 y.o. male with history of atrial fibrillation 2019, HTN, HLD, elevated coronary calcium  score, prior tobacco use.   Presented with atrial fibrillation with RVR and acute on chronic CHF. Transferred to [facility] after progressing to cardiogenic shock.  Assessment/Plan   Cardiogenic shock Acute on chronic systolic CHF - Suspect he's had LV dysfunction for  some time. Reports dyspnea with exertion with decline in functional capacity over the last 3 months. Etiology not certain. - Presented with atrial fibrillation with RVR.  Noted to have GI illness with fever a week prior to admit.  - Developed hypotension after he had been on diltiazem  gtt and beta blocker. Required addition of norepinephrine and milrinone.  - Echo 04/27/24: EF 25-30%, RV moderately reduced, moderate MR - Lactic acid up to 3.4>>cleared - Initial CO-OX 26%.  - Off norepinephrine this am. Now on 0.25 milrinone, initial CO-OX this am inaccurate. Repeat pending. - Received total of 3L fluid resuscitation. Would hold off on additional IVF. CVP 9.  - If Scr stable on labs this am will plan for [facility] today. Will need IV lasix but hold for now pending plan for day.  2. Atrial fibrillation with RVR - Single episode Afib in 2019 s/p DCCV - Presented with Afib with RVR 06/02, unknown when started. - Heparin gtt for now >> eventually convert - Load with IV amiodarone - TSH okay  3. CAD - Coronary calcium  score of 479 in LAD in 08/24 - Plan LHC as above - Continue rosuvastatin  20 daily - Check lipid panel  4. ID - Initially started on empiric abx with vanc + cefepime, will stop today as picture does not appear consistent with PNA or sepsis. More likely cardiogenic shock. - PCT 0.11 - BC X 2 pending.  5. AKI - Baseline Cr around 1, up to 1.77 early am in setting of shock - Continue inotrope support - Follow closely  6. Shock liver - Hepatic function panel this am - Monitor for improvement with addition of amiodarone  Length of Stay: 1  FINCH, LINDSAY N, PA-C  04/28/2024, 7:05 AM  Advanced Heart Failure Team Pager [phone] (M-F; 7a - 5p)  Please contact [facility] for night-coverage after hours (5p -7a ) and weekends on [email or web address]   Patient seen with PA, I formulated the plan and agree with the above note.   He remains in atrial fibrillation this morning, rate 90s.   SBP 100s-110s and now off NE.  He is on milrinone 0.25. Creatinine 1.77 last night. Co-ox  81% with CVP 8.   Echo yesterday was difficult/incomplete study, EF around 30%, moderate RV dysfunction, moderate MR.   General: NAD Neck: Thick, JVP 8-9 cm, no thyromegaly or thyroid  nodule.  Lungs: Clear to auscultation bilaterally with normal respiratory effort. CV: Nondisplaced PMI.  Heart irregular S1/S2, no S3/S4, no murmur.  No peripheral edema.   Abdomen: Soft, nontender, no hepatosplenomegaly, no distention.  Skin: Intact without lesions or rashes.  Neurologic: Alert and oriented x 3.  Psych: Normal affect. Extremities: No clubbing or cyanosis.  HEENT: Normal.   1. Atrial fibrillation: 1 prior episode of AF with DCCV.  Admitted with AF/RVR of uncertain duration.  Initially developed shock with diltiazem /metorprolol, now off diltiazem /metoprolol  and stable BP.  HR in 90s currently in AF.  - Start amiodarone gtt to help maintain NSR post-DCCV.  - Heparin gtt for now.  - Eventual TEE-guided DCCV.   - Will need atrial fibrillation ablation down the road.  2. Acute systolic CHF/cardiogenic shock: I reviewed yesterday's echo. Echo yesterday was difficult/incomplete study, EF around 30%, moderate RV dysfunction, moderate MR. Cause of cardiomyopathy uncertain.  Elevated calcium  score in past in LAD territory so has some degree of CAD.  HS-TnI normal this admission so no ACS.  Dyspnea present for 2-3 months, ?long-standing unrecognized AF/RVR with tachycardia-mediated CMP.  He developed shock in setting of diltiazem /metoprolol  for AF/RVR.  Now stable on milrinone 0.25, co-ox 81% with CVP 8.  - Continue milrinone 0.25 for now.  - Awaiting repeat BMET, will plan LHC/RHC today vs tomorrow depending on renal function to assess for significant coronary disease and for formal measurement of filling pressures/CO.  - Will give IV Lasix if repeat creatinine is trending down.  2. AKI: Creatinine up to 1.77 last  night in setting of shock, pending repeat.  3. Elevated LFTs: Suspect shock liver, will trend.  4. ID: WBCs 11.3, afebrile, PCT 0.11.  Think it unlikely that infection is driving his presentation.  - Can stop antibiotics.   CRITICAL CARE Performed by: [name]  Total critical care time: 40 minutes  Critical care time was exclusive of separately billable procedures and treating other patients.  Critical care was necessary to treat or prevent imminent or life-threatening deterioration.  Critical care was time spent personally by me on the following activities: development of treatment plan with patient and/or surrogate as well as nursing, discussions with consultants, evaluation of patient's response to treatment, examination of patient, obtaining history from patient or surrogate, ordering and performing treatments and interventions, ordering and review of laboratory studies, ordering and review of radiographic studies, pulse oximetry and re-evaluation of patient's condition.  [name] 04/28/2024 8:15 AM

## 2024-04-28 NOTE — Progress Notes (Signed)
 ANTICOAGULATION CONSULT NOTE  Pharmacy Consult for Eliquis  > Heparin Indication: atrial fibrillation  No Known Allergies  Patient Measurements: Height: 6\' 2"  (188 cm) Weight: 118.4 kg (261 lb 0.4 oz) IBW/kg (Calculated) : 82.2 Heparin Dosing Weight: 106 kg  Vital Signs: Temp: 98.5 F (36.9 C) (06/04 0400) Temp Source: Oral (06/04 0400) BP: 97/69 (06/04 0700) Pulse Rate: 65 (06/04 0700)  Labs: Recent Labs    04/26/24 1157 04/26/24 1424 04/26/24 2204 04/27/24 0504 04/27/24 0758 04/27/24 1509 04/28/24 0038  HGB 12.5*  --   --  12.9*  --  12.2* 10.6*  HCT 40.4  --   --  40.4  --  40.2 33.9*  PLT 183  --   --  168  --  176 143*  HEPARINUNFRC  --   --  0.20*  --  0.34  --   --   CREATININE 0.94  --   --  0.86  --  1.39* 1.77*  TROPONINIHS 14 12  --   --   --   --   --     Estimated Creatinine Clearance: 59.2 mL/min (A) (by C-G formula based on SCr of 1.77 mg/dL (H)).  Medical History: Past Medical History:  Diagnosis Date   Anxiety    Atrial fibrillation (HCC)    Depression    Hypertension    Paroxysmal atrial fibrillation (HCC)    cardioversion in ed 05/2018    Medications:  See MAR  Assessment: 63 yo male presents with AF RVR (noted hx PAF 2019 s/p DCCV - took Eliquis  x84mo then stopped w/ CHA2DsVASc 0).  Not on anticoagulation prior to admission.  Started on heparin at [facility] and transitioned to Eliquis  6/3.  Pharmacy consulted for heparin dosing while Eliquis  on hold for procedures  -Last Eliquis  dose 6/3 ~2200. -Hgb 10.6, pltc 143 -Previously therapeutic on heparin 1700 units/hr (HL 0.34).    Goal of Therapy:  Heparin level 0.3-0.7 units/ml aPTT 66-102 seconds Monitor platelets by anticoagulation protocol: Yes   Plan:  Start heparin infusion 1700 units/hr @1000  a 6 hour aPTT Daily CBC, heparin level, aPTT until correlating Monitor for s/sx of bleeding F/u after cath  Cecillia Cogan, PharmD Clinical Pharmacist 04/28/2024  7:53 AM

## 2024-04-28 NOTE — Telephone Encounter (Signed)
 Patient Product/process development scientist completed.    The patient is insured through [facility]. Patient has [employer], may use a copay card, and/or apply for patient assistance if available.    Ran test claim for Entresto 24-26 mg and the current 30 day co-pay is $70.00.  Ran test claim for Eliquis  5 mg and the current 30 day co-pay is $70.00.  Ran test claim for Xarelto 20 mg and the current 30 day co-pay is $70.00.  Ran test claim for Farxiga 10 mg and the current 30 day co-pay is $70.00.  Ran test claim for Jardiance 10 mg and the current 30 day co-pay is $70.00.  This test claim was processed through [facility]- copay amounts may vary at other pharmacies due to pharmacy/plan contracts, or as the patient moves through the different stages of their insurance plan.     Morgan Arab, CPHT Pharmacy Technician III Certified Patient Advocate [facility] Patient Advocate Team Direct Number: [phone]  Fax: [phone]

## 2024-04-28 NOTE — Progress Notes (Signed)
 ANTICOAGULATION CONSULT NOTE  Pharmacy Consult for Eliquis  > Heparin Indication: atrial fibrillation  No Known Allergies  Patient Measurements: Height: 6\' 2"  (188 cm) Weight: 118.4 kg (261 lb 0.4 oz) IBW/kg (Calculated) : 82.2 Heparin Dosing Weight: 106 kg  Vital Signs: Temp: 98 F (36.7 C) (06/04 1600) Temp Source: Oral (06/04 1600) BP: 144/117 (06/04 1808) Pulse Rate: 113 (06/04 1808)  Labs: Recent Labs    04/26/24 1157 04/26/24 1424 04/26/24 2204 04/27/24 0504 04/27/24 0758 04/27/24 1509 04/28/24 0038 04/28/24 0419 04/28/24 0550 04/28/24 1735  HGB 12.5*  --   --    < >  --  12.2* 10.6* 10.5*  --  10.2*  10.5*  HCT 40.4  --   --    < >  --  40.2 33.9* 33.4*  --  30.0*  31.0*  PLT 183  --   --    < >  --  176 143* 146*  --   --   HEPARINUNFRC  --   --  0.20*  --  0.34  --   --   --   --   --   CREATININE 0.94  --   --    < >  --  1.39* 1.77*  --  1.35*  --   TROPONINIHS 14 12  --   --   --   --   --   --   --   --    < > = values in this interval not displayed.    Estimated Creatinine Clearance: 77.6 mL/min (A) (by C-G formula based on SCr of 1.35 mg/dL (H)).  Medical History: Past Medical History:  Diagnosis Date   Anxiety    Atrial fibrillation (HCC)    Depression    Hypertension    Paroxysmal atrial fibrillation (HCC)    cardioversion in ed 05/2018    Medications:  See MAR  Assessment: 63 yo male presents with AF RVR (noted hx PAF 2019 s/p DCCV - took Eliquis  x28mo then stopped w/ CHA2DsVASc 0).  Not on anticoagulation prior to admission.  Started on heparin at [facility] and transitioned to Eliquis  6/3.  Pharmacy consulted for heparin dosing while Eliquis  on hold for procedures (LD apixaban  6/3@2200 ).   Underwent cardiac cath finding nonischemic cardiomyopathy in addition to elevated filling pressures and low PAPi. Plan to restart heparin infusion 2 hours after TR band removed. Was previously therapeutic on heparin infusion at 1700 units/hr - with  recent apixaban  will need to monitor both aPTT and heparin levels until correlate. No s/sx of bleeding.   Goal of Therapy:  Heparin level 0.3-0.7 units/ml aPTT 66-102 seconds Monitor platelets by anticoagulation protocol: Yes   Plan:  Start heparin infusion 1700 units/hr 2 hours after TR band is removed Will order heparin level/aPTT 6 hours after restart Daily CBC, heparin level, aPTT until correlating Monitor for s/sx of bleeding  Thank you for allowing pharmacy to participate in this patient's care,  Nieves Bars, PharmD, BCCCP Clinical Pharmacist  Phone: [phone] 04/28/2024 6:37 PM  Please check AMION for all [facility] phone numbers After 10:00 PM, call Main Pharmacy [phone]

## 2024-04-28 NOTE — Progress Notes (Addendum)
 Advanced Heart Failure Rounding Note  Cardiologist: Maudine Sos, MD   Chief Complaint: Cardiogenic shock  Subjective:    Off NE this am. Remains on 0.25 milrinone, CO-OX 81% (doubt this is accurate) >>> recheck pending.   CVP 9.   Remains in AF, ventricular rate 90s-110s. Tachycardic with light activity.  Feeling better this am. No dyspnea at rest or orthopnea.    Objective:   Weight Range: 118.4 kg Body mass index is 33.51 kg/m.   Vital Signs:   Temp:  [97.4 F (36.3 C)-98.5 F (36.9 C)] 98.5 F (36.9 C) (06/04 0400) Pulse Rate:  [38-118] 65 (06/04 0700) Resp:  [12-24] 14 (06/04 0700) BP: (62-136)/(42-92) 97/69 (06/04 0700) SpO2:  [85 %-100 %] 88 % (06/04 0700) Weight:  [118.4 kg] 118.4 kg (06/04 0500) Last BM Date :  (PTA)  Weight change: Filed Weights   04/26/24 1135 04/26/24 1528 04/28/24 0500  Weight: 113.9 kg 114.8 kg 118.4 kg    Intake/Output:   Intake/Output Summary (Last 24 hours) at 04/28/2024 0705 Last data filed at 04/28/2024 0700 Gross per 24 hour  Intake 3659.13 ml  Output 1065 ml  Net 2594.13 ml      Physical Exam    General:  No distress. Sitting up in bed. Neck: JVP 10 cm Cor: Irregularly irregular rhythm, tachy. No rubs, gallops or murmurs. Lungs: Clear Abdomen: obese, soft, nontender, mildly distended Extremities: 1+ edema, RUE PICC Neuro: Alert & orientedx3. Affect pleasant   Telemetry   Afib 90s-110s, tachycardic with light activity  EKG    Atrial fibrillation with ventricular rate 160s, poor R wave progression  Labs    CBC Recent Labs    04/27/24 1509 04/28/24 0038  WBC 7.2 11.3*  HGB 12.2* 10.6*  HCT 40.2 33.9*  MCV 90.7 87.6  PLT 176 143*   Basic Metabolic Panel Recent Labs    40/98/11 1157 04/27/24 0504 04/27/24 1509 04/28/24 0038  NA 139 136 134* 131*  K 3.9 3.4* 4.0 4.2  CL 104 104 106 102  CO2 24 24 18* 19*  GLUCOSE 90 101* 139* 239*  BUN 11 13 14 18   CREATININE 0.94 0.86 1.39* 1.77*   CALCIUM  8.7* 8.9 8.2* 8.1*  MG 2.1 2.3  --   --    Liver Function Tests Recent Labs    04/28/24 0038  AST 1,113*  ALT 892*  ALKPHOS 37*  BILITOT 1.5*  PROT 5.8*  ALBUMIN 3.0*   No results for input(s): "LIPASE", "AMYLASE" in the last 72 hours. Cardiac Enzymes No results for input(s): "CKTOTAL", "CKMB", "CKMBINDEX", "TROPONINI" in the last 72 hours.  BNP: BNP (last 3 results) Recent Labs    04/26/24 1157  BNP 367.0*    ProBNP (last 3 results) No results for input(s): "PROBNP" in the last 8760 hours.   D-Dimer No results for input(s): "DDIMER" in the last 72 hours. Hemoglobin A1C No results for input(s): "HGBA1C" in the last 72 hours. Fasting Lipid Panel No results for input(s): "CHOL", "HDL", "LDLCALC", "TRIG", "CHOLHDL", "LDLDIRECT" in the last 72 hours. Thyroid  Function Tests Recent Labs    04/27/24 0504  TSH 1.558    Other results:   Imaging    DG CHEST PORT 1 VIEW Result Date: 04/27/2024 CLINICAL DATA:  PICC line placement. EXAM: PORTABLE CHEST 1 VIEW COMPARISON:  Radiographs 04/26/2024 and 06/09/2018. Cardiac CT 07/18/2023. FINDINGS: 1753 hours. There is a faintly visible right arm PICC which projects to the mid SVC level. No evidence of pneumothorax or  significant pleural effusion. The lungs remain clear. Stable borderline cardiac enlargement. No acute osseous findings are seen. IMPRESSION: Right arm PICC projects to the mid SVC level. No evidence of pneumothorax or other acute cardiopulmonary process. Electronically Signed   By: Elmon Hagedorn M.D.   On: 04/27/2024 18:21   ECHOCARDIOGRAM COMPLETE Result Date: 04/27/2024    ECHOCARDIOGRAM REPORT   Patient Name:   Jason Fleming Date of Exam: 04/27/2024 Medical Rec #:  [id number]      Height:       74.0 in Accession #:    [id number]     Weight:       253.1 lb Date of Birth:  1961/09/24      BSA:          2.402 m Patient Age:    63 years       BP:           90/71 mmHg Patient Gender: M              HR:            67 bpm. Exam Location:  [facility] Procedure: 2D Echo, Cardiac Doppler and Color Doppler (Both Spectral and Color            Flow Doppler were utilized during procedure). Indications:    A-fib  History:        Patient has prior history of Echocardiogram examinations, most                 recent 08/11/2018. Arrythmias:Atrial Fibrillation; Risk                 Factors:Hypertension.  Sonographer:    Jeralene Mom Referring Phys: [phone] DAVID TAT IMPRESSIONS  1. Left ventricular ejection fraction, by estimation, is 25 to 30%. The left ventricle has moderately decreased function. Left ventricular endocardial border not optimally defined to evaluate regional wall motion. Left ventricular diastolic parameters are indeterminate.  2. RV not well visualized. Appears enlarged with roughly moderate systolic dysfunction. Indeterminate PASP, IVC not visualized. . Right ventricular systolic function was not well visualized. The right ventricular size is not well visualized.  3. The mitral valve is abnormal. Moderate mitral valve regurgitation. No evidence of mitral stenosis.  4. The aortic valve is tricuspid. Aortic valve regurgitation is not visualized. No aortic stenosis is present.  5. Limited study, patient uncomfortable with positioning and asked to end study. FINDINGS  Left Ventricle: Left ventricular ejection fraction, by estimation, is 25 to 30%. The left ventricle has moderately decreased function. Left ventricular endocardial border not optimally defined to evaluate regional wall motion. The left ventricular internal cavity size was normal in size. There is no left ventricular hypertrophy. Left ventricular diastolic parameters are indeterminate. Right Ventricle: RV not well visualized. Appears enlarged with roughly moderate systolic dysfunction. Indeterminate PASP, IVC not visualized. The right ventricular size is not well visualized. Right vetricular wall thickness was not well visualized. Right ventricular systolic  function was not well visualized. Left Atrium: Left atrial size was normal in size. Right Atrium: Right atrial size was not well visualized. Pericardium: There is no evidence of pericardial effusion. Mitral Valve: The mitral valve is abnormal. Moderate mitral valve regurgitation. No evidence of mitral valve stenosis. MV peak gradient, 3.6 mmHg. The mean mitral valve gradient is 1.0 mmHg. Tricuspid Valve: The tricuspid valve is not well visualized. Tricuspid valve regurgitation is mild . No evidence of tricuspid stenosis. Aortic Valve: The aortic valve is tricuspid. Aortic valve  regurgitation is not visualized. No aortic stenosis is present. Aortic valve mean gradient measures 1.0 mmHg. Aortic valve peak gradient measures 2.7 mmHg. Aortic valve area, by VTI measures 2.50 cm. Pulmonic Valve: The pulmonic valve was not well visualized. Pulmonic valve regurgitation is not visualized. No evidence of pulmonic stenosis. Aorta: The aortic root and ascending aorta are structurally normal, with no evidence of dilitation.  LEFT VENTRICLE PLAX 2D LVIDd:         5.85 cm      Diastology LVIDs:         4.60 cm      LV e' medial:    12.60 cm/s LV PW:         1.00 cm      LV E/e' medial:  7.3 LV IVS:        1.00 cm      LV e' lateral:   15.60 cm/s LVOT diam:     2.10 cm      LV E/e' lateral: 5.9 LV SV:         39 LV SV Index:   16 LVOT Area:     3.46 cm  LV Volumes (MOD) LV vol d, MOD A4C: 115.0 ml LV vol s, MOD A4C: 58.2 ml LV SV MOD A4C:     115.0 ml RIGHT VENTRICLE RV Basal diam:  4.40 cm RV Mid diam:    3.20 cm RV S prime:     9.03 cm/s LEFT ATRIUM           Index        RIGHT ATRIUM           Index LA diam:      4.60 cm 1.91 cm/m   RA Area:     35.60 cm LA Vol (A4C): 61.7 ml 25.68 ml/m  RA Volume:   134.00 ml 55.78 ml/m  AORTIC VALVE AV Area (Vmax):    2.28 cm AV Area (Vmean):   2.16 cm AV Area (VTI):     2.50 cm AV Vmax:           81.40 cm/s AV Vmean:          54.300 cm/s AV VTI:            0.156 m AV Peak Grad:       2.7 mmHg AV Mean Grad:      1.0 mmHg LVOT Vmax:         53.55 cm/s LVOT Vmean:        33.850 cm/s LVOT VTI:          0.112 m LVOT/AV VTI ratio: 0.72  AORTA Ao Root diam: 3.40 cm Ao Asc diam:  3.40 cm MITRAL VALVE               TRICUSPID VALVE MV Area (PHT): 5.02 cm    TR Peak grad:   12.8 mmHg MV Area VTI:   1.30 cm    TR Vmax:        179.00 cm/s MV Peak grad:  3.6 mmHg MV Mean grad:  1.0 mmHg    SHUNTS MV Vmax:       0.95 m/s    Systemic VTI:  0.11 m MV Vmean:      50.1 cm/s   Systemic Diam: 2.10 cm MV Decel Time: 151 msec MR Peak grad: 51.3 mmHg MR Vmax:      358.00 cm/s MV E velocity: 91.70 cm/s MV A velocity: 40.30 cm/s MV E/A ratio:  2.28 Armida Lander MD Electronically signed by Armida Lander MD Signature Date/Time: 04/27/2024/4:19:51 PM    Final    US  EKG SITE RITE Result Date: 04/27/2024 If Site Rite image not attached, placement could not be confirmed due to current cardiac rhythm.    Medications:     Scheduled Medications:  apixaban   5 mg Oral BID   aspirin  EC  81 mg Oral Daily   Chlorhexidine  Gluconate Cloth  6 each Topical Q0600   rosuvastatin   20 mg Oral Daily   sodium chloride  flush  10-40 mL Intracatheter Q12H   venlafaxine  XR  150 mg Oral Q breakfast    Infusions:  sodium chloride  Stopped (04/27/24 1804)   ceFEPime (MAXIPIME) IV Stopped (04/28/24 0110)   milrinone 0.25 mcg/kg/min (04/28/24 0700)   norepinephrine (LEVOPHED) Adult infusion Stopped (04/28/24 0414)   vancomycin      PRN Medications: acetaminophen , ondansetron **OR** ondansetron (ZOFRAN) IV, mouth rinse, sodium chloride  flush    Patient Profile   63 y.o. male with history of atrial fibrillation 2019, HTN, HLD, elevated coronary calcium  score, prior tobacco use.   Presented with atrial fibrillation with RVR and acute on chronic CHF. Transferred to [facility] after progressing to cardiogenic shock.  Assessment/Plan   Cardiogenic shock Acute on chronic systolic CHF - Suspect he's had LV dysfunction for  some time. Reports dyspnea with exertion with decline in functional capacity over the last 3 months. Etiology not certain. - Presented with atrial fibrillation with RVR.  Noted to have GI illness with fever a week prior to admit.  - Developed hypotension after he had been on diltiazem  gtt and beta blocker. Required addition of norepinephrine and milrinone.  - Echo 04/27/24: EF 25-30%, RV moderately reduced, moderate MR - Lactic acid up to 3.4>>cleared - Initial CO-OX 26%.  - Off norepinephrine this am. Now on 0.25 milrinone, initial CO-OX this am inaccurate. Repeat pending. - Received total of 3L fluid resuscitation. Would hold off on additional IVF. CVP 9.  - If Scr stable on labs this am will plan for [facility] today. Will need IV lasix but hold for now pending plan for day.  2. Atrial fibrillation with RVR - Single episode Afib in 2019 s/p DCCV - Presented with Afib with RVR 06/02, unknown when started. - Heparin gtt for now >> eventually convert - Load with IV amiodarone - TSH okay  3. CAD - Coronary calcium  score of 479 in LAD in 08/24 - Plan LHC as above - Continue rosuvastatin  20 daily - Check lipid panel  4. ID - Initially started on empiric abx with vanc + cefepime, will stop today as picture does not appear consistent with PNA or sepsis. More likely cardiogenic shock. - PCT 0.11 - BC X 2 pending.  5. AKI - Baseline Cr around 1, up to 1.77 early am in setting of shock - Continue inotrope support - Follow closely  6. Shock liver - Hepatic function panel this am - Monitor for improvement with addition of amiodarone  Length of Stay: 1  FINCH, LINDSAY N, PA-C  04/28/2024, 7:05 AM  Advanced Heart Failure Team Pager [phone] (M-F; 7a - 5p)  Please contact [facility] for night-coverage after hours (5p -7a ) and weekends on [email or web address]   Patient seen with PA, I formulated the plan and agree with the above note.   He remains in atrial fibrillation this morning, rate 90s.   SBP 100s-110s and now off NE.  He is on milrinone 0.25. Creatinine 1.77 last night. Co-ox  81% with CVP 8.   Echo yesterday was difficult/incomplete study, EF around 30%, moderate RV dysfunction, moderate MR.   General: NAD Neck: Thick, JVP 8-9 cm, no thyromegaly or thyroid  nodule.  Lungs: Clear to auscultation bilaterally with normal respiratory effort. CV: Nondisplaced PMI.  Heart irregular S1/S2, no S3/S4, no murmur.  No peripheral edema.   Abdomen: Soft, nontender, no hepatosplenomegaly, no distention.  Skin: Intact without lesions or rashes.  Neurologic: Alert and oriented x 3.  Psych: Normal affect. Extremities: No clubbing or cyanosis.  HEENT: Normal.   1. Atrial fibrillation: 1 prior episode of AF with DCCV.  Admitted with AF/RVR of uncertain duration.  Initially developed shock with diltiazem /metorprolol, now off diltiazem /metoprolol  and stable BP.  HR in 90s currently in AF.  - Start amiodarone gtt to help maintain NSR post-DCCV.  - Heparin gtt for now.  - Eventual TEE-guided DCCV.   - Will need atrial fibrillation ablation down the road.  2. Acute systolic CHF/cardiogenic shock: I reviewed yesterday's echo. Echo yesterday was difficult/incomplete study, EF around 30%, moderate RV dysfunction, moderate MR. Cause of cardiomyopathy uncertain.  Elevated calcium  score in past in LAD territory so has some degree of CAD.  HS-TnI normal this admission so no ACS.  Dyspnea present for 2-3 months, ?long-standing unrecognized AF/RVR with tachycardia-mediated CMP.  He developed shock in setting of diltiazem /metoprolol  for AF/RVR.  Now stable on milrinone 0.25, co-ox 81% with CVP 8.  - Continue milrinone 0.25 for now.  - Awaiting repeat BMET, will plan LHC/RHC today vs tomorrow depending on renal function to assess for significant coronary disease and for formal measurement of filling pressures/CO.  - Will give IV Lasix if repeat creatinine is trending down.  2. AKI: Creatinine up to 1.77 last  night in setting of shock, pending repeat.  3. Elevated LFTs: Suspect shock liver, will trend.  4. ID: WBCs 11.3, afebrile, PCT 0.11.  Think it unlikely that infection is driving his presentation.  - Can stop antibiotics.   CRITICAL CARE Performed by: [name]  Total critical care time: 40 minutes  Critical care time was exclusive of separately billable procedures and treating other patients.  Critical care was necessary to treat or prevent imminent or life-threatening deterioration.  Critical care was time spent personally by me on the following activities: development of treatment plan with patient and/or surrogate as well as nursing, discussions with consultants, evaluation of patient's response to treatment, examination of patient, obtaining history from patient or surrogate, ordering and performing treatments and interventions, ordering and review of laboratory studies, ordering and review of radiographic studies, pulse oximetry and re-evaluation of patient's condition.  [name] 04/28/2024 8:15 AM

## 2024-04-29 ENCOUNTER — Inpatient Hospital Stay (HOSPITAL_COMMUNITY)

## 2024-04-29 ENCOUNTER — Encounter (HOSPITAL_COMMUNITY): Payer: Self-pay | Admitting: Cardiology

## 2024-04-29 ENCOUNTER — Encounter (HOSPITAL_COMMUNITY)
Admission: EM | Disposition: A | Discharge status: Home / Self Care | Payer: Self-pay | Source: Home / Self Care | Attending: Student in an Organized Health Care Education/Training Program

## 2024-04-29 ENCOUNTER — Inpatient Hospital Stay (HOSPITAL_COMMUNITY)
Admit: 2024-04-29 | Discharge: 2024-04-29 | Disposition: A | Discharge status: Home / Self Care | Attending: Cardiology | Admitting: Cardiology

## 2024-04-29 DIAGNOSIS — I4891 Unspecified atrial fibrillation: Secondary | ICD-10-CM

## 2024-04-29 DIAGNOSIS — I340 Nonrheumatic mitral (valve) insufficiency: Secondary | ICD-10-CM

## 2024-04-29 HISTORY — PX: TRANSESOPHAGEAL ECHOCARDIOGRAM (CATH LAB): EP1270

## 2024-04-29 HISTORY — PX: CARDIOVERSION: EP1203

## 2024-04-29 LAB — BASIC METABOLIC PANEL WITH GFR
Anion gap: 10 (ref 5–15)
Anion gap: 7 (ref 5–15)
BUN: 16 mg/dL (ref 8–23)
BUN: 16 mg/dL (ref 8–23)
CO2: 21 mmol/L — ABNORMAL LOW (ref 22–32)
CO2: 25 mmol/L (ref 22–32)
Calcium: 7.3 mg/dL — ABNORMAL LOW (ref 8.9–10.3)
Calcium: 8.1 mg/dL — ABNORMAL LOW (ref 8.9–10.3)
Chloride: 105 mmol/L (ref 98–111)
Chloride: 104 mmol/L (ref 98–111)
Creatinine, Ser: 1.13 mg/dL (ref 0.61–1.24)
Creatinine, Ser: 1.22 mg/dL (ref 0.61–1.24)
GFR, Estimated: >60 mL/min (ref >60)
GFR, Estimated: >60 mL/min (ref >60)
Glucose, Bld: 141 mg/dL — ABNORMAL HIGH (ref 70–99)
Glucose, Bld: 159 mg/dL — ABNORMAL HIGH (ref 70–99)
Potassium: 3.4 mmol/L — ABNORMAL LOW (ref 3.5–5.1)
Potassium: 3.9 mmol/L (ref 3.5–5.1)
Sodium: 136 mmol/L (ref 135–145)
Sodium: 136 mmol/L (ref 135–145)

## 2024-04-29 LAB — CBC
HCT: 34 % — ABNORMAL LOW (ref 39.0–52.0)
Hemoglobin: 10.7 g/dL — ABNORMAL LOW (ref 13.0–17.0)
MCH: 27.5 pg (ref 26.0–34.0)
MCHC: 31.5 g/dL (ref 30.0–36.0)
MCV: 87.4 fL (ref 80.0–100.0)
Platelets: 154 10*3/uL (ref 150–400)
RBC: 3.89 MIL/uL — ABNORMAL LOW (ref 4.22–5.81)
RDW: 14.6 % (ref 11.5–15.5)
WBC: 8.3 10*3/uL (ref 4.0–10.5)
nRBC: 0 % (ref 0.0–0.2)

## 2024-04-29 LAB — HEPATIC FUNCTION PANEL
ALT: 1751 U/L — ABNORMAL HIGH (ref 0–44)
AST: 1726 U/L — ABNORMAL HIGH (ref 15–41)
Albumin: 3.4 g/dL — ABNORMAL LOW (ref 3.5–5.0)
Alkaline Phosphatase: 49 U/L (ref 38–126)
Bilirubin, Direct: 0.3 mg/dL — ABNORMAL HIGH (ref 0.0–0.2)
Indirect Bilirubin: 0.7 mg/dL (ref 0.3–0.9)
Total Bilirubin: 1 mg/dL (ref 0.0–1.2)
Total Protein: 6.6 g/dL (ref 6.5–8.1)

## 2024-04-29 LAB — HEPARIN LEVEL (UNFRACTIONATED): Heparin Unfractionated: 0.77 [IU]/mL — ABNORMAL HIGH (ref 0.30–0.70)

## 2024-04-29 LAB — COOXEMETRY PANEL
Carboxyhemoglobin: 1.2 % (ref 0.5–1.5)
Methemoglobin: <0.7 % (ref 0.0–1.5)
O2 Saturation: 71.1 %
Total hemoglobin: 11.2 g/dL — ABNORMAL LOW (ref 12.0–16.0)

## 2024-04-29 LAB — MAGNESIUM
Magnesium: 3.1 mg/dL — ABNORMAL HIGH (ref 1.7–2.4)
Magnesium: 2.1 mg/dL (ref 1.7–2.4)

## 2024-04-29 LAB — APTT: aPTT: 84 s — ABNORMAL HIGH (ref 24–36)

## 2024-04-29 SURGERY — TRANSESOPHAGEAL ECHOCARDIOGRAM (TEE) (CATHLAB)
Anesthesia: Monitor Anesthesia Care

## 2024-04-29 MED ORDER — POTASSIUM CHLORIDE CRYS ER 20 MEQ PO TBCR
40.0000 meq | EXTENDED_RELEASE_TABLET | Freq: Once | ORAL | Status: AC
  Administered 2024-04-29: 40 meq via ORAL
  Filled 2024-04-29: qty 2

## 2024-04-29 MED ORDER — PHENYLEPHRINE HCL-NACL 20-0.9 MG/250ML-% IV SOLN
INTRAVENOUS | Status: DC | PRN
  Administered 2024-04-29: 25 ug/min via INTRAVENOUS

## 2024-04-29 MED ORDER — FUROSEMIDE 10 MG/ML IJ SOLN
80.0000 mg | Freq: Two times a day (BID) | INTRAMUSCULAR | Status: DC
  Administered 2024-04-29 – 2024-04-30 (×2): 80 mg via INTRAVENOUS
  Filled 2024-04-29 (×2): qty 8

## 2024-04-29 MED ORDER — SODIUM CHLORIDE 0.9 % IV SOLN
INTRAVENOUS | Status: DC | PRN
Start: 2024-04-29 — End: 2024-04-29

## 2024-04-29 MED ORDER — SPIRONOLACTONE 12.5 MG HALF TABLET
12.5000 mg | ORAL_TABLET | Freq: Every day | ORAL | Status: DC
  Administered 2024-04-29: 12.5 mg via ORAL
  Filled 2024-04-29: qty 1

## 2024-04-29 MED ORDER — SODIUM CHLORIDE 0.9 % IV SOLN: INTRAVENOUS | Status: DC

## 2024-04-29 MED ORDER — LOSARTAN POTASSIUM 25 MG PO TABS
12.5000 mg | ORAL_TABLET | Freq: Every day | ORAL | Status: DC
  Administered 2024-04-29: 12.5 mg via ORAL
  Filled 2024-04-29: qty 1

## 2024-04-29 MED ORDER — DIGOXIN 125 MCG PO TABS
0.1250 mg | ORAL_TABLET | Freq: Every day | ORAL | Status: DC
  Administered 2024-04-29 – 2024-05-03 (×5): 0.125 mg via ORAL
  Filled 2024-04-29 (×5): qty 1

## 2024-04-29 MED ORDER — PROPOFOL 500 MG/50ML IV EMUL
INTRAVENOUS | Status: DC | PRN
Start: 2024-04-29 — End: 2024-04-29
  Administered 2024-04-29: 120 ug/kg/min via INTRAVENOUS
  Administered 2024-04-29: 25 mg via INTRAVENOUS

## 2024-04-29 SURGICAL SUPPLY — 1 items: PAD DEFIB RADIO PHYSIO CONN (PAD) ×2 IMPLANT

## 2024-04-29 NOTE — Progress Notes (Signed)
 Patient ID: Jason Fleming, male   DOB: 17-Oct-1961, 63 y.o.   MRN: [id number]     Advanced Heart Failure Rounding Note  Cardiologist: Maudine Sos, MD   Chief Complaint: Cardiogenic shock  Subjective:    On milrinone 0.25, co-ox 71% this morning.  CVP 11-12, good diuresis with IV  He remains in AF with rate 100s on amiodarone gtt 30 mg/hr + heparin gtt.   LHC/RHC: Diagnostic Dominance: Right Left Anterior Descending  Prox LAD to Mid LAD lesion is 30% stenosed.    Intervention   No interventions have been documented.   Right Heart  Right Heart Pressures RHC Procedural Findings (on milrinone 0.25): Hemodynamics (mmHg) RA mean 16 RV 36/15 PA 43/23, mean 30 PCWP mean 23 LV 109/22 AO 103/83  Oxygen saturations: PA 64% AO 93%  Cardiac Output (Fick) 7.8  Cardiac Index (Fick) 3.21  PVR 0.9 WU  Cardiac Output (Thermo) 6.82  Cardiac Index (Thermo) 2.81  PVR 1.0 WU  PAPi 1.25     Objective:   Weight Range: 114.6 kg Body mass index is 32.44 kg/m.   Vital Signs:   Temp:  [97.9 F (36.6 C)-98.6 F (37 C)] 98.3 F (36.8 C) (06/05 0752) Pulse Rate:  [61-155] 103 (06/05 0700) Resp:  [11-28] 11 (06/05 0700) BP: (89-163)/(61-139) 112/84 (06/05 0700) SpO2:  [89 %-97 %] 95 % (06/05 0751) FiO2 (%):  [2 %] 2 % (06/04 1830) Weight:  [114.6 kg] 114.6 kg (06/05 0500) Last BM Date : 04/27/24  Weight change: Filed Weights   04/26/24 1528 04/28/24 0500 04/29/24 0500  Weight: 114.8 kg 118.4 kg 114.6 kg    Intake/Output:   Intake/Output Summary (Last 24 hours) at 04/29/2024 0825 Last data filed at 04/29/2024 0700 Gross per 24 hour  Intake 803.51 ml  Output 3700 ml  Net -2896.49 ml      Physical Exam    General: NAD Neck: JVP 10-12 cm, no thyromegaly or thyroid  nodule.  Lungs: Clear to auscultation bilaterally with normal respiratory effort. CV: Nondisplaced PMI.  Heart tachy, irregular S1/S2, no S3/S4, no murmur.  No peripheral edema.   Abdomen: Soft,  nontender, no hepatosplenomegaly, no distention.  Skin: Intact without lesions or rashes.  Neurologic: Alert and oriented x 3.  Psych: Normal affect. Extremities: No clubbing or cyanosis.  HEENT: Normal.   Telemetry   Afib 100s, personally reviewed  Labs    CBC Recent Labs    04/28/24 0419 04/28/24 1735 04/29/24 0514  WBC 9.4  --  8.3  HGB 10.5* 10.2*  10.5* 10.7*  HCT 33.4* 30.0*  31.0* 34.0*  MCV 86.8  --  87.4  PLT 146*  --  154   Basic Metabolic Panel Recent Labs    40/98/11 0414 04/29/24 0514  NA 136 136  K 3.4* 3.9  CL 105 104  CO2 21* 25  GLUCOSE 141* 159*  BUN 16 16  CREATININE 1.13 1.22  CALCIUM  7.3* 8.1*  MG 3.1* 2.1   Liver Function Tests Recent Labs    04/28/24 0038 04/28/24 0550  AST 1,113* 1,294*  ALT 892* 873*  ALKPHOS 37* 28*  BILITOT 1.5* 1.0  PROT 5.8* 4.5*  ALBUMIN 3.0* 2.3*   No results for input(s): "LIPASE", "AMYLASE" in the last 72 hours. Cardiac Enzymes No results for input(s): "CKTOTAL", "CKMB", "CKMBINDEX", "TROPONINI" in the last 72 hours.  BNP: BNP (last 3 results) Recent Labs    04/26/24 1157  BNP 367.0*    ProBNP (last 3 results) No results  for input(s): "PROBNP" in the last 8760 hours.   D-Dimer No results for input(s): "DDIMER" in the last 72 hours. Hemoglobin A1C Recent Labs    04/28/24 0419  HGBA1C 6.3*   Fasting Lipid Panel Recent Labs    04/28/24 0547  CHOL 56  HDL 19*  LDLCALC 26  TRIG 57  CHOLHDL 2.9   Thyroid  Function Tests Recent Labs    04/27/24 0504  TSH 1.558    Other results:   Imaging    CARDIAC CATHETERIZATION Result Date: 04/28/2024 1.  Mild nonobstructive CAD.  Nonischemic cardiomyopathy. 2. Elevated filling pressures. 3. Preserved cardiac output but low PAPi suggests RV dysfunction. 4. Mild pulmonary venous hypertension.     Medications:     Scheduled Medications:  aspirin  EC  81 mg Oral Daily   Chlorhexidine  Gluconate Cloth  6 each Topical Q0600   digoxin   0.125 mg Oral Daily   furosemide  60 mg Intravenous BID   losartan  12.5 mg Oral Daily   rosuvastatin   20 mg Oral Daily   sodium chloride  flush  10-40 mL Intracatheter Q12H   sodium chloride  flush  3 mL Intravenous Q12H   spironolactone  12.5 mg Oral Daily   venlafaxine  XR  150 mg Oral Q breakfast    Infusions:  sodium chloride      amiodarone 30 mg/hr (04/29/24 0430)   heparin 1,700 Units/hr (04/29/24 0430)   milrinone 0.125 mcg/kg/min (04/29/24 0733)   norepinephrine (LEVOPHED) Adult infusion Stopped (04/28/24 0414)    PRN Medications: sodium chloride , acetaminophen , ondansetron (ZOFRAN) IV, mouth rinse, sodium chloride  flush, sodium chloride  flush    Patient Profile   63 y.o. male with history of atrial fibrillation 2019, HTN, HLD, elevated coronary calcium  score, prior tobacco use.   Presented with atrial fibrillation with RVR and acute on chronic CHF. Transferred to [facility] after progressing to cardiogenic shock.  Assessment/Plan   1. Atrial fibrillation: 1 prior episode of AF with DCCV.  Admitted with AF/RVR of uncertain duration.  Initially developed shock with diltiazem /metorprolol, now off diltiazem /metoprolol  and stable BP.  HR in 100s currently in AF.  - Continue amiodarone gtt.  - Heparin gtt for now.  - TEE-guided DCCV today, discussed risks/benefits with patient and he agrees to procedure.  - Will need atrial fibrillation ablation down the road.  2. Acute systolic CHF/cardiogenic shock:  Nonischemic cardiomyopathy.  Echo this admission was difficult/incomplete study, EF around 30%, moderate RV dysfunction, moderate MR. Cause of cardiomyopathy uncertain.  Cath showed mild nonobstructive CAD.  Dyspnea present for 2-3 months, ?long-standing unrecognized AF/RVR with tachycardia-mediated CMP.  He developed shock in setting of diltiazem /metoprolol  for AF/RVR.  Now stable on milrinone 0.25, co-ox 71% with CVP 11-12. Good diuresis with IV Lasix yesterday.  - Wean off milrinone  this morning pre-DCCV.  - Continue Lasix 80 mg IV bid today and replace K.  - Digoxin 0.125 daily - Add spironolactone 12.5 daily - Add losartan 12.5 daily.  - Cardiac MRI when back in NSR.  2. AKI: Creatinine up to 1.77 initially, now 1.22.   3. Elevated LFTs: Suspect shock liver, will trend.  4. ID: WBCs 11.3, afebrile, PCT 0.11.  Think it unlikely that infection is driving his presentation.  - stopped antibiotics.   CRITICAL CARE Performed by: [name]  Total critical care time: 35 minutes  Critical care time was exclusive of separately billable procedures and treating other patients.  Critical care was necessary to treat or prevent imminent or life-threatening deterioration.  Critical  care was time spent personally by me on the following activities: development of treatment plan with patient and/or surrogate as well as nursing, discussions with consultants, evaluation of patient's response to treatment, examination of patient, obtaining history from patient or surrogate, ordering and performing treatments and interventions, ordering and review of laboratory studies, ordering and review of radiographic studies, pulse oximetry and re-evaluation of patient's condition.  [name] 04/29/2024 8:25 AM

## 2024-04-29 NOTE — Progress Notes (Signed)
 ANTICOAGULATION CONSULT NOTE  Pharmacy Consult for Eliquis  > Heparin Indication: atrial fibrillation  No Known Allergies  Patient Measurements: Height: 6\' 2"  (188 cm) Weight: 114.6 kg (252 lb 10.4 oz) IBW/kg (Calculated) : 82.2 Heparin Dosing Weight: 106 kg  Vital Signs: Temp: 98.3 F (36.8 C) (06/05 1145) Temp Source: Oral (06/05 1145) BP: 117/97 (06/05 1200) Pulse Rate: 114 (06/05 1200)  Labs: Recent Labs    04/26/24 1424 04/26/24 2204 04/27/24 0504 04/27/24 0758 04/27/24 1509 04/28/24 0038 04/28/24 0419 04/28/24 0550 04/28/24 1735 04/29/24 0414 04/29/24 0514 04/29/24 1134  HGB  --   --    < >  --    < > 10.6* 10.5*  --  10.2*  10.5*  --  10.7*  --   HCT  --   --    < >  --    < > 33.9* 33.4*  --  30.0*  31.0*  --  34.0*  --   PLT  --   --    < >  --    < > 143* 146*  --   --   --  154  --   APTT  --   --   --   --   --   --   --   --   --   --   --  84*  HEPARINUNFRC  --  0.20*  --  0.34  --   --   --   --   --   --   --  0.77*  CREATININE  --   --    < >  --    < > 1.77*  --  1.35*  --  1.13 1.22  --   TROPONINIHS 12  --   --   --   --   --   --   --   --   --   --   --    < > = values in this interval not displayed.    Estimated Creatinine Clearance: 84.5 mL/min (by C-G formula based on SCr of 1.22 mg/dL).  Medical History: Past Medical History:  Diagnosis Date   Anxiety    Atrial fibrillation (HCC)    Depression    Hypertension    Paroxysmal atrial fibrillation (HCC)    cardioversion in ed 05/2018    Medications:  See MAR  Assessment: 63 yo male presents with AF RVR (noted hx PAF 2019 s/p DCCV - took Eliquis  x39mo then stopped w/ CHA2DsVASc 0).  Not on anticoagulation prior to admission.  Started on heparin at [facility] and transitioned to Eliquis  6/3.  Pharmacy consulted for heparin dosing while Eliquis  on hold for procedures (LD apixaban  6/3@2200 ).   S/p LHC finding nonischemic cardiomyopathy in addition to elevated filling pressures and low  PAPi.  Unsuccessful DCCV this morning. -aPTT 84 sec, therapeutic (heparin level 0.77 still impacted by recent Eliquis  6/3 PM) -CBC stable  Goal of Therapy:  Heparin level 0.3-0.7 units/ml aPTT 66-102 seconds Monitor platelets by anticoagulation protocol: Yes   Plan:  Heparin IV 1700 units/hr  Daily CBC, heparin level, aPTT until correlating Monitor for s/sx of bleeding  Thank you for allowing pharmacy to participate in this patient's care,  Cecillia Cogan, PharmD Clinical Pharmacist 04/29/2024  12:41 PM

## 2024-04-29 NOTE — CV Procedure (Signed)
 Procedure:  TEE  Sedation: Per anesthesiology  Findings: Please see echo section for full report.  Normal LV size with EF 25-30%, diffuse hypokinesis.  Normal RV size with mild systolic dysfunction.  Mild right atrial enlargement, no PFO/ASD by color doppler.  Mild left atrial enlargement, no LA appendage thrombus.  Trileaflet aortic valve with trivial AI, no AS.  Trivial TR.  There were two jets of mitral regurgitation, overall mitral regurgitation was likely moderate.  Suspect functional related to atrial fibrillation.  PISA ERO of the combined jets only calculated to 0.17 cm^2.  Normal caliber thoracic aorta with minimal plaque.   May proceed to DCCV.   Jason Fleming 04/29/2024 9:48 AM

## 2024-04-29 NOTE — H&P (View-Only) (Signed)
 Patient ID: Jason Fleming, male   DOB: Dec 03, 1960, 63 y.o.   MRN: [id number]     Advanced Heart Failure Rounding Note  Cardiologist: Maudine Sos, MD   Chief Complaint: Cardiogenic shock  Subjective:    On milrinone 0.25, co-ox 71% this morning.  CVP 11-12, good diuresis with IV  He remains in AF with rate 100s on amiodarone gtt 30 mg/hr + heparin gtt.   LHC/RHC: Diagnostic Dominance: Right Left Anterior Descending  Prox LAD to Mid LAD lesion is 30% stenosed.    Intervention   No interventions have been documented.   Right Heart  Right Heart Pressures RHC Procedural Findings (on milrinone 0.25): Hemodynamics (mmHg) RA mean 16 RV 36/15 PA 43/23, mean 30 PCWP mean 23 LV 109/22 AO 103/83  Oxygen saturations: PA 64% AO 93%  Cardiac Output (Fick) 7.8  Cardiac Index (Fick) 3.21  PVR 0.9 WU  Cardiac Output (Thermo) 6.82  Cardiac Index (Thermo) 2.81  PVR 1.0 WU  PAPi 1.25     Objective:   Weight Range: 114.6 kg Body mass index is 32.44 kg/m.   Vital Signs:   Temp:  [97.9 F (36.6 C)-98.6 F (37 C)] 98.3 F (36.8 C) (06/05 0752) Pulse Rate:  [61-155] 103 (06/05 0700) Resp:  [11-28] 11 (06/05 0700) BP: (89-163)/(61-139) 112/84 (06/05 0700) SpO2:  [89 %-97 %] 95 % (06/05 0751) FiO2 (%):  [2 %] 2 % (06/04 1830) Weight:  [114.6 kg] 114.6 kg (06/05 0500) Last BM Date : 04/27/24  Weight change: Filed Weights   04/26/24 1528 04/28/24 0500 04/29/24 0500  Weight: 114.8 kg 118.4 kg 114.6 kg    Intake/Output:   Intake/Output Summary (Last 24 hours) at 04/29/2024 0825 Last data filed at 04/29/2024 0700 Gross per 24 hour  Intake 803.51 ml  Output 3700 ml  Net -2896.49 ml      Physical Exam    General: NAD Neck: JVP 10-12 cm, no thyromegaly or thyroid  nodule.  Lungs: Clear to auscultation bilaterally with normal respiratory effort. CV: Nondisplaced PMI.  Heart tachy, irregular S1/S2, no S3/S4, no murmur.  No peripheral edema.   Abdomen: Soft,  nontender, no hepatosplenomegaly, no distention.  Skin: Intact without lesions or rashes.  Neurologic: Alert and oriented x 3.  Psych: Normal affect. Extremities: No clubbing or cyanosis.  HEENT: Normal.   Telemetry   Afib 100s, personally reviewed  Labs    CBC Recent Labs    04/28/24 0419 04/28/24 1735 04/29/24 0514  WBC 9.4  --  8.3  HGB 10.5* 10.2*  10.5* 10.7*  HCT 33.4* 30.0*  31.0* 34.0*  MCV 86.8  --  87.4  PLT 146*  --  154   Basic Metabolic Panel Recent Labs    78/29/56 0414 04/29/24 0514  NA 136 136  K 3.4* 3.9  CL 105 104  CO2 21* 25  GLUCOSE 141* 159*  BUN 16 16  CREATININE 1.13 1.22  CALCIUM  7.3* 8.1*  MG 3.1* 2.1   Liver Function Tests Recent Labs    04/28/24 0038 04/28/24 0550  AST 1,113* 1,294*  ALT 892* 873*  ALKPHOS 37* 28*  BILITOT 1.5* 1.0  PROT 5.8* 4.5*  ALBUMIN 3.0* 2.3*   No results for input(s): "LIPASE", "AMYLASE" in the last 72 hours. Cardiac Enzymes No results for input(s): "CKTOTAL", "CKMB", "CKMBINDEX", "TROPONINI" in the last 72 hours.  BNP: BNP (last 3 results) Recent Labs    04/26/24 1157  BNP 367.0*    ProBNP (last 3 results) No results  for input(s): "PROBNP" in the last 8760 hours.   D-Dimer No results for input(s): "DDIMER" in the last 72 hours. Hemoglobin A1C Recent Labs    04/28/24 0419  HGBA1C 6.3*   Fasting Lipid Panel Recent Labs    04/28/24 0547  CHOL 56  HDL 19*  LDLCALC 26  TRIG 57  CHOLHDL 2.9   Thyroid  Function Tests Recent Labs    04/27/24 0504  TSH 1.558    Other results:   Imaging    CARDIAC CATHETERIZATION Result Date: 04/28/2024 1.  Mild nonobstructive CAD.  Nonischemic cardiomyopathy. 2. Elevated filling pressures. 3. Preserved cardiac output but low PAPi suggests RV dysfunction. 4. Mild pulmonary venous hypertension.     Medications:     Scheduled Medications:  aspirin  EC  81 mg Oral Daily   Chlorhexidine  Gluconate Cloth  6 each Topical Q0600   digoxin   0.125 mg Oral Daily   furosemide  60 mg Intravenous BID   losartan  12.5 mg Oral Daily   rosuvastatin   20 mg Oral Daily   sodium chloride  flush  10-40 mL Intracatheter Q12H   sodium chloride  flush  3 mL Intravenous Q12H   spironolactone  12.5 mg Oral Daily   venlafaxine  XR  150 mg Oral Q breakfast    Infusions:  sodium chloride      amiodarone 30 mg/hr (04/29/24 0430)   heparin 1,700 Units/hr (04/29/24 0430)   milrinone 0.125 mcg/kg/min (04/29/24 0733)   norepinephrine (LEVOPHED) Adult infusion Stopped (04/28/24 0414)    PRN Medications: sodium chloride , acetaminophen , ondansetron (ZOFRAN) IV, mouth rinse, sodium chloride  flush, sodium chloride  flush    Patient Profile   63 y.o. male with history of atrial fibrillation 2019, HTN, HLD, elevated coronary calcium  score, prior tobacco use.   Presented with atrial fibrillation with RVR and acute on chronic CHF. Transferred to [facility] after progressing to cardiogenic shock.  Assessment/Plan   1. Atrial fibrillation: 1 prior episode of AF with DCCV.  Admitted with AF/RVR of uncertain duration.  Initially developed shock with diltiazem /metorprolol, now off diltiazem /metoprolol  and stable BP.  HR in 100s currently in AF.  - Continue amiodarone gtt.  - Heparin gtt for now.  - TEE-guided DCCV today, discussed risks/benefits with patient and he agrees to procedure.  - Will need atrial fibrillation ablation down the road.  2. Acute systolic CHF/cardiogenic shock:  Nonischemic cardiomyopathy.  Echo this admission was difficult/incomplete study, EF around 30%, moderate RV dysfunction, moderate MR. Cause of cardiomyopathy uncertain.  Cath showed mild nonobstructive CAD.  Dyspnea present for 2-3 months, ?long-standing unrecognized AF/RVR with tachycardia-mediated CMP.  He developed shock in setting of diltiazem /metoprolol  for AF/RVR.  Now stable on milrinone 0.25, co-ox 71% with CVP 11-12. Good diuresis with IV Lasix yesterday.  - Wean off milrinone  this morning pre-DCCV.  - Continue Lasix 80 mg IV bid today and replace K.  - Digoxin 0.125 daily - Add spironolactone 12.5 daily - Add losartan 12.5 daily.  - Cardiac MRI when back in NSR.  2. AKI: Creatinine up to 1.77 initially, now 1.22.   3. Elevated LFTs: Suspect shock liver, will trend.  4. ID: WBCs 11.3, afebrile, PCT 0.11.  Think it unlikely that infection is driving his presentation.  - stopped antibiotics.   CRITICAL CARE Performed by: [name]  Total critical care time: 35 minutes  Critical care time was exclusive of separately billable procedures and treating other patients.  Critical care was necessary to treat or prevent imminent or life-threatening deterioration.  Critical  care was time spent personally by me on the following activities: development of treatment plan with patient and/or surrogate as well as nursing, discussions with consultants, evaluation of patient's response to treatment, examination of patient, obtaining history from patient or surrogate, ordering and performing treatments and interventions, ordering and review of laboratory studies, ordering and review of radiographic studies, pulse oximetry and re-evaluation of patient's condition.  [name] 04/29/2024 8:25 AM

## 2024-04-29 NOTE — Transfer of Care (Signed)
 Immediate Anesthesia Transfer of Care Note  Patient: Jason Fleming  Procedure(s) Performed: TRANSESOPHAGEAL ECHOCARDIOGRAM CARDIOVERSION  Patient Location: PACU  Anesthesia Type:MAC  Level of Consciousness: awake and drowsy  Airway & Oxygen Therapy: Patient Spontanous Breathing and Patient connected to nasal cannula oxygen  Post-op Assessment: Report given to RN and Post -op Vital signs reviewed and stable  Post vital signs: Reviewed and stable  Last Vitals:  Vitals Value Taken Time  BP 90/78 04/29/24 0949  Temp 36.3 C 04/29/24 0949  Pulse 70 04/29/24 0952  Resp 10 04/29/24 0952  SpO2 93 % 04/29/24 0952  Vitals shown include unfiled device data.  Last Pain:  Vitals:   04/29/24 0949  TempSrc: Tympanic  PainSc: Asleep      Patients Stated Pain Goal: 0 (04/29/24 0400)  Complications: No notable events documented.

## 2024-04-29 NOTE — Procedures (Signed)
 Electrical Cardioversion Procedure Note Jason Fleming [id number] 1961-07-07  Procedure: Electrical Cardioversion Indications:  Atrial Fibrillation  Procedure Details Consent: Risks of procedure as well as the alternatives and risks of each were explained to the (patient/caregiver).  Consent for procedure obtained. Time Out: Verified patient identification, verified procedure, site/side was marked, verified correct patient position, special equipment/implants available, medications/allergies/relevent history reviewed, required imaging and test results available.  Performed  Patient placed on cardiac monitor, pulse oximetry, supplemental oxygen as necessary.  Sedation given: Propofol  per anesthesiology Pacer pads placed anterior and posterior chest.  Cardioverted 3 times with sternal pressure.  Cardioverted at 360J.  Evaluation Findings: Post procedure EKG shows: Atrial Fibrillation Complications: None Patient did tolerate procedure well.  Will load amiodarone longer and re-attempt in 2-3 days.    Jason Fleming 04/29/2024, 9:49 AM

## 2024-04-29 NOTE — Interval H&P Note (Signed)
 History and Physical Interval Note:  04/29/2024 9:18 AM  Jason Fleming  has presented today for surgery, with the diagnosis of afib.  The various methods of treatment have been discussed with the patient and family. After consideration of risks, benefits and other options for treatment, the patient has consented to  Procedure(s): TRANSESOPHAGEAL ECHOCARDIOGRAM (N/A) CARDIOVERSION (N/A) as a surgical intervention.  The patient's history has been reviewed, patient examined, no change in status, stable for surgery.  I have reviewed the patient's chart and labs.  Questions were answered to the patient's satisfaction.     Helvi Royals [employer]

## 2024-04-29 NOTE — Plan of Care (Signed)
   Problem: Clinical Measurements: Goal: Will remain free from infection Outcome: Progressing Goal: Diagnostic test results will improve Outcome: Progressing Goal: Respiratory complications will improve Outcome: Progressing Goal: Cardiovascular complication will be avoided Outcome: Progressing

## 2024-04-29 NOTE — TOC Progression Note (Signed)
 Transition of Care [facility]) - Progression Note    Patient Details  Name: Jason Fleming MRN: [id number] Date of Birth: 09-08-1961  Transition of Care [facility]) CM/SW Contact  Benjiman Bras, RN Phone Number: [phone] 04/29/2024, 3:52 PM   Clinical Narrative:      Spoke to pt and wife at bedside. Pt states he is self-employed and does not need note for work.   Expected Discharge Plan: Home/Self Care Barriers to Discharge: Continued Medical Work up  Expected Discharge Plan and Services   Discharge Planning Services: CM Consult   Living arrangements for the past 2 months: Single Family Home                                       Social Determinants of Health (SDOH) Interventions SDOH Screenings   Food Insecurity: No Food Insecurity (04/26/2024)  Housing: Low Risk  (04/26/2024)  Transportation Needs: No Transportation Needs (04/26/2024)  Utilities: Not At Risk (04/26/2024)  Alcohol Screen: Low Risk  (04/25/2024)  Depression (PHQ2-9): Low Risk  (04/26/2024)  Financial Resource Strain: Low Risk  (04/25/2024)  Physical Activity: Inactive (04/25/2024)  Social Connections: Moderately Integrated (04/25/2024)  Stress: Stress Concern Present (04/25/2024)  Tobacco Use: Medium Risk (04/26/2024)  Health Literacy: Adequate Health Literacy (08/13/2023)    Readmission Risk Interventions     No data to display

## 2024-04-29 NOTE — Anesthesia Preprocedure Evaluation (Signed)
 Anesthesia Evaluation  Patient identified by MRN, date of birth, ID band Patient awake    Reviewed: Allergy & Precautions, H&P , NPO status , Patient's Chart, lab work & pertinent test results  Airway Mallampati: II  TM Distance: >3 FB Neck ROM: Full    Dental no notable dental hx.    Pulmonary neg pulmonary ROS, former smoker   Pulmonary exam normal breath sounds clear to auscultation       Cardiovascular hypertension, +CHF  Normal cardiovascular exam+ dysrhythmias Atrial Fibrillation  Rhythm:Regular Rate:Normal  Admitted in cardiogenic shock  IMPRESSIONS     1. Left ventricular ejection fraction, by estimation, is 25 to 30%. The  left ventricle has moderately decreased function. Left ventricular  endocardial border not optimally defined to evaluate regional wall motion.  Left ventricular diastolic parameters  are indeterminate.   2. RV not well visualized. Appears enlarged with roughly moderate  systolic dysfunction. Indeterminate PASP, IVC not visualized. . Right  ventricular systolic function was not well visualized. The right  ventricular size is not well visualized.   3. The mitral valve is abnormal. Moderate mitral valve regurgitation. No  evidence of mitral stenosis.   4. The aortic valve is tricuspid. Aortic valve regurgitation is not  visualized. No aortic stenosis is present.   5. Limited study, patient uncomfortable with positioning and asked to end  study.     Neuro/Psych neg Seizures PSYCHIATRIC DISORDERS Anxiety Depression    negative neurological ROS     GI/Hepatic negative GI ROS, Neg liver ROS,neg GERD  ,,  Endo/Other  negative endocrine ROSneg diabetes    Renal/GU negative Renal ROS  negative genitourinary   Musculoskeletal negative musculoskeletal ROS (+)    Abdominal   Peds negative pediatric ROS (+)  Hematology negative hematology ROS (+)   Anesthesia Other Findings    Reproductive/Obstetrics negative OB ROS                              Anesthesia Physical Anesthesia Plan  ASA: 3  Anesthesia Plan: MAC   Post-op Pain Management:    Induction: Intravenous  PONV Risk Score and Plan: 1 and Propofol  infusion and Treatment may vary due to age or medical condition  Airway Management Planned: Natural Airway  Additional Equipment:   Intra-op Plan:   Post-operative Plan:   Informed Consent: I have reviewed the patients History and Physical, chart, labs and discussed the procedure including the risks, benefits and alternatives for the proposed anesthesia with the patient or authorized representative who has indicated his/her understanding and acceptance.     Dental advisory given  Plan Discussed with: CRNA  Anesthesia Plan Comments:          Anesthesia Quick Evaluation

## 2024-04-29 NOTE — Anesthesia Postprocedure Evaluation (Signed)
 Anesthesia Post Note  Patient: Jason Fleming  Procedure(s) Performed: TRANSESOPHAGEAL ECHOCARDIOGRAM CARDIOVERSION     Patient location during evaluation: Cath Lab Anesthesia Type: MAC Level of consciousness: awake and alert Pain management: pain level controlled Vital Signs Assessment: post-procedure vital signs reviewed and stable Respiratory status: spontaneous breathing, nonlabored ventilation, respiratory function stable and patient connected to nasal cannula oxygen Cardiovascular status: stable and blood pressure returned to baseline Postop Assessment: no apparent nausea or vomiting Anesthetic complications: no   No notable events documented.  Last Vitals:  Vitals:   04/29/24 1009 04/29/24 1019  BP: 94/61 109/87  Pulse: (!) 115 86  Resp: 18 14  Temp:    SpO2: 96% 96%    Last Pain:  Vitals:   04/29/24 1019  TempSrc:   PainSc: 0-No pain                 Lethaniel Rave

## 2024-04-30 ENCOUNTER — Encounter: Payer: Self-pay | Admitting: *Deleted

## 2024-04-30 LAB — COMPREHENSIVE METABOLIC PANEL WITH GFR
ALT: 1302 U/L — ABNORMAL HIGH (ref 0–44)
AST: 842 U/L — ABNORMAL HIGH (ref 15–41)
Albumin: 3.3 g/dL — ABNORMAL LOW (ref 3.5–5.0)
Alkaline Phosphatase: 52 U/L (ref 38–126)
Anion gap: 14 (ref 5–15)
BUN: 14 mg/dL (ref 8–23)
CO2: 26 mmol/L (ref 22–32)
Calcium: 8 mg/dL — ABNORMAL LOW (ref 8.9–10.3)
Chloride: 97 mmol/L — ABNORMAL LOW (ref 98–111)
Creatinine, Ser: 1.07 mg/dL (ref 0.61–1.24)
GFR, Estimated: >60 mL/min (ref >60)
Glucose, Bld: 263 mg/dL — ABNORMAL HIGH (ref 70–99)
Potassium: 3.5 mmol/L (ref 3.5–5.1)
Sodium: 137 mmol/L (ref 135–145)
Total Bilirubin: 0.8 mg/dL (ref 0.0–1.2)
Total Protein: 6.4 g/dL — ABNORMAL LOW (ref 6.5–8.1)

## 2024-04-30 LAB — ECHO TEE

## 2024-04-30 LAB — COOXEMETRY PANEL
Carboxyhemoglobin: 1.2 % (ref 0.5–1.5)
Carboxyhemoglobin: 1.3 % (ref 0.5–1.5)
Methemoglobin: <0.7 % (ref 0.0–1.5)
Methemoglobin: <0.7 % (ref 0.0–1.5)
O2 Saturation: 58.3 %
O2 Saturation: 56.1 %
Total hemoglobin: 11.8 g/dL — ABNORMAL LOW (ref 12.0–16.0)
Total hemoglobin: 11.1 g/dL — ABNORMAL LOW (ref 12.0–16.0)

## 2024-04-30 LAB — CBC
HCT: 36.3 % — ABNORMAL LOW (ref 39.0–52.0)
Hemoglobin: 11.5 g/dL — ABNORMAL LOW (ref 13.0–17.0)
MCH: 27.3 pg (ref 26.0–34.0)
MCHC: 31.7 g/dL (ref 30.0–36.0)
MCV: 86.2 fL (ref 80.0–100.0)
Platelets: 175 10*3/uL (ref 150–400)
RBC: 4.21 MIL/uL — ABNORMAL LOW (ref 4.22–5.81)
RDW: 14.6 % (ref 11.5–15.5)
WBC: 9.3 10*3/uL (ref 4.0–10.5)
nRBC: 0.6 % — ABNORMAL HIGH (ref 0.0–0.2)

## 2024-04-30 LAB — APTT: aPTT: 119 s — ABNORMAL HIGH (ref 24–36)

## 2024-04-30 LAB — LIPOPROTEIN A (LPA): Lipoprotein (a): 21.5 nmol/L (ref <75.0)

## 2024-04-30 LAB — HEPARIN LEVEL (UNFRACTIONATED): Heparin Unfractionated: 0.6 [IU]/mL (ref 0.30–0.70)

## 2024-04-30 LAB — MAGNESIUM: Magnesium: 1.9 mg/dL (ref 1.7–2.4)

## 2024-04-30 MED ORDER — POTASSIUM CHLORIDE CRYS ER 20 MEQ PO TBCR: 40.0000 meq | EXTENDED_RELEASE_TABLET | Freq: Once | ORAL | Status: DC

## 2024-04-30 MED ORDER — POTASSIUM CHLORIDE CRYS ER 20 MEQ PO TBCR
40.0000 meq | EXTENDED_RELEASE_TABLET | Freq: Once | ORAL | Status: AC
  Administered 2024-04-30: 40 meq via ORAL
  Filled 2024-04-30: qty 2

## 2024-04-30 MED ORDER — SPIRONOLACTONE 25 MG PO TABS
25.0000 mg | ORAL_TABLET | Freq: Every day | ORAL | Status: DC
  Administered 2024-04-30 – 2024-05-03 (×4): 25 mg via ORAL
  Filled 2024-04-30 (×4): qty 1

## 2024-04-30 MED ORDER — MAGNESIUM SULFATE 2 GM/50ML IV SOLN
2.0000 g | Freq: Once | INTRAVENOUS | Status: AC
  Administered 2024-04-30: 2 g via INTRAVENOUS
  Filled 2024-04-30: qty 50

## 2024-04-30 MED ORDER — POLYETHYLENE GLYCOL 3350 17 G PO PACK
17.0000 g | PACK | Freq: Two times a day (BID) | ORAL | Status: DC
  Filled 2024-04-30 (×3): qty 1

## 2024-04-30 MED ORDER — SACUBITRIL-VALSARTAN 24-26 MG PO TABS
1.0000 | ORAL_TABLET | Freq: Two times a day (BID) | ORAL | Status: DC
  Administered 2024-04-30 – 2024-05-03 (×7): 1 via ORAL
  Filled 2024-04-30 (×8): qty 1

## 2024-04-30 MED ORDER — APIXABAN 5 MG PO TABS
5.0000 mg | ORAL_TABLET | Freq: Two times a day (BID) | ORAL | Status: DC
  Administered 2024-04-30 – 2024-05-03 (×7): 5 mg via ORAL
  Filled 2024-04-30 (×7): qty 1

## 2024-04-30 MED ORDER — SENNOSIDES-DOCUSATE SODIUM 8.6-50 MG PO TABS: 2.0000 | ORAL_TABLET | Freq: Every day | ORAL | Status: DC

## 2024-04-30 NOTE — Plan of Care (Signed)
  Problem: Education: Goal: Knowledge of General Education information will improve Description: Including pain rating scale, medication(s)/side effects and non-pharmacologic comfort measures Outcome: Progressing   Problem: Health Behavior/Discharge Planning: Goal: Ability to manage health-related needs will improve Outcome: Progressing   Problem: Clinical Measurements: Goal: Ability to maintain clinical measurements within normal limits will improve Outcome: Progressing Goal: Will remain free from infection Outcome: Progressing Goal: Diagnostic test results will improve Outcome: Progressing Goal: Respiratory complications will improve Outcome: Progressing Goal: Cardiovascular complication will be avoided Outcome: Progressing   Problem: Activity: Goal: Risk for activity intolerance will decrease Outcome: Progressing   Problem: Nutrition: Goal: Adequate nutrition will be maintained Outcome: Progressing   Problem: Coping: Goal: Level of anxiety will decrease Outcome: Progressing   Problem: Elimination: Goal: Will not experience complications related to bowel motility Outcome: Progressing Goal: Will not experience complications related to urinary retention Outcome: Progressing   Problem: Pain Managment: Goal: General experience of comfort will improve and/or be controlled Outcome: Progressing   Problem: Safety: Goal: Ability to remain free from injury will improve Outcome: Progressing   Problem: Skin Integrity: Goal: Risk for impaired skin integrity will decrease Outcome: Progressing   Problem: Education: Goal: Knowledge of disease or condition will improve Outcome: Progressing Goal: Understanding of medication regimen will improve Outcome: Progressing Goal: Individualized Educational Video(s) Outcome: Progressing   Problem: Activity: Goal: Ability to tolerate increased activity will improve Outcome: Progressing   Problem: Cardiac: Goal: Ability to achieve  and maintain adequate cardiopulmonary perfusion will improve Outcome: Progressing   Problem: Health Behavior/Discharge Planning: Goal: Ability to safely manage health-related needs after discharge will improve Outcome: Progressing   Problem: Education: Goal: Understanding of CV disease, CV risk reduction, and recovery process will improve Outcome: Progressing Goal: Individualized Educational Video(s) Outcome: Progressing   Problem: Activity: Goal: Ability to return to baseline activity level will improve Outcome: Progressing   Problem: Cardiovascular: Goal: Ability to achieve and maintain adequate cardiovascular perfusion will improve Outcome: Progressing Goal: Vascular access site(s) Level 0-1 will be maintained Outcome: Progressing   Problem: Health Behavior/Discharge Planning: Goal: Ability to safely manage health-related needs after discharge will improve Outcome: Progressing   Problem: Education: Goal: Understanding of CV disease, CV risk reduction, and recovery process will improve Outcome: Progressing Goal: Individualized Educational Video(s) Outcome: Progressing   Problem: Activity: Goal: Ability to return to baseline activity level will improve Outcome: Progressing   Problem: Cardiovascular: Goal: Ability to achieve and maintain adequate cardiovascular perfusion will improve Outcome: Progressing Goal: Vascular access site(s) Level 0-1 will be maintained Outcome: Progressing   Problem: Health Behavior/Discharge Planning: Goal: Ability to safely manage health-related needs after discharge will improve Outcome: Progressing

## 2024-04-30 NOTE — Progress Notes (Addendum)
 ANTICOAGULATION CONSULT NOTE  Pharmacy Consult for Eliquis  > Heparin Indication: atrial fibrillation  No Known Allergies  Patient Measurements: Height: 6\' 2"  (188 cm) Weight: 110.7 kg (244 lb 0.8 oz) IBW/kg (Calculated) : 82.2 Heparin Dosing Weight: 106 kg  Vital Signs: Temp: 98.3 F (36.8 C) (06/06 0400) Temp Source: Oral (06/06 0400) BP: 145/121 (06/06 0617) Pulse Rate: 107 (06/06 0617)  Labs: Recent Labs    04/27/24 0758 04/27/24 1509 04/28/24 0419 04/28/24 0550 04/28/24 1735 04/29/24 0414 04/29/24 0514 04/29/24 1134 04/30/24 0353  HGB  --    < > 10.5*  --  10.2*  10.5*  --  10.7*  --  11.5*  HCT  --    < > 33.4*  --  30.0*  31.0*  --  34.0*  --  36.3*  PLT  --    < > 146*  --   --   --  154  --  175  APTT  --   --   --   --   --   --   --  84* 119*  HEPARINUNFRC 0.34  --   --   --   --   --   --  0.77* 0.60  CREATININE  --    < >  --    < >  --  1.13 1.22  --  1.07   < > = values in this interval not displayed.    Estimated Creatinine Clearance: 94.8 mL/min (by C-G formula based on SCr of 1.07 mg/dL).  Medical History: Past Medical History:  Diagnosis Date   Anxiety    Atrial fibrillation (HCC)    Depression    Hypertension    Paroxysmal atrial fibrillation (HCC)    cardioversion in ed 05/2018    Medications:  See MAR  Assessment: 64 yo male presents with AF RVR (noted hx PAF 2019 s/p DCCV - took Eliquis  x38mo then stopped w/ CHA2DsVASc 0).  Not on anticoagulation prior to admission.  Started on heparin at [facility] and transitioned to Eliquis  6/3.  Pharmacy consulted for heparin dosing while Eliquis  on hold for procedures (LD apixaban  6/3@2200 ).   S/p LHC finding nonischemic cardiomyopathy in addition to elevated filling pressures and low PAPi.  Unsuccessful DCCV this morning. -aPTT 119 sec, above goal likely due to shock liver.  Heparin level 0.6 is therapeutic (s/p DOAC washout) -CBC stable  Goal of Therapy:  Heparin level 0.3-0.7  units/ml aPTT 66-102 seconds Monitor platelets by anticoagulation protocol: Yes   Plan:  Continue heparin IV 1500 units/hr  Daily CBC, heparin level Monitor for s/sx of bleeding  ADDENDUM - plan to cardiovert Sat or Sun after more IV amiodarone.  OK to transition to Eliquis  (will get 3 doses by Sat AM).  Start Eliquis  5 mg twice daily.  Has Nurse, learning disability and can use copay cards.  Will educate closer to discharge.  Thank you for allowing pharmacy to participate in this patient's care,  Cecillia Cogan, PharmD Clinical Pharmacist 04/30/2024  7:28 AM

## 2024-04-30 NOTE — Plan of Care (Signed)
  Problem: Education: Goal: Knowledge of General Education information will improve Description: Including pain rating scale, medication(s)/side effects and non-pharmacologic comfort measures Outcome: Progressing   Problem: Health Behavior/Discharge Planning: Goal: Ability to manage health-related needs will improve Outcome: Progressing   Problem: Clinical Measurements: Goal: Ability to maintain clinical measurements within normal limits will improve Outcome: Progressing Goal: Will remain free from infection Outcome: Progressing Goal: Diagnostic test results will improve Outcome: Progressing Goal: Respiratory complications will improve Outcome: Progressing   Problem: Pain Managment: Goal: General experience of comfort will improve and/or be controlled Outcome: Progressing   Problem: Safety: Goal: Ability to remain free from injury will improve Outcome: Progressing   Problem: Education: Goal: Knowledge of disease or condition will improve Outcome: Progressing Goal: Understanding of medication regimen will improve Outcome: Progressing

## 2024-04-30 NOTE — Plan of Care (Signed)
   Problem: Clinical Measurements: Goal: Will remain free from infection Outcome: Progressing Goal: Diagnostic test results will improve Outcome: Progressing Goal: Respiratory complications will improve Outcome: Progressing Goal: Cardiovascular complication will be avoided Outcome: Progressing   Problem: Activity: Goal: Risk for activity intolerance will decrease Outcome: Progressing

## 2024-04-30 NOTE — Progress Notes (Addendum)
 Advanced Heart Failure Rounding Note  Cardiologist: Maudine Sos, MD  Chief Complaint: Afib Subjective:    Coox 58% CVP 6-7. CO/CI (fick) 5.0/2.1  Off Milrinone.  3.25L UOP (net -1.5L). Weight down 8 lbs Remains in AF, rate 90-130s. Hypertensive overnight.   Feeling better this am. No dyspnea at rest or orthopnea.   Objective:    Weight Range: 110.7 kg Body mass index is 31.33 kg/m.   Vital Signs:   Temp:  [97.3 F (36.3 C)-98.4 F (36.9 C)] 98.3 F (36.8 C) (06/06 0400) Pulse Rate:  [67-219] 107 (06/06 0617) Resp:  [9-24] 18 (06/06 0617) BP: (90-165)/(58-128) 145/121 (06/06 0617) SpO2:  [90 %-98 %] 90 % (06/06 0617) Weight:  [110.7 kg] 110.7 kg (06/06 0500) Last BM Date : 04/27/24  Weight change: Filed Weights   04/28/24 0500 04/29/24 0500 04/30/24 0500  Weight: 118.4 kg 114.6 kg 110.7 kg   Intake/Output:  Intake/Output Summary (Last 24 hours) at 04/30/2024 0701 Last data filed at 04/30/2024 0600 Gross per 24 hour  Intake 1728.86 ml  Output 3250 ml  Net -1521.14 ml    Physical Exam    CVP 6-7 General: Well appearing. No distress on RA Cardiac: JVP flat. S1 and S2 present. No murmurs or rub. Extremities: Warm and dry.  No edema.  Neuro: Alert and oriented x3. Affect pleasant. Moves all extremities without difficulty. Lines/Devices:  RUE PICC  Telemetry   AF 90-130s (personally reviewed)  EKG    No new to review  Labs    CBC Recent Labs    04/29/24 0514 04/30/24 0353  WBC 8.3 9.3  HGB 10.7* 11.5*  HCT 34.0* 36.3*  MCV 87.4 86.2  PLT 154 175   Basic Metabolic Panel Recent Labs    40/98/11 0514 04/30/24 0353  NA 136 137  K 3.9 3.5  CL 104 97*  CO2 25 26  GLUCOSE 159* 263*  BUN 16 14  CREATININE 1.22 1.07  CALCIUM  8.1* 8.0*  MG 2.1 1.9   Liver Function Tests Recent Labs    04/29/24 1134 04/30/24 0353  AST 1,726* 842*  ALT 1,751* 1,302*  ALKPHOS 49 52  BILITOT 1.0 0.8  PROT 6.6 6.4*  ALBUMIN 3.4* 3.3*   BNP (last 3  results) Recent Labs    04/26/24 1157  BNP 367.0*   Hemoglobin A1C Recent Labs    04/28/24 0419  HGBA1C 6.3*   Fasting Lipid Panel Recent Labs    04/28/24 0547  CHOL 56  HDL 19*  LDLCALC 26  TRIG 57  CHOLHDL 2.9   Imaging   EP STUDY Result Date: 04/29/2024 See surgical note for result.  Medications:    Scheduled Medications:  aspirin  EC  81 mg Oral Daily   Chlorhexidine  Gluconate Cloth  6 each Topical Q0600   digoxin  0.125 mg Oral Daily   furosemide  80 mg Intravenous BID   losartan  12.5 mg Oral Daily   sodium chloride  flush  10-40 mL Intracatheter Q12H   sodium chloride  flush  3 mL Intravenous Q12H   spironolactone  12.5 mg Oral Daily   venlafaxine  XR  150 mg Oral Q breakfast    Infusions:  amiodarone 60 mg/hr (04/30/24 0600)   heparin 1,700 Units/hr (04/30/24 0600)   magnesium sulfate bolus IVPB     milrinone Stopped (04/29/24 0827)   norepinephrine (LEVOPHED) Adult infusion Stopped (04/28/24 0414)    PRN Medications: acetaminophen , ondansetron (ZOFRAN) IV, mouth rinse, sodium chloride  flush, sodium chloride  flush  Patient Profile   63 y.o. male with history of atrial fibrillation 2019, HTN, HLD, elevated coronary calcium  score, prior tobacco use.   Presented with atrial fibrillation with RVR and acute on chronic CHF. Transferred to [facility] after progressing to cardiogenic shock.  Assessment/Plan   1. Atrial fibrillation: 1 prior episode of AF with DCCV.  Admitted with AF/RVR of uncertain duration.  Initially developed shock with diltiazem /metorprolol, now off diltiazem /metoprolol  and stable BP. Unsuccessful TEE/DCCV 6/5. - remains in Afib, rates 90-130s - continue amiodarone gtt, while on amio - continue heparin gtt - Eventual repeat DCCV.   - will need atrial fibrillation ablation down the road.  2. Acute systolic CHF/cardiogenic shock: Echo 6/4 was difficult/incomplete study, EF around 30%, moderate RV dysfunction, moderate MR. Cause of  cardiomyopathy uncertain. Elevated calcium  score in past in LAD territory so has some degree of CAD.  HS-TnI normal this admission so no ACS. Dyspnea present for 2-3 months, ?long-standing unrecognized AF/RVR with tachycardia-mediated CMP.  He developed shock in setting of diltiazem /metoprolol  for AF/RVR. TEE 6/5 on Dr. Mitzie Anda read EF 25-30%, RV mildly down, two jets of MR, overall moderate. [facility] 6/5 showed right dominant coronaries with minimal CAD in LAD. RA 16, PA 43/23 (30), PCWP 23, CO/XI( TD) 6.8/2.8, PVR 1.0, PVR 1.25. Now off milrinone with co-ox 56% and CVP 6-7. - shock resolved. - Coox 56%. Off milrinone - Continue Lasix 80 mg IV bid today and replace K.  - continue Digoxin 0.125 daily - increase spironolactone 25 daily - hypertensive; switch losartan to entresto 24/26 mg bid - CMRI when back in NSR.  - Eventual AF ablation.  3. AKI: Creatinine up to 1.77 initially, now AKI resolved.  4. Elevated LFTs: Suspect shock liver. Improving, remain significantly elevated. 5. ID: WBCs 11.3, afebrile, PCT 0.11. WBC down to 9.  Think it unlikely that infection is driving his presentation.  - off antibiotics.    Length of Stay: 3  CRITICAL CARE Performed by: [place] Lee  Total critical care time: 12 minutes  Critical care time was exclusive of separately billable procedures and treating other patients.  Critical care was necessary to treat or prevent imminent or life-threatening deterioration.  Critical care was time spent personally by me on the following activities: development of treatment plan with patient and/or surrogate as well as nursing, discussions with consultants, evaluation of patient's response to treatment, examination of patient, obtaining history from patient or surrogate, ordering and performing treatments and interventions, ordering and review of laboratory studies, ordering and review of radiographic studies, pulse oximetry and re-evaluation of patient's condition.  [place]  Lee, NP  04/30/2024, 7:01 AM  Advanced Heart Failure Team Pager [phone] (M-F; 7a - 5p)  Please contact [facility] for night-coverage after hours (5p -7a ) and weekends on [email or web address]   Patient seen with NP, I formulated the plan and agree with the above note.   CVP 4 currently, co-ox 56% off milrinone.  He remains in AF 110s-120s on amiodarone gtt 60 mg/hr and heparin gtt.  I/Os net negative 1473 yesterday, weight down.  BP elevated.   Breathing is improved.   General: NAD Neck: No JVD, no thyromegaly or thyroid  nodule.  Lungs: Clear to auscultation bilaterally with normal respiratory effort. CV: Nondisplaced PMI.  Heart tachy, irregular S1/S2, no S3/S4, no murmur.  No peripheral edema.  Abdomen: Soft, nontender, no hepatosplenomegaly, no distention.  Skin: Intact without lesions or rashes.  Neurologic: Alert and oriented x 3.  Psych: Normal affect. Extremities: No clubbing  or cyanosis.  HEENT: Normal.   Failed DCCV yesterday but had just stopped milrinone.  Will continue amiodarone loading with 60 mg/hr gtt. Will plan DCCV Saturday or Sunday again when he has more amiodarone in him and has been off milrinone longer.  Will discuss with CCM for sedation tomorrow.  NPO at midnight.   CVP now 4, stop IV Lasix. Will stop losartan and start Entresto 24/26 bid with elevated BP.  Increase spironolactone to 25 daily.  SGLT2 inhibitor tomorrow. Creatinine stable 1.07. Cardiac MRI when back in NSR.   LFTs trending down, suspect shock liver.   CRITICAL CARE Performed by: [name]  Total critical care time: 35 minutes  Critical care time was exclusive of separately billable procedures and treating other patients.  Critical care was necessary to treat or prevent imminent or life-threatening deterioration.  Critical care was time spent personally by me on the following activities: development of treatment plan with patient and/or surrogate as well as nursing, discussions with  consultants, evaluation of patient's response to treatment, examination of patient, obtaining history from patient or surrogate, ordering and performing treatments and interventions, ordering and review of laboratory studies, ordering and review of radiographic studies, pulse oximetry and re-evaluation of patient's condition.  [name] 04/30/2024 9:42 AM

## 2024-05-01 DIAGNOSIS — Z0189 Encounter for other specified special examinations: Secondary | ICD-10-CM

## 2024-05-01 LAB — CBC
HCT: 44.4 % (ref 39.0–52.0)
Hemoglobin: 13.7 g/dL (ref 13.0–17.0)
MCH: 27 pg (ref 26.0–34.0)
MCHC: 30.9 g/dL (ref 30.0–36.0)
MCV: 87.4 fL (ref 80.0–100.0)
Platelets: 254 10*3/uL (ref 150–400)
RBC: 5.08 MIL/uL (ref 4.22–5.81)
RDW: 14.7 % (ref 11.5–15.5)
WBC: 11.4 10*3/uL — ABNORMAL HIGH (ref 4.0–10.5)
nRBC: 0.4 % — ABNORMAL HIGH (ref 0.0–0.2)

## 2024-05-01 LAB — COMPREHENSIVE METABOLIC PANEL WITH GFR
ALT: 940 U/L — ABNORMAL HIGH (ref 0–44)
AST: 345 U/L — ABNORMAL HIGH (ref 15–41)
Albumin: 3.5 g/dL (ref 3.5–5.0)
Alkaline Phosphatase: 57 U/L (ref 38–126)
Anion gap: 8 (ref 5–15)
BUN: 10 mg/dL (ref 8–23)
CO2: 25 mmol/L (ref 22–32)
Calcium: 8.6 mg/dL — ABNORMAL LOW (ref 8.9–10.3)
Chloride: 102 mmol/L (ref 98–111)
Creatinine, Ser: 0.87 mg/dL (ref 0.61–1.24)
GFR, Estimated: >60 mL/min (ref >60)
Glucose, Bld: 103 mg/dL — ABNORMAL HIGH (ref 70–99)
Potassium: 4.5 mmol/L (ref 3.5–5.1)
Sodium: 135 mmol/L (ref 135–145)
Total Bilirubin: 0.8 mg/dL (ref 0.0–1.2)
Total Protein: 6.9 g/dL (ref 6.5–8.1)

## 2024-05-01 LAB — COOXEMETRY PANEL
Carboxyhemoglobin: 1.2 % (ref 0.5–1.5)
Methemoglobin: <0.7 % (ref 0.0–1.5)
O2 Saturation: 58.9 %
Total hemoglobin: 14.3 g/dL (ref 12.0–16.0)

## 2024-05-01 LAB — MAGNESIUM: Magnesium: 2.1 mg/dL (ref 1.7–2.4)

## 2024-05-01 MED ORDER — TORSEMIDE 20 MG PO TABS
40.0000 mg | ORAL_TABLET | Freq: Two times a day (BID) | ORAL | Status: DC
  Administered 2024-05-01 – 2024-05-03 (×4): 40 mg via ORAL
  Filled 2024-05-01 (×4): qty 2

## 2024-05-01 MED ORDER — PHENYLEPHRINE 200 MCG/ML (NON-ED) FOR PRIAPISM
100.0000 ug | Freq: Once | INTRAMUSCULAR | Status: DC
  Filled 2024-05-01: qty 10

## 2024-05-01 MED ORDER — VENLAFAXINE HCL ER 150 MG PO CP24
150.0000 mg | ORAL_CAPSULE | Freq: Every day | ORAL | Status: DC
  Administered 2024-05-01 – 2024-05-03 (×3): 150 mg via ORAL
  Filled 2024-05-01 (×3): qty 1

## 2024-05-01 MED ORDER — PROPOFOL 10 MG/ML IV BOLUS
300.0000 mg | Freq: Once | INTRAVENOUS | Status: AC
  Administered 2024-05-01: 300 mg via INTRAVENOUS
  Filled 2024-05-01: qty 40

## 2024-05-01 NOTE — Consult Note (Addendum)
 NAME:  Jason Fleming, MRN:  [id number], DOB:  Mar 12, 1961, LOS: 4 ADMISSION DATE:  04/26/2024, CONSULTATION DATE:  6/7 REFERRING MD:  Armida Lander, CHIEF COMPLAINT:  needs sedation for cardioversion   History of Present Illness:  Jason Fleming is a 63 y/o gentleman admitted on 6/2 for acute HFrEF with Afib with RVR. He was initially treated with diltiazem  for RVR, but was switched to Bblocker. He unfortunately developed hypotension and cardiogenic shock requiring milrinone  and amiodarone . Milrinone  was d/c and he underwent unsuccessful cardioversion on 6/5. Has been on amiodarone  since but remains off milrinone  while he was diuresed additionally. Cardiology requesting conscious sedation for repeat cardioversion today.  Previously cardioversion successfully in 2019, and he has not been on [facility] chronically. Currently on apixaban . NPO today. No dental implants/ loose teeth. No previous issues with anesthesia. Not on PTA benzos, opiates.    Pertinent  Medical History  Afib Acute HFrEF HTN depression  Significant Hospital Events: Including procedures, antibiotic start and stop dates in addition to other pertinent events   6/2 admission 6/3 moved to ICU 6/4 unsuccessful cardioversion  Interim History / Subjective:    Objective    Blood pressure 111/81, pulse (!) 108, temperature 97.7 F (36.5 C), temperature source Oral, resp. rate 17, height 6\' 2"  (1.88 m), weight 110.7 kg, SpO2 94%. CVP:  [3 mmHg-8 mmHg] 5 mmHg      Intake/Output Summary (Last 24 hours) at 05/01/2024 1038 Last data filed at 05/01/2024 0700 Gross per 24 hour  Intake 1547.42 ml  Output 3045 ml  Net -1497.58 ml   Filed Weights   04/29/24 0500 04/30/24 0500 05/01/24 0420  Weight: 114.6 kg 110.7 kg 110.7 kg    Examination: General: elderly man lying in bed in NAD HENT: [place]/AT, eyes anicteric, Mallampati 2 Lungs: breathing comfortably on RA, CTAB Cardiovascular: S1S2, irreg rhythm, minimally tachycardic Abdomen: soft,  NT Extremities: no peripheral edema Neuro: awake, alert, moving all extremities Derm: warm, well-perfused  Resolved problem list   Assessment and Plan   Paroxysmal Afib Cardiogenic shock resolved Acute HFrEF -Planning for cardioversion with sedation in ICU. Discussed risks and benefits of sedation. Wife present for discussion. He did well with sedation regimen on 6/4-- ok with doing the same thing again today (propofol ). Discussed possibility of respiratory failure, potentially requiring intubation if severe sedation. He understands this risk and need for close monitoring.  -ASA 2 -con't apixaban     Best Practice (right click and "Reselect all SmartList Selections" daily)  Per primary  Labs   CBC: Recent Labs  Lab 04/28/24 0038 04/28/24 0419 04/28/24 1735 04/29/24 0514 04/30/24 0353 05/01/24 0516  WBC 11.3* 9.4  --  8.3 9.3 11.4*  HGB 10.6* 10.5* 10.2*  10.5* 10.7* 11.5* 13.7  HCT 33.9* 33.4* 30.0*  31.0* 34.0* 36.3* 44.4  MCV 87.6 86.8  --  87.4 86.2 87.4  PLT 143* 146*  --  154 175 254    Basic Metabolic Panel: Recent Labs  Lab 04/28/24 0550 04/28/24 1735 04/29/24 0414 04/29/24 0514 04/30/24 0353 05/01/24 0516  NA 141 140  140 136 136 137 135  K 3.1* 3.8  3.9 3.4* 3.9 3.5 4.5  CL 116*  --  105 104 97* 102  CO2 18*  --  21* 25 26 25   GLUCOSE 105*  --  141* 159* 263* 103*  BUN 16  --  16 16 14 10   CREATININE 1.35*  --  1.13 1.22 1.07 0.87  CALCIUM  6.6*  --  7.3* 8.1*  8.0* 8.6*  MG 1.5*  --  3.1* 2.1 1.9 2.1   GFR: Estimated Creatinine Clearance: 116.6 mL/min (by C-G formula based on SCr of 0.87 mg/dL). Recent Labs  Lab 04/27/24 1509 04/28/24 0038 04/28/24 0419 04/29/24 0514 04/30/24 0353 05/01/24 0516  PROCALCITON 0.11  --   --   --   --   --   WBC 7.2 11.3* 9.4 8.3 9.3 11.4*  LATICACIDVEN 3.4* 1.3 1.1  --   --   --     Liver Function Tests: Recent Labs  Lab 04/28/24 0038 04/28/24 0550 04/29/24 1134 04/30/24 0353 05/01/24 0516  AST  1,113* 1,294* 1,726* 842* 345*  ALT 892* 873* 1,751* 1,302* 940*  ALKPHOS 37* 28* 49 52 57  BILITOT 1.5* 1.0 1.0 0.8 0.8  PROT 5.8* 4.5* 6.6 6.4* 6.9  ALBUMIN 3.0* 2.3* 3.4* 3.3* 3.5   No results for input(s): "LIPASE", "AMYLASE" in the last 168 hours. No results for input(s): "AMMONIA" in the last 168 hours.  ABG    Component Value Date/Time   HCO3 21.2 04/28/2024 1735   HCO3 21.1 04/28/2024 1735   TCO2 22 04/28/2024 1735   TCO2 22 04/28/2024 1735   ACIDBASEDEF 4.0 (H) 04/28/2024 1735   ACIDBASEDEF 4.0 (H) 04/28/2024 1735   O2SAT 58.9 05/01/2024 0516     Coagulation Profile: No results for input(s): "INR", "PROTIME" in the last 168 hours.  Cardiac Enzymes: No results for input(s): "CKTOTAL", "CKMB", "CKMBINDEX", "TROPONINI" in the last 168 hours.  HbA1C: Hgb A1c MFr Bld  Date/Time Value Ref Range Status  04/28/2024 04:19 AM 6.3 (H) 4.8 - 5.6 % Final    Comment:    (NOTE) Diagnosis of Diabetes The following HbA1c ranges recommended by the American Diabetes Association (ADA) may be used as an aid in the diagnosis of diabetes mellitus.  Hemoglobin             Suggested A1C NGSP%              Diagnosis  <5.7                   Non Diabetic  5.7-6.4                Pre-Diabetic  >6.4                   Diabetic  <7.0                   Glycemic control for                       adults with diabetes.    09/13/2021 10:18 AM 5.4 <5.7 % of total Hgb Final    Comment:    For the purpose of screening for the presence of diabetes: . <5.7%       Consistent with the absence of diabetes 5.7-6.4%    Consistent with increased risk for diabetes             (prediabetes) > or =6.5%  Consistent with diabetes . This assay result is consistent with a decreased risk of diabetes. . Currently, no consensus exists regarding use of hemoglobin A1c for diagnosis of diabetes in children. . According to American Diabetes Association (ADA) guidelines, hemoglobin A1c <7.0% represents  optimal control in non-pregnant diabetic patients. Different metrics may apply to specific patient populations.  Standards of Medical Care in Diabetes(ADA). .     CBG: Recent Labs  Lab 04/27/24  2208  GLUCAP 120*    Review of Systems:   Review of Systems  HENT: Negative.    Respiratory: Negative.    Cardiovascular: Negative.   Gastrointestinal: Negative.   Skin: Negative.      Past Medical History:  He,  has a past medical history of Anxiety, Atrial fibrillation (HCC), Depression, Hypertension, and Paroxysmal atrial fibrillation (HCC).   Surgical History:   Past Surgical History:  Procedure Laterality Date   CARDIOVERSION N/A 04/29/2024   Procedure: CARDIOVERSION;  Surgeon: Darlis Eisenmenger, MD;  Location: [facility] INVASIVE CV LAB;  Service: Cardiovascular;  Laterality: N/A;   CARPAL TUNNEL RELEASE     COLONOSCOPY  2013   Dr.Hung-normal exam   MASS EXCISION  05/26/2012   Procedure: MINOR EXCISION OF MASS;  Surgeon: Amelie Baize., MD;  Location: [facility];  Service: Orthopedics;  Laterality: Left;   RIGHT/LEFT HEART CATH AND CORONARY ANGIOGRAPHY N/A 04/28/2024   Procedure: RIGHT/LEFT HEART CATH AND CORONARY ANGIOGRAPHY;  Surgeon: Darlis Eisenmenger, MD;  Location: [facility] INVASIVE CV LAB;  Service: Cardiovascular;  Laterality: N/A;   TONSILLECTOMY     TRANSESOPHAGEAL ECHOCARDIOGRAM (CATH LAB) N/A 04/29/2024   Procedure: TRANSESOPHAGEAL ECHOCARDIOGRAM;  Surgeon: Darlis Eisenmenger, MD;  Location: [facility] INVASIVE CV LAB;  Service: Cardiovascular;  Laterality: N/A;   ULNAR NERVE REPAIR Bilateral    WISDOM TOOTH EXTRACTION       Social History:   reports that he quit smoking about 12 years ago. His smoking use included e-cigarettes. He has never used smokeless tobacco. He reports that he does not currently use alcohol after a past usage of about 3.0 standard drinks of alcohol per week. He reports that he does not use drugs.   Family History:  His family history includes  Alcohol abuse in his maternal uncle and paternal uncle; Anxiety disorder in his mother; CAD in his father; Depression in his father; Heart disease in his father; Hypertension in his mother; Stroke in his maternal grandfather. There is no history of Colon cancer, Colon polyps, Esophageal cancer, Stomach cancer, or Rectal cancer.   Allergies No Known Allergies   Home Medications  Prior to Admission medications   Medication Sig Start Date End Date Taking? Authorizing Provider  aspirin  EC 81 MG tablet Take 1 tablet (81 mg total) by mouth daily. Swallow whole. 07/29/23  Yes Austine Lefort, MD  Multiple Vitamin (MULTIVITAMIN) tablet Take 1 tablet by mouth daily.   Yes [provider]  Omega-3 Fatty Acids (FISH OIL PO) Take 1 capsule by mouth daily.   Yes [provider]  amLODipine  (NORVASC ) 5 MG tablet TAKE 1 TABLET BY MOUTH DAILY 04/28/24   Austine Lefort, MD  rosuvastatin  (CRESTOR ) 20 MG tablet TAKE 1 TABLET BY MOUTH DAILY 04/28/24   Austine Lefort, MD  venlafaxine  XR (EFFEXOR -XR) 150 MG 24 hr capsule TAKE 1 CAPSULE BY MOUTH DAILY  WITH BREAKFAST (NEED APPT WITH  PCP) 04/28/24   Austine Lefort, MD     Critical care time:     Joesph Mussel, DO 05/01/24 10:44 AM [facility]  For contact information, see Amion. If no response to pager, please call PCCM consult pager. After hours, 7PM- 7AM, please call Elink.

## 2024-05-01 NOTE — Plan of Care (Signed)
  Problem: Education: Goal: Knowledge of General Education information will improve Description: Including pain rating scale, medication(s)/side effects and non-pharmacologic comfort measures Outcome: Progressing   Problem: Health Behavior/Discharge Planning: Goal: Ability to manage health-related needs will improve Outcome: Progressing   Problem: Clinical Measurements: Goal: Ability to maintain clinical measurements within normal limits will improve Outcome: Progressing Goal: Will remain free from infection Outcome: Progressing Goal: Diagnostic test results will improve Outcome: Progressing Goal: Respiratory complications will improve Outcome: Progressing Goal: Cardiovascular complication will be avoided Outcome: Progressing   Problem: Activity: Goal: Risk for activity intolerance will decrease Outcome: Progressing   Problem: Nutrition: Goal: Adequate nutrition will be maintained Outcome: Progressing   Problem: Coping: Goal: Level of anxiety will decrease Outcome: Progressing   Problem: Elimination: Goal: Will not experience complications related to bowel motility Outcome: Progressing Goal: Will not experience complications related to urinary retention Outcome: Progressing   Problem: Pain Managment: Goal: General experience of comfort will improve and/or be controlled Outcome: Progressing   Problem: Safety: Goal: Ability to remain free from injury will improve Outcome: Progressing   Problem: Skin Integrity: Goal: Risk for impaired skin integrity will decrease Outcome: Progressing   Problem: Education: Goal: Knowledge of disease or condition will improve Outcome: Progressing Goal: Understanding of medication regimen will improve Outcome: Progressing Goal: Individualized Educational Video(s) Outcome: Progressing   Problem: Activity: Goal: Ability to tolerate increased activity will improve Outcome: Progressing   Problem: Cardiac: Goal: Ability to achieve  and maintain adequate cardiopulmonary perfusion will improve Outcome: Progressing   Problem: Health Behavior/Discharge Planning: Goal: Ability to safely manage health-related needs after discharge will improve Outcome: Progressing   Problem: Education: Goal: Understanding of CV disease, CV risk reduction, and recovery process will improve Outcome: Progressing Goal: Individualized Educational Video(s) Outcome: Progressing   Problem: Activity: Goal: Ability to return to baseline activity level will improve Outcome: Progressing   Problem: Cardiovascular: Goal: Ability to achieve and maintain adequate cardiovascular perfusion will improve Outcome: Progressing Goal: Vascular access site(s) Level 0-1 will be maintained Outcome: Progressing   Problem: Health Behavior/Discharge Planning: Goal: Ability to safely manage health-related needs after discharge will improve Outcome: Progressing   Problem: Education: Goal: Understanding of CV disease, CV risk reduction, and recovery process will improve Outcome: Progressing Goal: Individualized Educational Video(s) Outcome: Progressing   Problem: Activity: Goal: Ability to return to baseline activity level will improve Outcome: Progressing   Problem: Cardiovascular: Goal: Ability to achieve and maintain adequate cardiovascular perfusion will improve Outcome: Progressing Goal: Vascular access site(s) Level 0-1 will be maintained Outcome: Progressing   Problem: Health Behavior/Discharge Planning: Goal: Ability to safely manage health-related needs after discharge will improve Outcome: Progressing

## 2024-05-01 NOTE — CV Procedure (Signed)
   DIRECT CURRENT CARDIOVERSION  NAME:  Jason Fleming    MRN: [id number] DOB:  03/29/1961    ADMIT DATE: 04/26/2024  CARDIOVERSION:     Indications:  Symptomatic Atrial Fibrillation  Informed consent was obtained prior to the procedure. The risks, benefits and alternatives for the procedure were discussed and the patient comprehended these risks.  Risks include, but are not limited to treatment failure, burns to the chest, pain/discomfort, ventricular arrhythmia.   After a procedural time-out, sedation was performed by CCM. The patient had the defibrillator pads placed in the anterior and posterior position. Once an appropriate level of sedation was confirmed, the patient was cardioverted successfully with 200J of biphasic synchronized energy.  The patient converted to NSR. Did require some BVM given apnea post procedure.  The patient had normal neuro status and respiratory status post procedure with vitals stable as recorded elsewhere.  Adequate airway was maintained throughout and vital signs monitored per protocol.  COMPLICATIONS:    Complications: No complications Patient tolerated procedure well.  Arta Lark Advanced Heart Failure 2:43 PM

## 2024-05-01 NOTE — Progress Notes (Signed)
 Advanced Heart Failure Rounding Note  Cardiologist: Maudine Sos, MD  Chief Complaint: Afib Subjective:    Coox 58% CVP 6-7. CO/CI (fick) 5.0/2.1  Off Milrinone .  3.25L UOP (net -1.5L). Weight down 8 lbs Remains in AF, rate 90-130s. Hypertensive overnight.   Overall doing well, remains off milrinone , CVP around 7, coox  58. Good urine output despite no diuretics, BP controlled.  DCCV this morning with CCM. Can downgrade afterwards.    Objective:    Weight Range: 110.7 kg Body mass index is 31.33 kg/m.   Vital Signs:   Temp:  [97.5 F (36.4 C)-98.8 F (37.1 C)] 97.5 F (36.4 C) (06/07 1225) Pulse Rate:  [62-129] 62 (06/07 1330) Resp:  [9-23] 11 (06/07 1330) BP: (77-134)/(66-100) 101/70 (06/07 1330) SpO2:  [76 %-100 %] 97 % (06/07 1330) Weight:  [110.7 kg] 110.7 kg (06/07 0420) Last BM Date : 05/01/24  Weight change: Filed Weights   04/29/24 0500 04/30/24 0500 05/01/24 0420  Weight: 114.6 kg 110.7 kg 110.7 kg   Intake/Output:  Intake/Output Summary (Last 24 hours) at 05/01/2024 1446 Last data filed at 05/01/2024 1300 Gross per 24 hour  Intake 1291.68 ml  Output 2595 ml  Net -1303.32 ml    Physical Exam    CVP 5-6 General: Well appearing. No distress on RA Cardiac: JVP flat, irregular rate and rhythm, systolic murmur Extremities: Warm and dry.  No edema.  Neuro: Alert and oriented x3. Affect pleasant. Moves all extremities without difficulty. Lines/Devices:  RUE PICC  Telemetry   AF 90-130s, now sinus  Labs    CBC Recent Labs    04/30/24 0353 05/01/24 0516  WBC 9.3 11.4*  HGB 11.5* 13.7  HCT 36.3* 44.4  MCV 86.2 87.4  PLT 175 254   Basic Metabolic Panel Recent Labs    21/30/86 0353 05/01/24 0516  NA 137 135  K 3.5 4.5  CL 97* 102  CO2 26 25  GLUCOSE 263* 103*  BUN 14 10  CREATININE 1.07 0.87  CALCIUM  8.0* 8.6*  MG 1.9 2.1   Liver Function Tests Recent Labs    04/30/24 0353 05/01/24 0516  AST 842* 345*  ALT 1,302* 940*   ALKPHOS 52 57  BILITOT 0.8 0.8  PROT 6.4* 6.9  ALBUMIN 3.3* 3.5   BNP (last 3 results) Recent Labs    04/26/24 1157  BNP 367.0*   Hemoglobin A1C No results for input(s): "HGBA1C" in the last 72 hours.  Fasting Lipid Panel No results for input(s): "CHOL", "HDL", "LDLCALC", "TRIG", "CHOLHDL", "LDLDIRECT" in the last 72 hours.   Medications:    Scheduled Medications:  apixaban   5 mg Oral BID   aspirin  EC  81 mg Oral Daily   Chlorhexidine  Gluconate Cloth  6 each Topical Q0600   digoxin   0.125 mg Oral Daily   phenylephrine  200 mcg / ml (NON-ED USE ONLY) injection  100 mcg Intracavernosal Once   polyethylene glycol  17 g Oral BID   sacubitril -valsartan   1 tablet Oral BID   senna-docusate  2 tablet Oral QHS   sodium chloride  flush  10-40 mL Intracatheter Q12H   spironolactone   25 mg Oral Daily   venlafaxine  XR  150 mg Oral Q breakfast    Infusions:  amiodarone  60 mg/hr (05/01/24 1300)    PRN Medications: acetaminophen , ondansetron  (ZOFRAN ) IV, mouth rinse  Patient Profile   63 y.o. male with history of atrial fibrillation 2019, HTN, HLD, elevated coronary calcium  score, prior tobacco use.   Presented with  atrial fibrillation with RVR and acute on chronic CHF. Transferred to [facility] after progressing to cardiogenic shock.  Assessment/Plan   1. Atrial fibrillation: 1 prior episode of AF with DCCV.  Admitted with AF/RVR of uncertain duration.  Initially developed shock after given nodal blocking agents while overloaded for some unclear reason. Improved after stopping these agents.  Unsuccessful TEE/DCCV 6/5, repeat DCCV on 6/7 successful.  - Now NSR - Stop amiodarone  gtt tomorrow, transition to orals - EP referral at discharge for ablation .  2. Acute systolic CHF/cardiogenic shock: Suspect primarily tachymediated, LHC without significant disease. RA 16, PA 43/23 (30), PCWP 23, CO/CI( TD) 6.8/2.8, PVR 1.0, PVR 1.25. - Shock improved, off milrinone  - Start torsemide 40mg   BID - Continue digoxin  0.125mg , spironolactone  25mg  daily - Continue entresto  24/26mg  BID - Consider outpatient CMR if he does not recover  - Eventual AF ablation.   3. AKI: Creatinine up to 1.77 initially, now AKI resolved.   4. Elevated LFTs: shock liver. Improving   Length of Stay: 4  Lauralee Poll, MD  05/01/2024, 2:46 PM

## 2024-05-01 NOTE — Procedures (Signed)
 Sedation note:  11:37 timeout 11:38 propofol  300mg  bolus 11:39 shock at 200J, NSR after Briefly required BVM for desaturation, responded appropriately. Recovered, awake, answering questions after sedation. Monitoring stopped 12:02pm  The patient's heart rate, blood pressure, and oxygen saturation are monitored continuously during the procedure. The period of conscious sedation is 25 minutes, of which I was present face-to-face 100% of this time.  Joesph Mussel, DO 05/01/24 12:12 PM [facility]  For contact information, see Amion. If no response to pager, please call PCCM consult pager. After hours, 7PM- 7AM, please call Elink.

## 2024-05-02 LAB — COMPREHENSIVE METABOLIC PANEL WITH GFR
ALT: 672 U/L — ABNORMAL HIGH (ref 0–44)
AST: 207 U/L — ABNORMAL HIGH (ref 15–41)
Albumin: 3.3 g/dL — ABNORMAL LOW (ref 3.5–5.0)
Alkaline Phosphatase: 52 U/L (ref 38–126)
Anion gap: 11 (ref 5–15)
BUN: 12 mg/dL (ref 8–23)
CO2: 25 mmol/L (ref 22–32)
Calcium: 8.3 mg/dL — ABNORMAL LOW (ref 8.9–10.3)
Chloride: 101 mmol/L (ref 98–111)
Creatinine, Ser: 0.91 mg/dL (ref 0.61–1.24)
GFR, Estimated: >60 mL/min (ref >60)
Glucose, Bld: 160 mg/dL — ABNORMAL HIGH (ref 70–99)
Potassium: 3.8 mmol/L (ref 3.5–5.1)
Sodium: 137 mmol/L (ref 135–145)
Total Bilirubin: 0.9 mg/dL (ref 0.0–1.2)
Total Protein: 6.2 g/dL — ABNORMAL LOW (ref 6.5–8.1)

## 2024-05-02 LAB — COOXEMETRY PANEL
Carboxyhemoglobin: 1.2 % (ref 0.5–1.5)
Methemoglobin: <0.7 % (ref 0.0–1.5)
O2 Saturation: 57.3 %
Total hemoglobin: 14.1 g/dL (ref 12.0–16.0)

## 2024-05-02 LAB — CBC
HCT: 43.9 % (ref 39.0–52.0)
Hemoglobin: 13.7 g/dL (ref 13.0–17.0)
MCH: 27 pg (ref 26.0–34.0)
MCHC: 31.2 g/dL (ref 30.0–36.0)
MCV: 86.4 fL (ref 80.0–100.0)
Platelets: 255 10*3/uL (ref 150–400)
RBC: 5.08 MIL/uL (ref 4.22–5.81)
RDW: 14.8 % (ref 11.5–15.5)
WBC: 10 10*3/uL (ref 4.0–10.5)
nRBC: 0.3 % — ABNORMAL HIGH (ref 0.0–0.2)

## 2024-05-02 LAB — MAGNESIUM: Magnesium: 1.7 mg/dL (ref 1.7–2.4)

## 2024-05-02 LAB — CULTURE, BLOOD (ROUTINE X 2)
Culture: NO GROWTH
Culture: NO GROWTH

## 2024-05-02 MED ORDER — POTASSIUM CHLORIDE CRYS ER 20 MEQ PO TBCR
40.0000 meq | EXTENDED_RELEASE_TABLET | Freq: Once | ORAL | Status: AC
  Administered 2024-05-02: 40 meq via ORAL
  Filled 2024-05-02: qty 2

## 2024-05-02 MED ORDER — AMIODARONE HCL 200 MG PO TABS
400.0000 mg | ORAL_TABLET | Freq: Two times a day (BID) | ORAL | Status: DC
  Administered 2024-05-02 – 2024-05-03 (×3): 400 mg via ORAL
  Filled 2024-05-02 (×3): qty 2

## 2024-05-02 MED ORDER — APIXABAN 5 MG PO TABS
5.0000 mg | ORAL_TABLET | Freq: Two times a day (BID) | ORAL | 11 refills | Status: DC
  Filled 2024-05-02: qty 60, 30d supply, fill #0

## 2024-05-02 MED ORDER — MAGNESIUM SULFATE 2 GM/50ML IV SOLN
2.0000 g | Freq: Once | INTRAVENOUS | Status: AC
  Administered 2024-05-02: 2 g via INTRAVENOUS
  Filled 2024-05-02: qty 50

## 2024-05-02 NOTE — Plan of Care (Signed)
  Problem: Education: Goal: Knowledge of General Education information will improve Description: Including pain rating scale, medication(s)/side effects and non-pharmacologic comfort measures Outcome: Progressing   Problem: Health Behavior/Discharge Planning: Goal: Ability to manage health-related needs will improve Outcome: Progressing   Problem: Clinical Measurements: Goal: Ability to maintain clinical measurements within normal limits will improve Outcome: Progressing Goal: Will remain free from infection Outcome: Progressing Goal: Diagnostic test results will improve Outcome: Progressing Goal: Respiratory complications will improve Outcome: Progressing Goal: Cardiovascular complication will be avoided Outcome: Progressing   Problem: Activity: Goal: Risk for activity intolerance will decrease Outcome: Progressing   Problem: Nutrition: Goal: Adequate nutrition will be maintained Outcome: Progressing   Problem: Coping: Goal: Level of anxiety will decrease Outcome: Progressing   Problem: Elimination: Goal: Will not experience complications related to bowel motility Outcome: Progressing Goal: Will not experience complications related to urinary retention Outcome: Progressing   Problem: Pain Managment: Goal: General experience of comfort will improve and/or be controlled Outcome: Progressing   Problem: Safety: Goal: Ability to remain free from injury will improve Outcome: Progressing   Problem: Skin Integrity: Goal: Risk for impaired skin integrity will decrease Outcome: Progressing   Problem: Education: Goal: Knowledge of disease or condition will improve Outcome: Progressing Goal: Understanding of medication regimen will improve Outcome: Progressing Goal: Individualized Educational Video(s) Outcome: Progressing   Problem: Activity: Goal: Ability to tolerate increased activity will improve Outcome: Progressing   Problem: Cardiac: Goal: Ability to achieve  and maintain adequate cardiopulmonary perfusion will improve Outcome: Progressing   Problem: Health Behavior/Discharge Planning: Goal: Ability to safely manage health-related needs after discharge will improve Outcome: Progressing   Problem: Education: Goal: Understanding of CV disease, CV risk reduction, and recovery process will improve Outcome: Progressing Goal: Individualized Educational Video(s) Outcome: Progressing   Problem: Activity: Goal: Ability to return to baseline activity level will improve Outcome: Progressing   Problem: Cardiovascular: Goal: Ability to achieve and maintain adequate cardiovascular perfusion will improve Outcome: Progressing Goal: Vascular access site(s) Level 0-1 will be maintained Outcome: Progressing   Problem: Health Behavior/Discharge Planning: Goal: Ability to safely manage health-related needs after discharge will improve Outcome: Progressing   Problem: Education: Goal: Understanding of CV disease, CV risk reduction, and recovery process will improve Outcome: Progressing Goal: Individualized Educational Video(s) Outcome: Progressing   Problem: Activity: Goal: Ability to return to baseline activity level will improve Outcome: Progressing   Problem: Cardiovascular: Goal: Ability to achieve and maintain adequate cardiovascular perfusion will improve Outcome: Progressing Goal: Vascular access site(s) Level 0-1 will be maintained Outcome: Progressing   Problem: Health Behavior/Discharge Planning: Goal: Ability to safely manage health-related needs after discharge will improve Outcome: Progressing

## 2024-05-02 NOTE — Plan of Care (Signed)

## 2024-05-02 NOTE — Progress Notes (Signed)
 Advanced Heart Failure Rounding Note  Cardiologist: Maudine Sos, MD  Chief Complaint: Afib Subjective:    Feeling great this morning, remains in NSR. Will stop IV amiodarone  gtt, downgrade. If stable tomorrow can likely d/c. 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    Objective:    Weight Range: 110.1 kg Body mass index is 32.03 kg/m.   Vital Signs:   Temp:  [97.7 F (36.5 C)-98.5 F (36.9 C)] 97.7 F (36.5 C) (06/08 1245) Pulse Rate:  [67-73] 70 (06/08 1245) Resp:  [10-19] 16 (06/08 1245) BP: (82-109)/(63-78) 82/66 (06/08 1245) SpO2:  [92 %-98 %] 96 % (06/08 1245) Weight:  [110.1 kg-110.4 kg] 110.1 kg (06/08 1245) Last BM Date : 05/01/24  Weight change: Filed Weights   05/01/24 0420 05/02/24 0317 05/02/24 1245  Weight: 110.7 kg 110.4 kg 110.1 kg   Intake/Output:  Intake/Output Summary (Last 24 hours) at 05/02/2024 1713 Last data filed at 05/02/2024 1220 Gross per 24 hour  Intake 604.13 ml  Output 3800 ml  Net -3195.87 ml    Physical Exam    General: Well appearing. No distress on RA Cardiac: JVP flat, regular rate and rhythm, systolic murmur Extremities: Warm and dry.  No edema.  Neuro: Alert and oriented x3. Affect pleasant. Moves all extremities without difficulty. Lines/Devices:  RUE PICC  Telemetry   NSR  Labs    CBC Recent Labs    05/01/24 0516 05/02/24 0257  WBC 11.4* 10.0  HGB 13.7 13.7  HCT 44.4 43.9  MCV 87.4 86.4  PLT 254 255   Basic Metabolic Panel Recent Labs    91/47/82 0516 05/02/24 0257  NA 135 137  K 4.5 3.8  CL 102 101  CO2 25 25  GLUCOSE 103* 160*  BUN 10 12  CREATININE 0.87 0.91  CALCIUM  8.6* 8.3*  MG 2.1 1.7   Liver Function Tests Recent Labs    05/01/24 0516 05/02/24 0257  AST 345* 207*  ALT 940* 672*  ALKPHOS 57 52  BILITOT 0.8 0.9  PROT 6.9 6.2*  ALBUMIN 3.5 3.3*   BNP (last 3 results) Recent Labs    04/26/24 1157  BNP 367.0*   Hemoglobin A1C No  results for input(s): "HGBA1C" in the last 72 hours.  Fasting Lipid Panel No results for input(s): "CHOL", "HDL", "LDLCALC", "TRIG", "CHOLHDL", "LDLDIRECT" in the last 72 hours.   Medications:    Scheduled Medications:  amiodarone   400 mg Oral BID   apixaban   5 mg Oral BID   aspirin  EC  81 mg Oral Daily   Chlorhexidine  Gluconate Cloth  6 each Topical Q0600   digoxin   0.125 mg Oral Daily   polyethylene glycol  17 g Oral BID   sacubitril -valsartan   1 tablet Oral BID   senna-docusate  2 tablet Oral QHS   sodium chloride  flush  10-40 mL Intracatheter Q12H   spironolactone   25 mg Oral Daily   torsemide  40 mg Oral BID   venlafaxine  XR  150 mg Oral Q breakfast    Infusions:    PRN Medications: acetaminophen , ondansetron  (ZOFRAN ) IV, mouth rinse  Patient Profile   63 y.o. male with history of atrial fibrillation 2019, HTN, HLD, elevated coronary calcium  score, prior tobacco use.   Presented with atrial fibrillation with RVR and acute on chronic CHF. Transferred to [facility] after progressing to cardiogenic shock.  Assessment/Plan   1. Atrial fibrillation: 1 prior episode of AF with DCCV.  Admitted with AF/RVR of uncertain duration.  Initially developed shock after given nodal blocking agents while overloaded for some unclear reason. Improved after stopping these agents.  Unsuccessful TEE/DCCV 6/5, repeat DCCV on 6/7 successful.  - Now NSR - Stop amiodarone  gtt 6/8, oral amiodarone  tomorrow - EP referral at discharge for ablation .  2. Acute systolic CHF/cardiogenic shock: Suspect primarily tachymediated, LHC without significant disease. RA 16, PA 43/23 (30), PCWP 23, CO/CI( TD) 6.8/2.8, PVR 1.0, PVR 1.25. - Shock improved, off milrinone  - Start torsemide 40mg  daily - Continue digoxin  0.125mg , spironolactone  25mg  daily - Continue entresto  24/26mg  BID - Consider outpatient CMR if he does not recover  - Eventual AF ablation.   3. AKI: Creatinine up to 1.77 initially, now AKI  resolved.   4. Elevated LFTs: shock liver. Improving  Downgrade, discharge tomorrow   Length of Stay: 5  Lauralee Poll, MD  05/02/2024, 5:13 PM

## 2024-05-02 NOTE — Plan of Care (Signed)
  Problem: Clinical Measurements: Goal: Ability to maintain clinical measurements within normal limits will improve Outcome: Progressing Goal: Diagnostic test results will improve Outcome: Progressing   Problem: Activity: Goal: Ability to return to baseline activity level will improve Outcome: Progressing   Problem: Education: Goal: Knowledge of General Education information will improve Description: Including pain rating scale, medication(s)/side effects and non-pharmacologic comfort measures Outcome: Completed/Met   Problem: Health Behavior/Discharge Planning: Goal: Ability to manage health-related needs will improve Outcome: Completed/Met   Problem: Clinical Measurements: Goal: Will remain free from infection Outcome: Completed/Met   Problem: Activity: Goal: Risk for activity intolerance will decrease Outcome: Completed/Met   Problem: Nutrition: Goal: Adequate nutrition will be maintained Outcome: Completed/Met   Problem: Coping: Goal: Level of anxiety will decrease Outcome: Completed/Met   Problem: Elimination: Goal: Will not experience complications related to bowel motility Outcome: Completed/Met Goal: Will not experience complications related to urinary retention Outcome: Completed/Met   Problem: Pain Managment: Goal: General experience of comfort will improve and/or be controlled Outcome: Completed/Met   Problem: Cardiac: Goal: Ability to achieve and maintain adequate cardiopulmonary perfusion will improve Outcome: Completed/Met   Problem: Cardiovascular: Goal: Vascular access site(s) Level 0-1 will be maintained Outcome: Completed/Met

## 2024-05-03 ENCOUNTER — Other Ambulatory Visit (HOSPITAL_COMMUNITY): Payer: Self-pay

## 2024-05-03 ENCOUNTER — Inpatient Hospital Stay (HOSPITAL_COMMUNITY)

## 2024-05-03 ENCOUNTER — Ambulatory Visit: Admitting: Family Medicine

## 2024-05-03 DIAGNOSIS — I340 Nonrheumatic mitral (valve) insufficiency: Secondary | ICD-10-CM

## 2024-05-03 LAB — COMPREHENSIVE METABOLIC PANEL WITH GFR
ALT: 561 U/L — ABNORMAL HIGH (ref 0–44)
AST: 146 U/L — ABNORMAL HIGH (ref 15–41)
Albumin: 3.9 g/dL (ref 3.5–5.0)
Alkaline Phosphatase: 68 U/L (ref 38–126)
Anion gap: 9 (ref 5–15)
BUN: 18 mg/dL (ref 8–23)
CO2: 31 mmol/L (ref 22–32)
Calcium: 9.4 mg/dL (ref 8.9–10.3)
Chloride: 97 mmol/L — ABNORMAL LOW (ref 98–111)
Creatinine, Ser: 1.26 mg/dL — ABNORMAL HIGH (ref 0.61–1.24)
GFR, Estimated: >60 mL/min (ref >60)
Glucose, Bld: 97 mg/dL (ref 70–99)
Potassium: 4.3 mmol/L (ref 3.5–5.1)
Sodium: 137 mmol/L (ref 135–145)
Total Bilirubin: 1.1 mg/dL (ref 0.0–1.2)
Total Protein: 7.4 g/dL (ref 6.5–8.1)

## 2024-05-03 LAB — CBC
HCT: 45.9 % (ref 39.0–52.0)
Hemoglobin: 14.5 g/dL (ref 13.0–17.0)
MCH: 26.7 pg (ref 26.0–34.0)
MCHC: 31.6 g/dL (ref 30.0–36.0)
MCV: 84.5 fL (ref 80.0–100.0)
Platelets: 280 10*3/uL (ref 150–400)
RBC: 5.43 MIL/uL (ref 4.22–5.81)
RDW: 15.1 % (ref 11.5–15.5)
WBC: 8.4 10*3/uL (ref 4.0–10.5)
nRBC: 0 % (ref 0.0–0.2)

## 2024-05-03 LAB — COOXEMETRY PANEL
Carboxyhemoglobin: 1.3 % (ref 0.5–1.5)
Methemoglobin: <0.7 % (ref 0.0–1.5)
O2 Saturation: 55.5 %
Total hemoglobin: 14.7 g/dL (ref 12.0–16.0)

## 2024-05-03 LAB — DIGOXIN LEVEL: Digoxin Level: <0.4 ng/mL — ABNORMAL LOW (ref 0.8–2.0)

## 2024-05-03 LAB — MAGNESIUM: Magnesium: 2.2 mg/dL (ref 1.7–2.4)

## 2024-05-03 MED ORDER — DAPAGLIFLOZIN PROPANEDIOL 10 MG PO TABS
10.0000 mg | ORAL_TABLET | Freq: Every day | ORAL | 5 refills | Status: DC
  Filled 2024-05-03: qty 30, 30d supply, fill #0

## 2024-05-03 MED ORDER — DAPAGLIFLOZIN PROPANEDIOL 10 MG PO TABS: 10.0000 mg | ORAL_TABLET | Freq: Every day | ORAL | Status: DC

## 2024-05-03 MED ORDER — GADOBUTROL 1 MMOL/ML IV SOLN
10.0000 mL | Freq: Once | INTRAVENOUS | Status: AC | PRN
  Administered 2024-05-03: 10 mL via INTRAVENOUS

## 2024-05-03 MED ORDER — SPIRONOLACTONE 25 MG PO TABS
25.0000 mg | ORAL_TABLET | Freq: Every day | ORAL | 5 refills | Status: DC
  Filled 2024-05-03: qty 30, 30d supply, fill #0

## 2024-05-03 MED ORDER — TORSEMIDE 20 MG PO TABS: 20.0000 mg | ORAL_TABLET | Freq: Two times a day (BID) | ORAL | Status: DC

## 2024-05-03 MED ORDER — AMIODARONE HCL 200 MG PO TABS
ORAL_TABLET | ORAL | 0 refills | Status: DC
  Filled 2024-05-03: qty 55, 30d supply, fill #0

## 2024-05-03 MED ORDER — SACUBITRIL-VALSARTAN 24-26 MG PO TABS
1.0000 | ORAL_TABLET | Freq: Two times a day (BID) | ORAL | 5 refills | Status: DC
  Filled 2024-05-03: qty 60, 30d supply, fill #0

## 2024-05-03 MED ORDER — DIGOXIN 125 MCG PO TABS
0.1250 mg | ORAL_TABLET | Freq: Every day | ORAL | 5 refills | Status: DC
  Filled 2024-05-03: qty 30, 30d supply, fill #0

## 2024-05-03 MED ORDER — TORSEMIDE 20 MG PO TABS
20.0000 mg | ORAL_TABLET | Freq: Two times a day (BID) | ORAL | 5 refills | Status: DC
  Filled 2024-05-03: qty 60, 30d supply, fill #0
  Filled 2024-05-03: qty 30, 15d supply, fill #0

## 2024-05-03 NOTE — Progress Notes (Signed)
 PICC Removal Note: PICC line removed from RUE. PICC catheter tip visualized and intact. Pressure dressing applied. No redness, ecchymosis, edema, swelling, or drainage noted at site. Instructions provided on post PICC discharge care, including followup notification instructions.  Bedrest until 1105

## 2024-05-03 NOTE — TOC Progression Note (Signed)
 Transition of Care [facility]) - Progression Note    Patient Details  Name: Jason Fleming MRN: [id number] Date of Birth: Oct 21, 1961  Transition of Care [facility]) CM/SW Contact  Ernst Heap Phone Number: [phone] 05/03/2024, 12:08 PM  Clinical Narrative:   HF CSW called to schedule the patients hospital follow up appointment with PCP for Friday, May 14, 2024 at 10:00 AM.   TOC will continue following.     Expected Discharge Plan: Home/Self Care Barriers to Discharge: Continued Medical Work up  Expected Discharge Plan and Services   Discharge Planning Services: CM Consult   Living arrangements for the past 2 months: Single Family Home                                       Social Determinants of Health (SDOH) Interventions SDOH Screenings   Food Insecurity: No Food Insecurity (04/26/2024)  Housing: Low Risk  (04/26/2024)  Transportation Needs: No Transportation Needs (04/26/2024)  Utilities: Not At Risk (04/26/2024)  Alcohol Screen: Low Risk  (04/25/2024)  Depression (PHQ2-9): Low Risk  (04/26/2024)  Financial Resource Strain: Low Risk  (04/25/2024)  Physical Activity: Inactive (04/25/2024)  Social Connections: Moderately Integrated (04/25/2024)  Stress: Stress Concern Present (04/25/2024)  Tobacco Use: Medium Risk (04/26/2024)  Health Literacy: Adequate Health Literacy (08/13/2023)    Readmission Risk Interventions     No data to display

## 2024-05-03 NOTE — Discharge Summary (Addendum)
 Advanced Heart Failure Team  Discharge Summary   Patient ID: Jason Fleming MRN: [id number], DOB/AGE: Jun 01, 1961 63 y.o. Admit date: 04/26/2024 D/C date:     05/03/2024   Primary Discharge Diagnoses:  Atrial fibrillation Acute systolic heart failure Cardiogenic shock AKI Elevated LFTs   Hospital Course:  Jason Fleming is a 63 y.o. male with hx HTN and a fib. Admitted with SOB. Found to be in a fib RVR with HR in 160s. He was started on diltiazem  gtt. He was weaned off gtt and transitioned to metoprolol . Unfortunately he became hypotensive and required fluid bolus + NE and milrinone . Ultimately in CGS in the setting of diltiazem /metoprolol  for AF RVR. Limited echo showed EF ~30% and moderately reduced RV function. Transferred to [facility] for further workup.   He was started on amiodarone  gtt with rates improved. TEE 6/5 on Dr. Mitzie Anda read: EF 25-30%, RV mildly down, two jets of MR, overall moderate. [facility] 6/5 showed right dominant coronaries with minimal CAD in LAD. RA 16, PA 43/23 (30), PCWP 23, CO/XI( TD) 6.8/2.8, PVR 1.0, PVR 1.25. He was able to wean off NE and milrinone . Diuresed well with IC lasix . He underwent TEE/DCCV 6/5 that was unsuccessful. He was loaded further with amiodarone  and was cardioverted again 6/7 with successful conversion to NRS. Remained in NSR and was transitioned to oral amiodarone . GDMT started.   I have arranged post hospital follow ups in AHF clinic and new patient visit with Dr. Marven Slimmer for possible ablation. Personally called Mr. Lannen to discuss the visits.   cMRI on day of discharge. Read pending.   Pt will continue to be followed closely in the HF clinic. Dr Mitzie Anda evaluated and deemed appropriate for discharge.   See below for detailed problem list: 1. Atrial fibrillation: 1 prior episode of AF with DCCV.  Admitted with AF/RVR of uncertain duration.  Initially developed shock with diltiazem /metorprolol, now off diltiazem /metoprolol  and stable BP. Unsuccessful  TEE/DCCV 6/5, successful DCCV on 6/7.  He remains in NSR today. - Continue amiodarone  400 mg bid x 1 week total then 200 mg bid x 1 week then 200 mg daily.  - Continue apixaban .  - Will need atrial fibrillation ablation eventually. Referral placed and OP visit arranged with EP.  2. Acute systolic CHF/cardiogenic shock: Echo 6/4 was difficult/incomplete study, EF around 30%, moderate RV dysfunction, moderate MR. Cause of cardiomyopathy uncertain. Elevated calcium  score in past in LAD territory so has some degree of CAD.  HS-TnI normal this admission so no ACS. Dyspnea present for 2-3 months, ?long-standing unrecognized AF/RVR with tachycardia-mediated CMP.  He developed shock in setting of diltiazem /metoprolol  for AF/RVR. TEE 6/5 with EF 25-30%, RV mildly down, two jets of MR, overall moderate. [facility] 6/5 showed right dominant coronaries with minimal CAD in LAD. RA 16, PA 43/23 (30), PCWP 23, CO/XI( TD) 6.8/2.8, PVR 1.0, PVR 1.25. Now off milrinone  with co-ox 56%, not volume overloaded on exam. - Continue torsemide 20 mg daily  - Start Farxiga 10 mg daily tomorrow.  - continue Digoxin  0.125 daily - Continue spironolactone  25 daily - Continue Entresto  24/26 mg bid - CMRI today prior to discharge.  - Eventual AF ablation.  3. AKI: Creatinine up to 1.77 initially, now AKI resolved.  Creatinine 1.26 today, mildly higher than yesterday.  - Torsemide decreased today.  4. Elevated LFTs: Suspect shock liver. Improving.     - CMET at follow up. May be able to restart statin then  Discharge Weight Range: 238 lbs Discharge  Vitals: Blood pressure 105/75, pulse 68, temperature 98 F (36.7 C), temperature source Oral, resp. rate 18, height 6\' 1"  (1.854 m), weight 108 kg, SpO2 100%.  Labs: Lab Results  Component Value Date   WBC 8.4 05/03/2024   HGB 14.5 05/03/2024   HCT 45.9 05/03/2024   MCV 84.5 05/03/2024   PLT 280 05/03/2024    Recent Labs  Lab 05/03/24 0624  NA 137  K 4.3  CL 97*  CO2 31  BUN  18  CREATININE 1.26*  CALCIUM  9.4  PROT 7.4  BILITOT 1.1  ALKPHOS 68  ALT 561*  AST 146*  GLUCOSE 97   Lab Results  Component Value Date   CHOL 56 04/28/2024   HDL 19 (L) 04/28/2024   LDLCALC 26 04/28/2024   TRIG 57 04/28/2024   BNP (last 3 results) Recent Labs    04/26/24 1157  BNP 367.0*    ProBNP (last 3 results) No results for input(s): "PROBNP" in the last 8760 hours.   Diagnostic Studies/Procedures  TEE/DCCV 6/5: EF 25-30%, RV mildly down, two jets of MR, overall moderate. Unsuccessful DCCV  [facility] 6/5 showed right dominant coronaries with minimal CAD in LAD. RA 16, PA 43/23 (30), PCWP 23, CO/XI( TD) 6.8/2.8, PVR 1.0, PVR 1.25.   DCCV 6/7: Successful conversion to NSR  Discharge Medications   Allergies as of 05/03/2024   No Known Allergies      Medication List     STOP taking these medications    amLODipine  5 MG tablet Commonly known as: NORVASC    aspirin  EC 81 MG tablet   rosuvastatin  20 MG tablet Commonly known as: Crestor        TAKE these medications    amiodarone  200 MG tablet Commonly known as: PACERONE  Take 2 tablets (400 mg total) by mouth 2 (two) times daily for 6 days, THEN 1 tablet (200 mg total) 2 (two) times daily for 7 days, THEN 1 tablet (200 mg total) daily. Start taking on: May 03, 2024   apixaban  5 MG Tabs tablet Commonly known as: ELIQUIS  Take 1 tablet (5 mg total) by mouth 2 (two) times daily.   dapagliflozin propanediol 10 MG Tabs tablet Commonly known as: FARXIGA Take 1 tablet (10 mg total) by mouth daily. Start taking on: May 04, 2024   digoxin  0.125 MG tablet Commonly known as: LANOXIN  Take 1 tablet (0.125 mg total) by mouth daily. Start taking on: May 04, 2024   FISH OIL PO Take 1 capsule by mouth daily.   multivitamin tablet Take 1 tablet by mouth daily.   sacubitril -valsartan  24-26 MG Commonly known as: ENTRESTO  Take 1 tablet by mouth 2 (two) times daily.   spironolactone  25 MG tablet Commonly  known as: ALDACTONE  Take 1 tablet (25 mg total) by mouth daily. Start taking on: May 04, 2024   torsemide 20 MG tablet Commonly known as: DEMADEX Take 1 tablet (20 mg total) by mouth 2 (two) times daily. Start taking on: May 04, 2024   venlafaxine  XR 150 MG 24 hr capsule Commonly known as: EFFEXOR -XR TAKE 1 CAPSULE BY MOUTH DAILY  WITH BREAKFAST (NEED APPT WITH  PCP) What changed: See the new instructions.       Disposition   The patient will be discharged in stable condition to home. Discharge Instructions     Amb referral to AFIB Clinic   Complete by: As directed           APP Duration of Discharge: 16 minutes  Signed, Alma L  Irving Mantle AGACNP-BC  05/03/2024, 9:57 AM  Patient seen with NP, I formulated the plan and agree with the above note.   Please see my earlier note from today describing my discharge plan.   I spent 32 minutes on this discharge.   [name] 05/03/2024 4:37 PM

## 2024-05-03 NOTE — Progress Notes (Signed)
 Patient ID: Jason Fleming, male   DOB: [birth year], 62 y.o.   MRN: [id number]     Advanced Heart Failure Rounding Note  Cardiologist: Maudine Sos, MD  Chief Complaint: Afib Subjective:    Coox 58% CVP 6-7. CO/CI (fick) 5.0/2.1  Off Milrinone .  3.25L UOP (net -1.5L). Weight down 8 lbs Remains in AF, rate 90-130s. Hypertensive overnight.   Feeling better this am. No dyspnea at rest or orthopnea.   Objective:    Weight Range: 108 kg Body mass index is 31.43 kg/m.   Vital Signs:   Temp:  [97.7 F (36.5 C)-98.9 F (37.2 C)] 98 F (36.7 C) (06/09 0742) Pulse Rate:  [64-70] 68 (06/09 0742) Resp:  [16-20] 18 (06/09 0742) BP: (82-107)/(63-81) 105/75 (06/09 0742) SpO2:  [94 %-100 %] 100 % (06/09 0742) Weight:  [108 kg-110.1 kg] 108 kg (06/09 0437) Last BM Date : 05/02/24  Weight change: Filed Weights   05/02/24 0317 05/02/24 1245 05/03/24 0437  Weight: 110.4 kg 110.1 kg 108 kg   Intake/Output:  Intake/Output Summary (Last 24 hours) at 05/03/2024 0945 Last data filed at 05/03/2024 0812 Gross per 24 hour  Intake 783.14 ml  Output 725 ml  Net 58.14 ml    Physical Exam    CVP 6-7 General: Well appearing. No distress on RA Cardiac: JVP flat. S1 and S2 present. No murmurs or rub. Extremities: Warm and dry.  No edema.  Neuro: Alert and oriented x3. Affect pleasant. Moves all extremities without difficulty. Lines/Devices:  RUE PICC  Telemetry   AF 90-130s (personally reviewed)  EKG    No new to review  Labs    CBC Recent Labs    05/02/24 0257 05/03/24 0624  WBC 10.0 8.4  HGB 13.7 14.5  HCT 43.9 45.9  MCV 86.4 84.5  PLT 255 280   Basic Metabolic Panel Recent Labs    40/98/11 0257 05/03/24 0624  NA 137 137  K 3.8 4.3  CL 101 97*  CO2 25 31  GLUCOSE 160* 97  BUN 12 18  CREATININE 0.91 1.26*  CALCIUM  8.3* 9.4  MG 1.7 2.2   Liver Function Tests Recent Labs    05/02/24 0257 05/03/24 0624  AST 207* 146*  ALT 672* 561*  ALKPHOS 52 68  BILITOT  0.9 1.1  PROT 6.2* 7.4  ALBUMIN 3.3* 3.9   BNP (last 3 results) Recent Labs    04/26/24 1157  BNP 367.0*   Hemoglobin A1C No results for input(s): "HGBA1C" in the last 72 hours.  Fasting Lipid Panel No results for input(s): "CHOL", "HDL", "LDLCALC", "TRIG", "CHOLHDL", "LDLDIRECT" in the last 72 hours.  Imaging   No results found.  Medications:    Scheduled Medications:  amiodarone   400 mg Oral BID   apixaban   5 mg Oral BID   Chlorhexidine  Gluconate Cloth  6 each Topical Q0600   [START ON 05/04/2024] dapagliflozin propanediol  10 mg Oral Daily   digoxin   0.125 mg Oral Daily   polyethylene glycol  17 g Oral BID   sacubitril -valsartan   1 tablet Oral BID   senna-docusate  2 tablet Oral QHS   sodium chloride  flush  10-40 mL Intracatheter Q12H   spironolactone   25 mg Oral Daily   [START ON 05/04/2024] torsemide  20 mg Oral BID   venlafaxine  XR  150 mg Oral Q breakfast    Infusions:    PRN Medications: acetaminophen , ondansetron  (ZOFRAN ) IV, mouth rinse  Patient Profile   63 y.o. male with history of  atrial fibrillation 2019, HTN, HLD, elevated coronary calcium  score, prior tobacco use.   Presented with atrial fibrillation with RVR and acute on chronic CHF. Transferred to [facility] after progressing to cardiogenic shock.  Assessment/Plan   1. Atrial fibrillation: 1 prior episode of AF with DCCV.  Admitted with AF/RVR of uncertain duration.  Initially developed shock with diltiazem /metorprolol, now off diltiazem /metoprolol  and stable BP. Unsuccessful TEE/DCCV 6/5, successful DCCV on 6/7.  He remains in NSR today. - Continue amiodarone  400 mg bid x 1 week total then 200 mg bid x 1 week then 200 mg daily.  - Continue apixaban .  - will need atrial fibrillation ablation eventually, get consult with EP as outpatient.   2. Acute systolic CHF/cardiogenic shock: Echo 6/4 was difficult/incomplete study, EF around 30%, moderate RV dysfunction, moderate MR. Cause of cardiomyopathy  uncertain. Elevated calcium  score in past in LAD territory so has some degree of CAD.  HS-TnI normal this admission so no ACS. Dyspnea present for 2-3 months, ?long-standing unrecognized AF/RVR with tachycardia-mediated CMP.  He developed shock in setting of diltiazem /metoprolol  for AF/RVR. TEE 6/5 with EF 25-30%, RV mildly down, two jets of MR, overall moderate. [facility] 6/5 showed right dominant coronaries with minimal CAD in LAD. RA 16, PA 43/23 (30), PCWP 23, CO/XI( TD) 6.8/2.8, PVR 1.0, PVR 1.25. Now off milrinone  with co-ox 56%, not volume overloaded on exam. - Decrease torsemide to 20 mg daily (starting tomorrow).   - Start Farxiga 10 mg daily tomorrow.  - continue Digoxin  0.125 daily - Continue spironolactone  25 daily - Continue Entresto  24/26 mg bid - CMRI today prior to discharge.  - Eventual AF ablation.  3. AKI: Creatinine up to 1.77 initially, now AKI resolved.  Creatinine 1.26 today, mildly higher than yesterday.  - Cut back torsemide as above.  4. Elevated LFTs: Suspect shock liver. Improving.   Patient can go home today. Will need close followup in CHF clinic. Needs EP consult as outpatient.   [name] 05/03/2024 9:45 AM

## 2024-05-04 NOTE — Progress Notes (Unsigned)
 Electrophysiology Office Note:    Date:  05/05/2024   ID:  Jason Fleming, DOB [birth year], MRN [id number]  [facility] Cardiologist:  Maudine Sos, MD  [facility] Electrophysiologist:  Boyce Byes, MD   Referring MD: Austine Lefort, MD   Chief Complaint: AF  History of Present Illness:    Jason Fleming is a 63yo man who I am seeing today for AF at the request of Dr Cheril Cork.   He was recently hospitalized 6/2 - 6/9 for AF and HF. He has a history of HTN and AF. He was admitted and found to be in AF w/ RVR with a reduced EF (25%). He was started on amiodarone  and underwent TEE guided DCCV on 6/5/20225. The DCCV was not successful and he was DCCV again 6/7. That time, it was successful. He presents today to discuss more durable rhythm control--specifically catheter ablation.  He is doing well today.  He tells me that now that he is in better health and his heart rhythm has returned to normal, he is able to realize how poorly he was doing prior to the hospitalization.  He apparently was very short of breath with decreased exercise tolerance and now much of that has resolved.  He is feeling much more optimistic and improved since leaving the hospital.  He is interested in ablation long-term in an effort to avoid long-term exposure to amiodarone .  He is talked to several people around him who have had catheter ablations.      Their past medical, social and family history was reviewed.   ROS:   Please see the history of present illness.    All other systems reviewed and are negative.  EKGs/Labs/Other Studies Reviewed:    The following studies were reviewed today:  05/03/2024 cMR EF 30 Moderate Jason No LGE  04/27/2024 Echo EF 25-30 RV moderately reduced Moderate Jason  EKG Interpretation Date/Time:  Wednesday May 05 2024 10:47:38 EDT Ventricular Rate:  73 PR Interval:  164 QRS Duration:  92 QT Interval:  408 QTC Calculation: 449 R Axis:   -56  Text  Interpretation: Normal sinus rhythm Left axis deviation Confirmed by Harvie Liner [phone]) on 05/05/2024 10:49:09 AM    Physical Exam:    VS:  BP 90/68 (BP Location: Left Arm)   Pulse 73   Ht 6' 1 (1.854 m)   Wt 240 lb (108.9 kg)   SpO2 97%   BMI 31.66 kg/m     Wt Readings from Last 3 Encounters:  05/05/24 240 lb (108.9 kg)  05/03/24 238 lb 3.2 oz (108 kg)  04/26/24 251 lb 9.6 oz (114.1 kg)     GEN: no distress.  Overweight CARD: RRR, No MRG RESP: No IWOB. CTAB.        ASSESSMENT AND PLAN:    1. Persistent atrial fibrillation (HCC)   2. Chronic systolic heart failure (HCC)   3. Encounter for long-term (current) use of high-risk medication     #AF #High risk med monitoring - amiodarone  Rhythm control indicated given HFrEF. I discussed treatment options including conservative therapy, AAD and ablation. Given his young age, AAD is not an ideal long term strategy. I discussed ablation in detail including the risks and likelihood of success. He would like to proceed.  Discussed treatment options today for AF including antiarrhythmic drug therapy and ablation. Discussed risks, recovery and likelihood of success with each treatment strategy. Risk, benefits, and alternatives to EP study and ablation for afib were discussed. These risks  include but are not limited to stroke, bleeding, vascular damage, tamponade, perforation, damage to the esophagus, lungs, phrenic nerve and other structures, pulmonary vein stenosis, worsening renal function, coronary vasospasm and death.  Discussed potential need for repeat ablation procedures and antiarrhythmic drugs after an initial ablation. The patient understands these risk and wishes to proceed.  We will therefore proceed with catheter ablation at the next available time.  Carto, ICE, anesthesia are requested for the procedure.  Will also obtain CT PV protocol prior to the procedure to exclude LAA thrombus and further evaluate atrial  anatomy.  LFTs trending down. Likely were elevated in setting of low output.  #Chronic systolic heart failure NYHA II-III. EF 35% Rhythm control indicated as above Cont spironolactone , entresto , digoxin , farxiga      Signed, Donelda Fujita T. Marven Slimmer, MD, [facility], [facility] 05/05/2024 11:03 AM    Electrophysiology [facility]

## 2024-05-05 ENCOUNTER — Other Ambulatory Visit: Payer: Self-pay

## 2024-05-05 ENCOUNTER — Ambulatory Visit: Attending: Cardiology | Admitting: Cardiology

## 2024-05-05 VITALS — BP 90/68 | HR 73 | Ht 73.0 in | Wt 240.0 lb

## 2024-05-05 DIAGNOSIS — I5022 Chronic systolic (congestive) heart failure: Secondary | ICD-10-CM

## 2024-05-05 DIAGNOSIS — I4819 Other persistent atrial fibrillation: Secondary | ICD-10-CM

## 2024-05-05 DIAGNOSIS — Z79899 Other long term (current) drug therapy: Secondary | ICD-10-CM | POA: Diagnosis not present

## 2024-05-05 NOTE — Patient Instructions (Addendum)
 Medication Instructions:  Your physician recommends that you continue on your current medications as directed. Please refer to the Current Medication list given to you today.  *If you need a refill on your cardiac medications before your next appointment, please call your pharmacy*  Lab Work: BMET and CBC - you may go to any [facility] location to have these drawn within 30 days of your procedure  Testing/Procedures: Cardiac CT Your physician has requested that you have cardiac CT. Cardiac computed tomography (CT) is a painless test that uses an x-ray machine to take clear, detailed pictures of your heart. For further information please visit [email or web address]  We will call you to schedule your CT scan. It will be done about three weeks prior to your ablation.  Ablation Your physician has recommended that you have an ablation. Catheter ablation is a medical procedure used to treat some cardiac arrhythmias (irregular heartbeats). During catheter ablation, a long, thin, flexible tube is put into a blood vessel in your groin (upper thigh), or neck. This tube is called an ablation catheter. It is then guided to your heart through the blood vessel. Radio frequency waves destroy small areas of heart tissue where abnormal heartbeats may cause an arrhythmia to start. \ You are scheduled for Atrial Fibrillation Ablation on Tuesday, August 5 with Dr. Harvie Liner.Please arrive at the Main Entrance A at [facility]: [address] [place], [place] [address] at 8:00 AM    Follow-Up: At [facility], you and your health needs are our priority.  As part of our continuing mission to provide you with exceptional heart care, we have created designated Provider Care Teams.  These Care Teams include your primary Cardiologist (physician) and Advanced Practice Providers (APPs -  Physician Assistants and Nurse Practitioners) who all work together to provide you with the care you need, when you need  it.   Your next appointment:   We will contact you about your post-procedure follow up appointments.

## 2024-05-10 ENCOUNTER — Ambulatory Visit (HOSPITAL_COMMUNITY)
Admit: 2024-05-10 | Discharge: 2024-05-10 | Disposition: A | Discharge status: Home / Self Care | Attending: Internal Medicine | Admitting: Internal Medicine

## 2024-05-10 ENCOUNTER — Encounter (HOSPITAL_COMMUNITY): Payer: Self-pay

## 2024-05-10 ENCOUNTER — Other Ambulatory Visit (HOSPITAL_COMMUNITY): Payer: Self-pay | Admitting: *Deleted

## 2024-05-10 VITALS — BP 144/78 | HR 74 | Wt 242.0 lb

## 2024-05-10 DIAGNOSIS — I5022 Chronic systolic (congestive) heart failure: Secondary | ICD-10-CM | POA: Insufficient documentation

## 2024-05-10 DIAGNOSIS — Z01818 Encounter for other preprocedural examination: Secondary | ICD-10-CM | POA: Diagnosis not present

## 2024-05-10 DIAGNOSIS — R7989 Other specified abnormal findings of blood chemistry: Secondary | ICD-10-CM | POA: Diagnosis not present

## 2024-05-10 DIAGNOSIS — I4891 Unspecified atrial fibrillation: Secondary | ICD-10-CM | POA: Insufficient documentation

## 2024-05-10 DIAGNOSIS — I4819 Other persistent atrial fibrillation: Secondary | ICD-10-CM

## 2024-05-10 LAB — COMPREHENSIVE METABOLIC PANEL WITH GFR
ALT: 111 U/L — ABNORMAL HIGH (ref 0–44)
AST: 53 U/L — ABNORMAL HIGH (ref 15–41)
Albumin: 4.2 g/dL (ref 3.5–5.0)
Alkaline Phosphatase: 64 U/L (ref 38–126)
Anion gap: 13 (ref 5–15)
BUN: 23 mg/dL (ref 8–23)
CO2: 24 mmol/L (ref 22–32)
Calcium: 9.8 mg/dL (ref 8.9–10.3)
Chloride: 100 mmol/L (ref 98–111)
Creatinine, Ser: 1.78 mg/dL — ABNORMAL HIGH (ref 0.61–1.24)
GFR, Estimated: 43 mL/min — ABNORMAL LOW (ref >60)
Glucose, Bld: 86 mg/dL (ref 70–99)
Potassium: 5.2 mmol/L — ABNORMAL HIGH (ref 3.5–5.1)
Sodium: 137 mmol/L (ref 135–145)
Total Bilirubin: 0.8 mg/dL (ref 0.0–1.2)
Total Protein: 7.8 g/dL (ref 6.5–8.1)

## 2024-05-10 LAB — DIGOXIN LEVEL: Digoxin Level: <0.4 ng/mL — ABNORMAL LOW (ref 0.8–2.0)

## 2024-05-10 LAB — CBC
HCT: 48.5 % (ref 39.0–52.0)
Hemoglobin: 15.4 g/dL (ref 13.0–17.0)
MCH: 26.9 pg (ref 26.0–34.0)
MCHC: 31.8 g/dL (ref 30.0–36.0)
MCV: 84.8 fL (ref 80.0–100.0)
Platelets: 330 10*3/uL (ref 150–400)
RBC: 5.72 MIL/uL (ref 4.22–5.81)
RDW: 14.8 % (ref 11.5–15.5)
WBC: 9.7 10*3/uL (ref 4.0–10.5)
nRBC: 0 % (ref 0.0–0.2)

## 2024-05-10 LAB — BRAIN NATRIURETIC PEPTIDE: B Natriuretic Peptide: 53.4 pg/mL (ref 0.0–100.0)

## 2024-05-10 MED ORDER — AMIODARONE HCL 200 MG PO TABS: 400.0000 mg | ORAL_TABLET | Freq: Two times a day (BID) | ORAL | 1 refills | Status: DC

## 2024-05-10 NOTE — Patient Instructions (Signed)
 Take Amiodarone  400 mg twice daily. Hold your Farxiga  until cardioversion completed - see below. Labs today - will call you if abnormal. Cardioversion scheduled - see below. Return to Heart Failure [facility] Clinic in 2 weeks - see below. Please call us  at [phone] if any questions or concerns prior to your next visit.     Dear Jason Fleming   You are scheduled for a Cardioversion on Friday, June 20 with Dr. Bruce Caper.  Please arrive at the [name] (Main Entrance A) at [facility] at 11:30 am (This time is one hour(s) before your procedure to ensure your preparation).   Free valet parking service is available. You will check in at ADMITTING.   *Please Note: You will receive a call the day before your procedure to confirm the appointment time. That time may have changed from the original time based on the schedule for that day.*   Arrive 1 hour prior to procedure.   DIET:  Nothing to eat or drink after midnight except a sip of water with medications (see medication instructions below)  MEDICATION INSTRUCTIONS:       :1} HOLD: Dapagliflozin  (Farxiga ) for 3 days prior to the procedure. Last dose on Monday, June 16.        :2} Continue taking your anticoagulant (blood thinner): Apixaban  (Eliquis ). You will need to continue              this after your procedure until you are told by your provider that it is safe to stop.    FYI:  For your safety, and to allow us  to monitor your vital signs accurately during the surgery/procedure we request: If you have artificial nails, gel coating, SNS etc, please have those removed prior to your surgery/procedure. Not having the nail coverings /polish removed may result in cancellation or delay of your surgery/procedure.  Your support person will be asked to wait in the waiting room during your procedure.  It is OK to have someone drop you off and come back when you are ready to be discharged.  You  cannot drive after the procedure and will need someone to drive you home.  Bring your insurance cards.  *Special Note: Every effort is made to have your procedure done on time. Occasionally there are emergencies that occur at the hospital that may cause delays. Please be patient if a delay does occur.

## 2024-05-10 NOTE — Progress Notes (Addendum)
 ADVANCED HF CLINIC CONSULT NOTE  Referring Physician: Austine Lefort, MD Primary Care: Austine Lefort, MD Primary Cardiologist: Maudine Sos, MD Heart failure MD: Dr. Mitzie Anda  Chief Complaint:  HPI: [facility] is a 63 y.o. male with hx HTN and a fib.   He was recently admitted with SOB. Found to be in a fib RVR with HR in 160s. He was started on diltiazem  gtt. This was weaned off and transitioned to metoprolol . Unfortunately he became hypotensive and required fluid bolus + NE and milrinone . Ultimately in CGS in the setting of diltiazem /metoprolol  for AF RVR. Limited echo showed EF ~30% and moderately reduced RV function. Transferred to [facility] for further workup. He was then started on amiodarone  gtt with improvement in rates. TEE 6/5 on Dr. Mitzie Anda read: EF 25-30%, RV mildly down, two jets of MR, overall moderate. [facility] 6/5 showed right dominant coronaries with minimal CAD in LAD. RA 16, PA 43/23 (30), PCWP 23, CO/XI( TD) 6.8/2.8, PVR 1.0, PVR 1.25. He was able to wean off NE and milrinone . Diuresed well with IV lasix . He underwent TEE/DCCV 6/5 that was unsuccessful. He was loaded further with amiodarone  and was cardioverted again 6/7 with successful conversion to NRS. Remained in NSR and was transitioned to oral amiodarone . GDMT started. cMRI at discharge showed LVEF 30% with worse function in the septum, RV EF 35%, mod MR.  Today he returns for post hospital follow up. Overall feeling good. Denies palpitations, CP, edema, or PND/Orthopnea. Has dizziness with position changes.  No SOB. Appetite ok. No fever or chills. Weight at home 238-239 pounds. Taking all medications, tapered down his amiodarone  yesterday as prescribed. Denies tobacco or drug use. Seldom ETOH use. SBP 90s at home.   Past Medical History:  Diagnosis Date   Anxiety    Atrial fibrillation (HCC)    Depression    Hypertension    Paroxysmal atrial fibrillation (HCC)    cardioversion in ed 05/2018    Current  Outpatient Medications  Medication Sig Dispense Refill   apixaban  (ELIQUIS ) 5 MG TABS tablet Take 1 tablet (5 mg total) by mouth 2 (two) times daily. 60 tablet 11   dapagliflozin  propanediol (FARXIGA ) 10 MG TABS tablet Take 1 tablet (10 mg total) by mouth daily. 30 tablet 5   digoxin  (LANOXIN ) 0.125 MG tablet Take 1 tablet (0.125 mg total) by mouth daily. 30 tablet 5   Multiple Vitamin (MULTIVITAMIN) tablet Take 1 tablet by mouth daily.     Omega-3 Fatty Acids (FISH OIL PO) Take 1 capsule by mouth daily.     sacubitril -valsartan  (ENTRESTO ) 24-26 MG Take 1 tablet by mouth 2 (two) times daily. 60 tablet 5   spironolactone  (ALDACTONE ) 25 MG tablet Take 1 tablet (25 mg total) by mouth daily. 30 tablet 5   torsemide  (DEMADEX ) 20 MG tablet Take 1 tablet (20 mg total) by mouth 2 (two) times daily. 60 tablet 5   venlafaxine  XR (EFFEXOR -XR) 150 MG 24 hr capsule TAKE 1 CAPSULE BY MOUTH DAILY  WITH BREAKFAST (NEED APPT WITH  PCP) 30 capsule 11   amiodarone  (PACERONE ) 200 MG tablet Take 2 tablets (400 mg total) by mouth 2 (two) times daily. 120 tablet 1   No current facility-administered medications for this encounter.    No Known Allergies    Social History   Socioeconomic History   Marital status: Married    Spouse name: Not on file   Number of children: Not on file   Years of education: Not  on file   Highest education level: 12th grade  Occupational History   Not on file  Tobacco Use   Smoking status: Former    Types: E-cigarettes    Quit date: 03/02/2012    Years since quitting: 12.1   Smokeless tobacco: Never  Vaping Use   Vaping status: Former  Substance and Sexual Activity   Alcohol use: Not Currently    Alcohol/week: 3.0 standard drinks of alcohol    Types: 3 Standard drinks or equivalent per week   Drug use: No   Sexual activity: Yes  Other Topics Concern   Not on file  Social History Narrative   Not on file   Social Drivers of Health   Financial Resource Strain: Low Risk   (04/25/2024)   Overall Financial Resource Strain (CARDIA)    Difficulty of Paying Living Expenses: Not hard at all  Food Insecurity: No Food Insecurity (04/26/2024)   Hunger Vital Sign    Worried About Running Out of Food in the Last Year: Never true    Ran Out of Food in the Last Year: Never true  Transportation Needs: No Transportation Needs (04/26/2024)   PRAPARE - Administrator, Civil Service (Medical): No    Lack of Transportation (Non-Medical): No  Physical Activity: Inactive (04/25/2024)   Exercise Vital Sign    Days of Exercise per Week: 0 days    Minutes of Exercise per Session: 0 min  Stress: Stress Concern Present (04/25/2024)   [employer] of Occupational Health - Occupational Stress Questionnaire    Feeling of Stress : Rather much  Social Connections: Moderately Integrated (04/25/2024)   Social Connection and Isolation Panel    Frequency of Communication with Friends and Family: More than three times a week    Frequency of Social Gatherings with Friends and Family: Twice a week    Attends Religious Services: More than 4 times per year    Active Member of [employer] or Organizations: No    Attends Banker Meetings: Never    Marital Status: Married  Catering manager Violence: Not At Risk (04/26/2024)   Humiliation, Afraid, Rape, and Kick questionnaire    Fear of Current or Ex-Partner: No    Emotionally Abused: No    Physically Abused: No    Sexually Abused: No      Family History  Problem Relation Age of Onset   Anxiety disorder Mother    Hypertension Mother    Depression Father    Heart disease Father    CAD Father        CABG x 3 in 2000   Stroke Maternal Grandfather    Alcohol abuse Maternal Uncle    Alcohol abuse Paternal Uncle    Colon cancer Neg Hx    Colon polyps Neg Hx    Esophageal cancer Neg Hx    Stomach cancer Neg Hx    Rectal cancer Neg Hx     Vitals:   05/10/24 1442  BP: (!) 144/78  Pulse: 74  SpO2: 98%  Weight: 109.8 kg  (242 lb)    PHYSICAL EXAM: General:  well appearing.  No respiratory difficulty. Walked into clinic Neck: supple. JVD flat.  Cor: PMI nondisplaced. Irregular rate & rhythm. No rubs, gallops or murmurs. Lungs: clear Extremities: no cyanosis, clubbing, rash, edema  Neuro: alert & oriented x 3. Moves all 4 extremities w/o difficulty. Affect pleasant.   ECG: a fib RVR 138 bpm (Personally reviewed)    ASSESSMENT & PLAN:  1. Atrial fibrillation RVR: 1 prior episode of AF with DCCV.  Admitted with AF/RVR 6/25 of uncertain duration.  Initially developed shock with diltiazem /metorprolol, now off diltiazem /metoprolol  and stable BP. Unsuccessful TEE/DCCV 6/5, successful DCCV on 6/7.   - Amiodarone  was tapered down to 200 mg BID yesterday. Unfortunately today he is back in a fib RVR. (Was in NSR with Dr. Marven Slimmer last week) Suspect the drop in amiodarone  dose triggered a fib RVR again. Will increase amiodarone  back up to 400 mg BID until post DCCV.  - With recent shock I will set him up for a DCCV later this week. Discussed with Dr. Bruce Caper. He has not missed any Eliquis  doses. He will hold his SGLT2i starting today until post DCCV.  - Continue apixaban . Denies abnormal bleeding.  - He was referred to EP at discharge. Saw Dr. Marven Slimmer 05/05/24: plan for ablation 06/29/24.   Informed Consent   Shared Decision Making/Informed Consent The risks (stroke, cardiac arrhythmias rarely resulting in the need for a temporary or permanent pacemaker, skin irritation or burns and complications associated with conscious sedation including aspiration, arrhythmia, respiratory failure and death), benefits (restoration of normal sinus rhythm) and alternatives of a direct current cardioversion were explained in detail to Mr. Abigail Abler and he agrees to proceed.        2. Recently diagnosed systolic CHF with recent cardiogenic shock: Echo 6/4 was difficult/incomplete study, EF around 30%, moderate RV dysfunction, moderate MR.  Cause of cardiomyopathy uncertain. Elevated calcium  score in past in LAD territory so has some degree of CAD.  HS-TnI normal this admission so no ACS. ?long-standing unrecognized AF/RVR with tachycardia-mediated CMP.  He developed shock in setting of diltiazem /metoprolol  for AF/RVR. TEE 6/5 with EF 25-30%, RV mildly down, two jets of MR, overall moderate. [facility] 6/5 showed right dominant coronaries with minimal CAD in LAD. RA 16, PA 43/23 (30), PCWP 23, CO/CI (TD) 6.8/2.8, PVR 1.0, PVR 1.25. cMRI 6/25 LVEF 30% with worse function in the septum, RV EF 35%, mod MR - NYHA I-II, appears euvolemic - Continue torsemide  20 mg daily  - Continue Farxiga  10 mg daily. Denies GU symptoms - Continue Digoxin  0.125 daily. Check level today.  - Continue spironolactone  25 daily - Continue Entresto  24/26 mg bid - Consider Toprol  XL at follow up, holding off at this time with recent shock though CI was 2.8 - AF ablation scheduled 06/29/24 - DCCV scheduled as above - Would repeat echo post ablation.   3. Elevated LFTs: Suspect shock liver. Improving.     - CMET today. May be able to restart statin then  Follow up 2-3 weeks post DCCV.   Sheryl Donna AGACNP-BC  05/10/24

## 2024-05-10 NOTE — H&P (View-Only) (Signed)
 ADVANCED HF CLINIC CONSULT NOTE  Referring Physician: Austine Lefort, MD Primary Care: Austine Lefort, MD Primary Cardiologist: Maudine Sos, MD Heart failure MD: Dr. Mitzie Anda  Chief Complaint:  HPI: [facility] is a 63 y.o. male with hx HTN and a fib.   He was recently admitted with SOB. Found to be in a fib RVR with HR in 160s. He was started on diltiazem  gtt. This was weaned off and transitioned to metoprolol . Unfortunately he became hypotensive and required fluid bolus + NE and milrinone . Ultimately in CGS in the setting of diltiazem /metoprolol  for AF RVR. Limited echo showed EF ~30% and moderately reduced RV function. Transferred to [facility] for further workup. He was then started on amiodarone  gtt with improvement in rates. TEE 6/5 on Dr. Mitzie Anda read: EF 25-30%, RV mildly down, two jets of MR, overall moderate. [facility] 6/5 showed right dominant coronaries with minimal CAD in LAD. RA 16, PA 43/23 (30), PCWP 23, CO/XI( TD) 6.8/2.8, PVR 1.0, PVR 1.25. He was able to wean off NE and milrinone . Diuresed well with IV lasix . He underwent TEE/DCCV 6/5 that was unsuccessful. He was loaded further with amiodarone  and was cardioverted again 6/7 with successful conversion to NRS. Remained in NSR and was transitioned to oral amiodarone . GDMT started. cMRI at discharge showed LVEF 30% with worse function in the septum, RV EF 35%, mod MR.  Today he returns for post hospital follow up. Overall feeling good. Denies palpitations, CP, edema, or PND/Orthopnea. Has dizziness with position changes.  No SOB. Appetite ok. No fever or chills. Weight at home 238-239 pounds. Taking all medications, tapered down his amiodarone  yesterday as prescribed. Denies tobacco or drug use. Seldom ETOH use. SBP 90s at home.   Past Medical History:  Diagnosis Date   Anxiety    Atrial fibrillation (HCC)    Depression    Hypertension    Paroxysmal atrial fibrillation (HCC)    cardioversion in ed 05/2018    Current  Outpatient Medications  Medication Sig Dispense Refill   apixaban  (ELIQUIS ) 5 MG TABS tablet Take 1 tablet (5 mg total) by mouth 2 (two) times daily. 60 tablet 11   dapagliflozin  propanediol (FARXIGA ) 10 MG TABS tablet Take 1 tablet (10 mg total) by mouth daily. 30 tablet 5   digoxin  (LANOXIN ) 0.125 MG tablet Take 1 tablet (0.125 mg total) by mouth daily. 30 tablet 5   Multiple Vitamin (MULTIVITAMIN) tablet Take 1 tablet by mouth daily.     Omega-3 Fatty Acids (FISH OIL PO) Take 1 capsule by mouth daily.     sacubitril -valsartan  (ENTRESTO ) 24-26 MG Take 1 tablet by mouth 2 (two) times daily. 60 tablet 5   spironolactone  (ALDACTONE ) 25 MG tablet Take 1 tablet (25 mg total) by mouth daily. 30 tablet 5   torsemide  (DEMADEX ) 20 MG tablet Take 1 tablet (20 mg total) by mouth 2 (two) times daily. 60 tablet 5   venlafaxine  XR (EFFEXOR -XR) 150 MG 24 hr capsule TAKE 1 CAPSULE BY MOUTH DAILY  WITH BREAKFAST (NEED APPT WITH  PCP) 30 capsule 11   amiodarone  (PACERONE ) 200 MG tablet Take 2 tablets (400 mg total) by mouth 2 (two) times daily. 120 tablet 1   No current facility-administered medications for this encounter.    No Known Allergies    Social History   Socioeconomic History   Marital status: Married    Spouse name: Not on file   Number of children: Not on file   Years of education: Not  on file   Highest education level: 12th grade  Occupational History   Not on file  Tobacco Use   Smoking status: Former    Types: E-cigarettes    Quit date: 03/02/2012    Years since quitting: 12.1   Smokeless tobacco: Never  Vaping Use   Vaping status: Former  Substance and Sexual Activity   Alcohol use: Not Currently    Alcohol/week: 3.0 standard drinks of alcohol    Types: 3 Standard drinks or equivalent per week   Drug use: No   Sexual activity: Yes  Other Topics Concern   Not on file  Social History Narrative   Not on file   Social Drivers of Health   Financial Resource Strain: Low Risk   (04/25/2024)   Overall Financial Resource Strain (CARDIA)    Difficulty of Paying Living Expenses: Not hard at all  Food Insecurity: No Food Insecurity (04/26/2024)   Hunger Vital Sign    Worried About Running Out of Food in the Last Year: Never true    Ran Out of Food in the Last Year: Never true  Transportation Needs: No Transportation Needs (04/26/2024)   PRAPARE - Administrator, Civil Service (Medical): No    Lack of Transportation (Non-Medical): No  Physical Activity: Inactive (04/25/2024)   Exercise Vital Sign    Days of Exercise per Week: 0 days    Minutes of Exercise per Session: 0 min  Stress: Stress Concern Present (04/25/2024)   [employer] of Occupational Health - Occupational Stress Questionnaire    Feeling of Stress : Rather much  Social Connections: Moderately Integrated (04/25/2024)   Social Connection and Isolation Panel    Frequency of Communication with Friends and Family: More than three times a week    Frequency of Social Gatherings with Friends and Family: Twice a week    Attends Religious Services: More than 4 times per year    Active Member of [employer] or Organizations: No    Attends Banker Meetings: Never    Marital Status: Married  Catering manager Violence: Not At Risk (04/26/2024)   Humiliation, Afraid, Rape, and Kick questionnaire    Fear of Current or Ex-Partner: No    Emotionally Abused: No    Physically Abused: No    Sexually Abused: No      Family History  Problem Relation Age of Onset   Anxiety disorder Mother    Hypertension Mother    Depression Father    Heart disease Father    CAD Father        CABG x 3 in 2000   Stroke Maternal Grandfather    Alcohol abuse Maternal Uncle    Alcohol abuse Paternal Uncle    Colon cancer Neg Hx    Colon polyps Neg Hx    Esophageal cancer Neg Hx    Stomach cancer Neg Hx    Rectal cancer Neg Hx     Vitals:   05/10/24 1442  BP: (!) 144/78  Pulse: 74  SpO2: 98%  Weight: 109.8 kg  (242 lb)    PHYSICAL EXAM: General:  well appearing.  No respiratory difficulty. Walked into clinic Neck: supple. JVD flat.  Cor: PMI nondisplaced. Irregular rate & rhythm. No rubs, gallops or murmurs. Lungs: clear Extremities: no cyanosis, clubbing, rash, edema  Neuro: alert & oriented x 3. Moves all 4 extremities w/o difficulty. Affect pleasant.   ECG: a fib RVR 138 bpm (Personally reviewed)    ASSESSMENT & PLAN:  1. Atrial fibrillation RVR: 1 prior episode of AF with DCCV.  Admitted with AF/RVR 6/25 of uncertain duration.  Initially developed shock with diltiazem /metorprolol, now off diltiazem /metoprolol  and stable BP. Unsuccessful TEE/DCCV 6/5, successful DCCV on 6/7.   - Amiodarone  was tapered down to 200 mg BID yesterday. Unfortunately today he is back in a fib RVR. (Was in NSR with Dr. Marven Slimmer last week) Suspect the drop in amiodarone  dose triggered a fib RVR again. Will increase amiodarone  back up to 400 mg BID until post DCCV.  - With recent shock I will set him up for a DCCV later this week. Discussed with Dr. Bruce Caper. He has not missed any Eliquis  doses. He will hold his SGLT2i starting today until post DCCV.  - Continue apixaban . Denies abnormal bleeding.  - He was referred to EP at discharge. Saw Dr. Marven Slimmer 05/05/24: plan for ablation 06/29/24.   Informed Consent   Shared Decision Making/Informed Consent The risks (stroke, cardiac arrhythmias rarely resulting in the need for a temporary or permanent pacemaker, skin irritation or burns and complications associated with conscious sedation including aspiration, arrhythmia, respiratory failure and death), benefits (restoration of normal sinus rhythm) and alternatives of a direct current cardioversion were explained in detail to Mr. Abigail Abler and he agrees to proceed.        2. Recently diagnosed systolic CHF with recent cardiogenic shock: Echo 6/4 was difficult/incomplete study, EF around 30%, moderate RV dysfunction, moderate MR.  Cause of cardiomyopathy uncertain. Elevated calcium  score in past in LAD territory so has some degree of CAD.  HS-TnI normal this admission so no ACS. ?long-standing unrecognized AF/RVR with tachycardia-mediated CMP.  He developed shock in setting of diltiazem /metoprolol  for AF/RVR. TEE 6/5 with EF 25-30%, RV mildly down, two jets of MR, overall moderate. [facility] 6/5 showed right dominant coronaries with minimal CAD in LAD. RA 16, PA 43/23 (30), PCWP 23, CO/CI (TD) 6.8/2.8, PVR 1.0, PVR 1.25. cMRI 6/25 LVEF 30% with worse function in the septum, RV EF 35%, mod MR - NYHA I-II, appears euvolemic - Continue torsemide  20 mg daily  - Continue Farxiga  10 mg daily. Denies GU symptoms - Continue Digoxin  0.125 daily. Check level today.  - Continue spironolactone  25 daily - Continue Entresto  24/26 mg bid - Consider Toprol  XL at follow up, holding off at this time with recent shock though CI was 2.8 - AF ablation scheduled 06/29/24 - DCCV scheduled as above - Would repeat echo post ablation.   3. Elevated LFTs: Suspect shock liver. Improving.     - CMET today. May be able to restart statin then  Follow up 2-3 weeks post DCCV.   Sheryl Donna AGACNP-BC  05/10/24

## 2024-05-11 ENCOUNTER — Ambulatory Visit (HOSPITAL_COMMUNITY): Payer: Self-pay | Admitting: Internal Medicine

## 2024-05-11 DIAGNOSIS — I5022 Chronic systolic (congestive) heart failure: Secondary | ICD-10-CM

## 2024-05-13 ENCOUNTER — Encounter: Payer: Self-pay | Admitting: Family Medicine

## 2024-05-13 ENCOUNTER — Ambulatory Visit: Admitting: Family Medicine

## 2024-05-13 VITALS — BP 138/78 | HR 55 | Temp 98.1°F | Ht 73.0 in | Wt 242.0 lb

## 2024-05-13 DIAGNOSIS — G4734 Idiopathic sleep related nonobstructive alveolar hypoventilation: Secondary | ICD-10-CM | POA: Diagnosis not present

## 2024-05-13 DIAGNOSIS — I428 Other cardiomyopathies: Secondary | ICD-10-CM

## 2024-05-13 DIAGNOSIS — I4891 Unspecified atrial fibrillation: Secondary | ICD-10-CM

## 2024-05-13 DIAGNOSIS — I10 Essential (primary) hypertension: Secondary | ICD-10-CM | POA: Diagnosis not present

## 2024-05-13 MED ORDER — SPIRONOLACTONE 25 MG PO TABS: 12.5000 mg | ORAL_TABLET | Freq: Every day | ORAL | Status: DC

## 2024-05-13 MED ORDER — TORSEMIDE 20 MG PO TABS: 20.0000 mg | ORAL_TABLET | Freq: Every day | ORAL | 3 refills | Status: DC | PRN

## 2024-05-13 NOTE — Progress Notes (Addendum)
 Pt called for pre procedure instructions. Arrival time 1130 NPO after midnight explained Instructed to take am meds with sip of water and confirmed blood thinner consistency. Instructed pt need for ride home tomorrow and have responsible adult with them for 24 hrs post procedure.

## 2024-05-13 NOTE — Progress Notes (Signed)
 Subjective:     Patient ID: Jason Fleming, male   DOB: [birth year], 63 y.o.   MRN: [id number]  Patient was recently admitted to the hospital with atrial fibrillation with rapid ventricular response.  Initially they started him on Cardizem  drip and transition to metoprolol .  However the patient had profound hypotension and cardiogenic shock.  He was transferred to [facility]  Echocardiogram revealed ejection fraction of 25 to 30%.  At that point he was transitioned to amiodarone  and digoxin  for rate control.  Cardioversion was performed the patient was discharged in normal sinus rhythm.  He was started on spironolactone  and Entresto  for heart failure given the suppressed ejection fraction.  At his recent heart doctor follow-up, the patient was found to be back in atrial fibrillation.  He is in atrial fibrillation today.  His potassium was found to be elevated at 5.2 so cardiology recommended holding his Entresto  and spironolactone  for 48 hours and then resuming Entresto  at his current dose but resuming spironolactone  12.5 mg a day.  Patient has yet to resume Entresto  or the spironolactone .  He is scheduled for a cardioversion tomorrow overall he is feeling better.  There is no evidence of fluid overload on exam.  Specifically there is no peripheral edema.  Patient states that while in the hospital he was observed to have hypoxia at night while sleeping.  Nurse mentioned that she heard him stop breathing at night while sleeping.  He reports that he is a loud snorer.  He also reports hypersomnolence. Past Medical History:  Diagnosis Date   Anxiety    Atrial fibrillation [facility])    Depression    Hypertension    Paroxysmal atrial fibrillation [facility])    cardioversion in ed 05/2018   Past Surgical History:  Procedure Laterality Date   CARDIOVERSION N/A 04/29/2024   Procedure: CARDIOVERSION;  Surgeon: Darlis Eisenmenger, MD;  Location: [facility] INVASIVE CV LAB;  Service: Cardiovascular;  Laterality: N/A;   CARPAL  TUNNEL RELEASE     COLONOSCOPY  2013   Dr.Hung-normal exam   MASS EXCISION  05/26/2012   Procedure: MINOR EXCISION OF MASS;  Surgeon: Amelie Baize., MD;  Location: [facility];  Service: Orthopedics;  Laterality: Left;   RIGHT/LEFT HEART CATH AND CORONARY ANGIOGRAPHY N/A 04/28/2024   Procedure: RIGHT/LEFT HEART CATH AND CORONARY ANGIOGRAPHY;  Surgeon: Darlis Eisenmenger, MD;  Location: [facility] INVASIVE CV LAB;  Service: Cardiovascular;  Laterality: N/A;   TONSILLECTOMY     TRANSESOPHAGEAL ECHOCARDIOGRAM (CATH LAB) N/A 04/29/2024   Procedure: TRANSESOPHAGEAL ECHOCARDIOGRAM;  Surgeon: Darlis Eisenmenger, MD;  Location: [facility] INV[facility] CV LAB;  Service: Cardiovascular;  Laterality: N/A;   ULNAR NERVE REPAIR Bilateral    WISDOM TOOTH EXTRACTION     Current Outpatient Medications on File Prior to Visit  Medication Sig Dispense Refill   amiodarone  (PACERONE ) 200 MG tablet Take 2 tablets (400 mg total) by mouth 2 (two) times daily. 120 tablet 1   apixaban  (ELIQUIS ) 5 MG TABS tablet Take 1 tablet (5 mg total) by mouth 2 (two) times daily. 60 tablet 11   dapagliflozin  propanediol (FARXIGA ) 10 MG TABS tablet Take 1 tablet (10 mg total) by mouth daily. 30 tablet 5   digoxin  (LANOXIN ) 0.125 MG tablet Take 1 tablet (0.125 mg total) by mouth daily. 30 tablet 5   Multiple Vitamin (MULTIVITAMIN) tablet Take 1 tablet by mouth daily.     Omega-3 Fatty Acids (FISH OIL PO) Take 1 capsule by mouth daily.  sacubitril -valsartan  (ENTRESTO ) 24-26 MG Take 1 tablet by mouth 2 (two) times daily. 60 tablet 5   spironolactone  (ALDACTONE ) 25 MG tablet Take 1 tablet (25 mg total) by mouth daily. 30 tablet 5   torsemide  (DEMADEX ) 20 MG tablet Take 1 tablet (20 mg total) by mouth 2 (two) times daily. 60 tablet 5   venlafaxine  XR (EFFEXOR -XR) 150 MG 24 hr capsule TAKE 1 CAPSULE BY MOUTH DAILY  WITH BREAKFAST (NEED APPT WITH  PCP) 30 capsule 11   No current facility-administered medications on file prior to visit.    No Known Allergies Social History   Socioeconomic History   Marital status: Married    Spouse name: Not on file   Number of children: Not on file   Years of education: Not on file   Highest education level: 12th grade  Occupational History   Not on file  Tobacco Use   Smoking status: Former    Current packs/day: 0.00    Types: E-cigarettes, Cigarettes    Quit date: 03/02/2012    Years since quitting: 12.2   Smokeless tobacco: Never  Vaping Use   Vaping status: Former  Substance and Sexual Activity   Alcohol use: Yes    Alcohol/week: 3.0 standard drinks of alcohol    Types: 3 Cans of beer per week   Drug use: No   Sexual activity: Not Currently    Birth control/protection: None  Other Topics Concern   Not on file  Social History Narrative   Not on file   Social Drivers of Health   Financial Resource Strain: Low Risk  (04/25/2024)   Overall Financial Resource Strain (CARDIA)    Difficulty of Paying Living Expenses: Not hard at all  Food Insecurity: No Food Insecurity (04/26/2024)   Hunger Vital Sign    Worried About Running Out of Food in the Last Year: Never true    Ran Out of Food in the Last Year: Never true  Transportation Needs: No Transportation Needs (04/26/2024)   PRAPARE - Administrator, Civil Service (Medical): No    Lack of Transportation (Non-Medical): No  Physical Activity: Inactive (04/25/2024)   Exercise Vital Sign    Days of Exercise per Week: 0 days    Minutes of Exercise per Session: 0 min  Stress: Stress Concern Present (04/25/2024)   [employer] of Occupational Health - Occupational Stress Questionnaire    Feeling of Stress : Rather much  Social Connections: Moderately Integrated (04/25/2024)   Social Connection and Isolation Panel    Frequency of Communication with Friends and Family: More than three times a week    Frequency of Social Gatherings with Friends and Family: Twice a week    Attends Religious Services: More than 4 times  per year    Active Member of [employer] or Organizations: No    Attends Banker Meetings: Never    Marital Status: Married  Catering manager Violence: Not At Risk (04/26/2024)   Humiliation, Afraid, Rape, and Kick questionnaire    Fear of Current or Ex-Partner: No    Emotionally Abused: No    Physically Abused: No    Sexually Abused: No   Family History  Problem Relation Age of Onset   Anxiety disorder Mother    Hypertension Mother    Depression Father    Heart disease Father    CAD Father        CABG x 3 in 2000   Stroke Maternal Grandfather    Alcohol  abuse Maternal Uncle    Alcohol abuse Paternal Uncle    Colon cancer Neg Hx    Colon polyps Neg Hx    Esophageal cancer Neg Hx    Stomach cancer Neg Hx    Rectal cancer Neg Hx     Review of Systems  Gastrointestinal:  Positive for abdominal pain.  All other systems reviewed and are negative.      Objective:   Physical Exam Vitals reviewed.  Constitutional:      General: He is not in acute distress.    Appearance: Normal appearance. He is well-developed. He is not ill-appearing, toxic-appearing or diaphoretic.  HENT:     Head: Normocephalic and atraumatic.   Eyes:     Conjunctiva/sclera: Conjunctivae normal.     Pupils: Pupils are equal, round, and reactive to light.   Neck:     Thyroid : No thyromegaly.     Vascular: No JVD.   Cardiovascular:     Rate and Rhythm: Normal rate. Rhythm irregular.     Heart sounds: No murmur heard.    No friction rub. No gallop.  Pulmonary:     Effort: Pulmonary effort is normal. No respiratory distress.     Breath sounds: Normal breath sounds. No stridor. No wheezing or rales.  Chest:     Chest wall: No tenderness.  Abdominal:     General: Bowel sounds are normal. There is no distension.     Palpations: Abdomen is soft. There is no mass.     Tenderness: There is no abdominal tenderness. There is no guarding or rebound.     Hernia: No hernia is present.    Musculoskeletal:     Right lower leg: No edema.     Left lower leg: No edema.   Skin:    Findings: No rash.   Neurological:     Mental Status: He is alert and oriented to person, place, and time.     Cranial Nerves: No cranial nerve deficit.     Motor: No abnormal muscle tone.     Coordination: Coordination normal.     Deep Tendon Reflexes: Reflexes normal.   Psychiatric:        Mood and Affect: Mood normal.        Behavior: Behavior normal.      Assessment:     Nocturnal hypoxia - Plan: Ambulatory referral to Sleep Studies  Nonischemic cardiomyopathy (HCC) - Plan: Comprehensive metabolic panel with GFR  Atrial fibrillation, unspecified type (HCC)   Plan:  Patient is in atrial fibrillation however he is appropriately anticoagulated and his rate is controlled.  Continue amiodarone  and digoxin  for rate control.  He is scheduled for cardioversion tomorrow.  He is also possibly scheduled for an ablation in 1 month.  I believe that his cardiomyopathy is likely the trigger that caused his atrial fibrillation.  Therefore I feel that it is important he resume the Entresto  and spironolactone  when possible.  I will repeat his potassium and renal unction today.  His potassium is better we will plan to resume the Entresto  and the reduced dose spironolactone  tomorrow.  Meanwhile, I will refer the patient for a sleep study especially given his nocturnal hypoxia

## 2024-05-14 ENCOUNTER — Ambulatory Visit (HOSPITAL_COMMUNITY): Admitting: Anesthesiology

## 2024-05-14 ENCOUNTER — Inpatient Hospital Stay: Admitting: Family Medicine

## 2024-05-14 ENCOUNTER — Other Ambulatory Visit: Payer: Self-pay | Admitting: Family Medicine

## 2024-05-14 ENCOUNTER — Other Ambulatory Visit: Payer: Self-pay

## 2024-05-14 ENCOUNTER — Ambulatory Visit: Payer: Self-pay | Admitting: Family Medicine

## 2024-05-14 ENCOUNTER — Ambulatory Visit (HOSPITAL_COMMUNITY)
Admission: RE | Admit: 2024-05-14 | Discharge: 2024-05-14 | Disposition: A | Discharge status: Home / Self Care | Attending: Cardiology | Admitting: Cardiology

## 2024-05-14 ENCOUNTER — Encounter (HOSPITAL_COMMUNITY)
Admission: RE | Disposition: A | Discharge status: Home / Self Care | Payer: Self-pay | Source: Home / Self Care | Attending: Cardiology

## 2024-05-14 DIAGNOSIS — I4819 Other persistent atrial fibrillation: Secondary | ICD-10-CM | POA: Insufficient documentation

## 2024-05-14 DIAGNOSIS — Z87891 Personal history of nicotine dependence: Secondary | ICD-10-CM | POA: Diagnosis not present

## 2024-05-14 DIAGNOSIS — I10 Essential (primary) hypertension: Secondary | ICD-10-CM | POA: Diagnosis not present

## 2024-05-14 DIAGNOSIS — Z7901 Long term (current) use of anticoagulants: Secondary | ICD-10-CM | POA: Insufficient documentation

## 2024-05-14 DIAGNOSIS — I110 Hypertensive heart disease with heart failure: Secondary | ICD-10-CM | POA: Diagnosis not present

## 2024-05-14 DIAGNOSIS — N289 Disorder of kidney and ureter, unspecified: Secondary | ICD-10-CM

## 2024-05-14 DIAGNOSIS — I4891 Unspecified atrial fibrillation: Secondary | ICD-10-CM | POA: Diagnosis not present

## 2024-05-14 DIAGNOSIS — Z79899 Other long term (current) drug therapy: Secondary | ICD-10-CM | POA: Diagnosis not present

## 2024-05-14 DIAGNOSIS — I4892 Unspecified atrial flutter: Secondary | ICD-10-CM | POA: Diagnosis not present

## 2024-05-14 DIAGNOSIS — I5020 Unspecified systolic (congestive) heart failure: Secondary | ICD-10-CM | POA: Diagnosis not present

## 2024-05-14 DIAGNOSIS — I5022 Chronic systolic (congestive) heart failure: Secondary | ICD-10-CM

## 2024-05-14 HISTORY — PX: CARDIOVERSION: EP1203

## 2024-05-14 LAB — COMPREHENSIVE METABOLIC PANEL WITH GFR
AG Ratio: 1.5 (calc) (ref 1.0–2.5)
ALT: 63 U/L — ABNORMAL HIGH (ref 9–46)
AST: 28 U/L (ref 10–35)
Albumin: 4.4 g/dL (ref 3.6–5.1)
Alkaline phosphatase (APISO): 72 U/L (ref 35–144)
BUN/Creatinine Ratio: 19 (calc) (ref 6–22)
BUN: 36 mg/dL — ABNORMAL HIGH (ref 7–25)
CO2: 28 mmol/L (ref 20–32)
Calcium: 9.4 mg/dL (ref 8.6–10.3)
Chloride: 99 mmol/L (ref 98–110)
Creat: 1.91 mg/dL — ABNORMAL HIGH (ref 0.70–1.35)
Globulin: 2.9 g/dL (ref 1.9–3.7)
Glucose, Bld: 120 mg/dL — ABNORMAL HIGH (ref 65–99)
Potassium: 4.3 mmol/L (ref 3.5–5.3)
Sodium: 138 mmol/L (ref 135–146)
Total Bilirubin: 0.4 mg/dL (ref 0.2–1.2)
Total Protein: 7.3 g/dL (ref 6.1–8.1)
eGFR: 39 mL/min/{1.73_m2} — ABNORMAL LOW (ref >=60)

## 2024-05-14 SURGERY — CARDIOVERSION (CATH LAB)
Anesthesia: General

## 2024-05-14 MED ORDER — SODIUM CHLORIDE 0.9% FLUSH
INTRAVENOUS | Status: DC | PRN
Start: 2024-05-14 — End: 2024-05-14
  Administered 2024-05-14: 3 mL via INTRAVENOUS

## 2024-05-14 MED ORDER — SODIUM CHLORIDE 0.9 % IV SOLN: INTRAVENOUS | Status: DC

## 2024-05-14 MED ORDER — LIDOCAINE 2% (20 MG/ML) 5 ML SYRINGE
INTRAMUSCULAR | Status: DC | PRN
  Administered 2024-05-14: 100 mg via INTRAVENOUS

## 2024-05-14 MED ORDER — PROPOFOL 10 MG/ML IV BOLUS
INTRAVENOUS | Status: DC | PRN
  Administered 2024-05-14: 80 mg via INTRAVENOUS

## 2024-05-14 SURGICAL SUPPLY — 1 items: PAD DEFIB RADIO PHYSIO CONN (PAD) ×2 IMPLANT

## 2024-05-14 NOTE — Transfer of Care (Signed)
 Immediate Anesthesia Transfer of Care Note  Patient: Jason Fleming  Procedure(s) Performed: CARDIOVERSION  Patient Location: Cath Lab  Anesthesia Type:General  Level of Consciousness: drowsy and responds to stimulation  Airway & Oxygen Therapy: Patient Spontanous Breathing and Patient connected to nasal cannula oxygen  Post-op Assessment: Report given to RN, Post -op Vital signs reviewed and stable, and Patient moving all extremities X 4  Post vital signs: Reviewed and stable  Last Vitals:  Vitals Value Taken Time  BP 144/95 05/14/2024 1301  Temp    Pulse 76 05/14/2024 1301  Resp 13 05/14/2024 1301  SpO2 98% 05/14/2024 1301    Last Pain:  Vitals:   05/14/24 1244  TempSrc:   PainSc: 0-No pain         Complications: No notable events documented.

## 2024-05-14 NOTE — Interval H&P Note (Signed)
 History and Physical Interval Note:  05/14/2024 12:40 PM  [name]  has presented today for surgery, with the diagnosis of AFIB.  The various methods of treatment have been discussed with the patient and family. After consideration of risks, benefits and other options for treatment, the patient has consented to  Procedure(s): CARDIOVERSION (N/A) as a surgical intervention.  The patient's history has been reviewed, patient examined, no change in status, stable for surgery.  I have reviewed the patient's chart and labs.  Questions were answered to the patient's satisfaction.     Jason Fleming

## 2024-05-14 NOTE — CV Procedure (Signed)
   TRANSESOPHAGEAL ECHOCARDIOGRAM GUIDED DIRECT CURRENT CARDIOVERSION  NAME:  Jason Fleming    MRN: [id number] DOB:  10-31-1961    ADMIT DATE: 05/14/2024   CARDIOVERSION:     Indications:  Symptomatic Atrial Fibrillation  Procedure Details:   Patient on uninterrupted anticoagulation at home.   The patient had the defibrillator pads placed in the anterior and posterior position. Once an appropriate level of sedation was confirmed, the patient was cardioverted x 2 with 200J of biphasic synchronized energy followed by 300J.  The patient converted to NSR.  There were no apparent complications.  The patient had normal neuro status and respiratory status post procedure with vitals stable as recorded elsewhere.  Adequate airway was maintained throughout and vital signs monitored per protocol. Sedation by anesthesia.   No complications.   [name] [facility] 12:52 PM

## 2024-05-14 NOTE — Anesthesia Preprocedure Evaluation (Signed)
 Anesthesia Evaluation  Patient identified by MRN, date of birth, ID band Patient awake    Reviewed: Allergy & Precautions, H&P , NPO status , Patient's Chart, lab work & pertinent test results  Airway Mallampati: II  TM Distance: >3 FB Neck ROM: Full    Dental no notable dental hx.    Pulmonary neg pulmonary ROS, former smoker   Pulmonary exam normal breath sounds clear to auscultation       Cardiovascular hypertension, +CHF  Normal cardiovascular exam+ dysrhythmias Atrial Fibrillation  Rhythm:Regular Rate:Normal  Admitted in cardiogenic shock  IMPRESSIONS     1. Left ventricular ejection fraction, by estimation, is 25 to 30%. The  left ventricle has moderately decreased function. Left ventricular  endocardial border not optimally defined to evaluate regional wall motion.  Left ventricular diastolic parameters  are indeterminate.   2. RV not well visualized. Appears enlarged with roughly moderate  systolic dysfunction. Indeterminate PASP, IVC not visualized. . Right  ventricular systolic function was not well visualized. The right  ventricular size is not well visualized.   3. The mitral valve is abnormal. Moderate mitral valve regurgitation. No  evidence of mitral stenosis.   4. The aortic valve is tricuspid. Aortic valve regurgitation is not  visualized. No aortic stenosis is present.   5. Limited study, patient uncomfortable with positioning and asked to end  study.     Neuro/Psych neg Seizures PSYCHIATRIC DISORDERS Anxiety Depression    negative neurological ROS     GI/Hepatic negative GI ROS, Neg liver ROS,neg GERD  ,,  Endo/Other  negative endocrine ROSneg diabetes    Renal/GU negative Renal ROS  negative genitourinary   Musculoskeletal negative musculoskeletal ROS (+)    Abdominal   Peds negative pediatric ROS (+)  Hematology negative hematology ROS (+)   Anesthesia Other Findings    Reproductive/Obstetrics negative OB ROS                             Anesthesia Physical Anesthesia Plan  ASA: 3  Anesthesia Plan: General   Post-op Pain Management:    Induction: Intravenous  PONV Risk Score and Plan: 2 and Propofol  infusion and Treatment may vary due to age or medical condition  Airway Management Planned: Natural Airway and Mask  Additional Equipment:   Intra-op Plan:   Post-operative Plan:   Informed Consent: I have reviewed the patients History and Physical, chart, labs and discussed the procedure including the risks, benefits and alternatives for the proposed anesthesia with the patient or authorized representative who has indicated his/her understanding and acceptance.     Dental advisory given  Plan Discussed with: CRNA  Anesthesia Plan Comments:         Anesthesia Quick Evaluation

## 2024-05-15 ENCOUNTER — Encounter (HOSPITAL_COMMUNITY): Payer: Self-pay | Admitting: Cardiology

## 2024-05-15 NOTE — Anesthesia Postprocedure Evaluation (Signed)
 Anesthesia Post Note  Patient: Jason Fleming  Procedure(s) Performed: CARDIOVERSION     Patient location during evaluation: PACU Anesthesia Type: General Level of consciousness: awake and alert Pain management: pain level controlled Vital Signs Assessment: post-procedure vital signs reviewed and stable Respiratory status: spontaneous breathing, nonlabored ventilation and respiratory function stable Cardiovascular status: blood pressure returned to baseline and stable Postop Assessment: no apparent nausea or vomiting Anesthetic complications: no   No notable events documented.  Last Vitals:  Vitals:   05/14/24 1310 05/14/24 1320  BP: (!) 127/92 (!) 129/94  Pulse: 73 71  Resp: 14 16  Temp:    SpO2: 100% 99%    Last Pain:  Vitals:   05/14/24 1306  TempSrc:   PainSc: 0-No pain                 [name]

## 2024-05-17 ENCOUNTER — Other Ambulatory Visit

## 2024-05-17 DIAGNOSIS — N289 Disorder of kidney and ureter, unspecified: Secondary | ICD-10-CM | POA: Diagnosis not present

## 2024-05-18 ENCOUNTER — Ambulatory Visit: Payer: Self-pay | Admitting: Family Medicine

## 2024-05-18 LAB — URINALYSIS, ROUTINE W REFLEX MICROSCOPIC
Bilirubin Urine: NEGATIVE
Hgb urine dipstick: NEGATIVE
Leukocytes,Ua: NEGATIVE
Nitrite: NEGATIVE
Protein, ur: NEGATIVE
Specific Gravity, Urine: 1.023 (ref 1.001–1.035)
pH: 5.5 (ref 5.0–8.0)

## 2024-05-18 LAB — BASIC METABOLIC PANEL WITH GFR
BUN: 14 mg/dL (ref 7–25)
CO2: 29 mmol/L (ref 20–32)
Calcium: 10.3 mg/dL (ref 8.6–10.3)
Chloride: 104 mmol/L (ref 98–110)
Creat: 1.19 mg/dL (ref 0.70–1.35)
Glucose, Bld: 82 mg/dL (ref 65–99)
Potassium: 4.6 mmol/L (ref 3.5–5.3)
Sodium: 142 mmol/L (ref 135–146)
eGFR: 69 mL/min/{1.73_m2} (ref >=60)

## 2024-05-20 ENCOUNTER — Other Ambulatory Visit (HOSPITAL_COMMUNITY): Payer: Self-pay

## 2024-05-20 ENCOUNTER — Ambulatory Visit
Admission: RE | Admit: 2024-05-20 | Discharge: 2024-05-20 | Disposition: A | Discharge status: Home / Self Care | Source: Ambulatory Visit | Attending: Family Medicine | Admitting: Family Medicine

## 2024-05-20 DIAGNOSIS — N281 Cyst of kidney, acquired: Secondary | ICD-10-CM | POA: Diagnosis not present

## 2024-05-20 DIAGNOSIS — N289 Disorder of kidney and ureter, unspecified: Secondary | ICD-10-CM

## 2024-05-21 ENCOUNTER — Ambulatory Visit (HOSPITAL_COMMUNITY): Payer: Self-pay | Admitting: Internal Medicine

## 2024-05-21 ENCOUNTER — Ambulatory Visit (HOSPITAL_COMMUNITY)
Admission: RE | Admit: 2024-05-21 | Discharge: 2024-05-21 | Disposition: A | Discharge status: Home / Self Care | Source: Ambulatory Visit | Attending: Internal Medicine

## 2024-05-21 DIAGNOSIS — I5022 Chronic systolic (congestive) heart failure: Secondary | ICD-10-CM | POA: Insufficient documentation

## 2024-05-21 LAB — BASIC METABOLIC PANEL WITH GFR
Anion gap: 5 (ref 5–15)
BUN: 10 mg/dL (ref 8–23)
CO2: 28 mmol/L (ref 22–32)
Calcium: 9.1 mg/dL (ref 8.9–10.3)
Chloride: 105 mmol/L (ref 98–111)
Creatinine, Ser: 1.04 mg/dL (ref 0.61–1.24)
GFR, Estimated: >60 mL/min (ref >60)
Glucose, Bld: 88 mg/dL (ref 70–99)
Potassium: 4.6 mmol/L (ref 3.5–5.1)
Sodium: 138 mmol/L (ref 135–145)

## 2024-05-23 ENCOUNTER — Ambulatory Visit: Payer: Self-pay | Admitting: Family Medicine

## 2024-05-27 ENCOUNTER — Other Ambulatory Visit

## 2024-05-27 DIAGNOSIS — N289 Disorder of kidney and ureter, unspecified: Secondary | ICD-10-CM

## 2024-05-28 ENCOUNTER — Ambulatory Visit: Payer: Self-pay | Admitting: Family Medicine

## 2024-05-28 LAB — BASIC METABOLIC PANEL WITH GFR
BUN: 24 mg/dL (ref 7–25)
CO2: 25 mmol/L (ref 20–32)
Calcium: 9.1 mg/dL (ref 8.6–10.3)
Chloride: 103 mmol/L (ref 98–110)
Creat: 1.15 mg/dL (ref 0.70–1.35)
Glucose, Bld: 105 mg/dL — ABNORMAL HIGH (ref 65–99)
Potassium: 4.5 mmol/L (ref 3.5–5.3)
Sodium: 139 mmol/L (ref 135–146)
eGFR: 72 mL/min/1.73m2 (ref >=60)

## 2024-05-31 ENCOUNTER — Telehealth (HOSPITAL_COMMUNITY): Payer: Self-pay | Admitting: *Deleted

## 2024-05-31 NOTE — Telephone Encounter (Signed)
 Called to confirm/remind patient of their appointment at the [facility] on 05/31/24.    Appointment:              [] Confirmed             [x] Left mess              [] No answer/No voice mail             [] Phone not in service   Patient reminded to bring all medications and/or complete list.   Confirmed patient has transportation. Gave directions, instructed to utilize valet parking.

## 2024-05-31 NOTE — Progress Notes (Signed)
 ADVANCED HF CLINIC NOTE   Primary Care: Duanne Butler DASEN, MD Primary Cardiologist: Annabella Scarce, MD HF Cardiologist: Dr. Rolan  HPI: ISSAAC Fleming is a 63 y.o.. male with hx HTN, atrial fibrillation, and newly diagnosed systolic heart failure.   Admitted 6/25 with AF with RVR with HR in 160s. He was started on diltiazem  gtt. This was weaned off and transitioned to metoprolol . Unfortunately he became hypotensive and required fluid bolus, + NE and milrinone  gtts. Ultimately progressed to CGS. Ltd echo showed EF ~30%, RV moderately reduced. Started on amio gtt, underwent TEE showing EF 25-30%, RV mildly down, moderate MR,  (per Dr. Orvilla read). Underwent R/LHC showing right dominant coronaries with minimal CAD in LAD, elevating filling pressures, preserved CI but low PAPi. Gtts weaned off. Underwent TEE/DCCV that was unsuccessful. He was re-loaded with amiodarone  and attempted repeat DCCV 05/01/24 with successful conversion to NSR. Amio weaned to oral, and GDMT started. cMRI showed LVEF 30% with worse function in the septum, RVEF 35%, mod MR. He was discharged home, weight 237 lbs.  Follow up with EP 6/25, arranged for AF ablation.   Found to be in AF with RVR, rates 130's at post hospital AHF follow up. Underwent DCCV to NSR 05/14/24.  Today he returns for HF follow up. Overall feeling fine. No SOB walking up steps, works full time on custom cars. Rare positional dizziness, no falls or syncope. Denies palpitations, abnormal bleeding, CP, edema, or PND/Orthopnea. Appetite ok. Weight at home 239 pounds. Taking all medications. Snores, awaiting sleep study. Rare ETOH, no cigarettes x 12 years, stopped vaping this year.  ECG (personally reviewed): NSR 81 bpm  Labs (6/25): LDL 26 Labs (7/25): K 4.5, creatinine 1.15  Cardiac Studies - cMRI (6/25): LVEF 30% (worse function in septum), RVEF 35%, moderate MR  - R/LHC (6/25): mild, non-obs CAD in LAD; RA 16, PA 42/23 (30), PCWP 23, CO/CI  (Fick) 7.8/3.21, PVR 0.9 WU, PAPi 1.25  - TEE (6/25): EF 25-30%, RV mildly reduced, moderate MR, no clot  - Echo (6/25): EF 25-30%, RV not well visualized, moderate MR  Past Medical History:  Diagnosis Date   Anxiety    Atrial fibrillation (HCC)    Depression    Hypertension    Paroxysmal atrial fibrillation (HCC)    cardioversion in ed 05/2018   Current Outpatient Medications  Medication Sig Dispense Refill   amiodarone  (PACERONE ) 200 MG tablet Take 2 tablets (400 mg total) by mouth 2 (two) times daily. 120 tablet 1   apixaban  (ELIQUIS ) 5 MG TABS tablet Take 1 tablet (5 mg total) by mouth 2 (two) times daily. 60 tablet 11   dapagliflozin  propanediol (FARXIGA ) 10 MG TABS tablet Take 1 tablet (10 mg total) by mouth daily. 30 tablet 5   digoxin  (LANOXIN ) 0.125 MG tablet Take 1 tablet (0.125 mg total) by mouth daily. 30 tablet 5   Multiple Vitamin (MULTIVITAMIN) tablet Take 1 tablet by mouth daily.     sacubitril -valsartan  (ENTRESTO ) 24-26 MG Take 1 tablet by mouth 2 (two) times daily. 60 tablet 5   spironolactone  (ALDACTONE ) 25 MG tablet Take 0.5 tablets (12.5 mg total) by mouth daily.     torsemide  (DEMADEX ) 20 MG tablet Take 1 tablet (20 mg total) by mouth daily as needed. 30 tablet 3   venlafaxine  XR (EFFEXOR -XR) 150 MG 24 hr capsule TAKE 1 CAPSULE BY MOUTH DAILY  WITH BREAKFAST (NEED APPT WITH  PCP) 30 capsule 11   Omega-3 Fatty Acids (FISH OIL PO)  Take 1 capsule by mouth daily. (Patient not taking: Reported on 06/01/2024)     No current facility-administered medications for this encounter.   No Known Allergies  Social History   Socioeconomic History   Marital status: Married    Spouse name: Not on file   Number of children: Not on file   Years of education: Not on file   Highest education level: 12th grade  Occupational History   Not on file  Tobacco Use   Smoking status: Former    Current packs/day: 0.00    Types: E-cigarettes, Cigarettes    Quit date: 03/02/2012     Years since quitting: 12.2   Smokeless tobacco: Never  Vaping Use   Vaping status: Former  Substance and Sexual Activity   Alcohol use: Yes    Alcohol/week: 3.0 standard drinks of alcohol    Types: 3 Cans of beer per week   Drug use: No   Sexual activity: Not Currently    Birth control/protection: None  Other Topics Concern   Not on file  Social History Narrative   Not on file   Social Drivers of Health   Financial Resource Strain: Low Risk  (04/25/2024)   Overall Financial Resource Strain (CARDIA)    Difficulty of Paying Living Expenses: Not hard at all  Food Insecurity: No Food Insecurity (04/26/2024)   Hunger Vital Sign    Worried About Running Out of Food in the Last Year: Never true    Ran Out of Food in the Last Year: Never true  Transportation Needs: No Transportation Needs (04/26/2024)   PRAPARE - Administrator, Civil Service (Medical): No    Lack of Transportation (Non-Medical): No  Physical Activity: Inactive (04/25/2024)   Exercise Vital Sign    Days of Exercise per Week: 0 days    Minutes of Exercise per Session: 0 min  Stress: Stress Concern Present (04/25/2024)   [employer] of Occupational Health - Occupational Stress Questionnaire    Feeling of Stress : Rather much  Social Connections: Moderately Integrated (04/25/2024)   Social Connection and Isolation Panel    Frequency of Communication with Friends and Family: More than three times a week    Frequency of Social Gatherings with Friends and Family: Twice a week    Attends Religious Services: More than 4 times per year    Active Member of [employer] or Organizations: No    Attends Banker Meetings: Never    Marital Status: Married  Catering manager Violence: Not At Risk (04/26/2024)   Humiliation, Afraid, Rape, and Kick questionnaire    Fear of Current or Ex-Partner: No    Emotionally Abused: No    Physically Abused: No    Sexually Abused: No   Family History  Problem Relation Age of  Onset   Anxiety disorder Mother    Hypertension Mother    Depression Father    Heart disease Father    CAD Father        CABG x 3 in 2000   Stroke Maternal Grandfather    Alcohol abuse Maternal Uncle    Alcohol abuse Paternal Uncle    Colon cancer Neg Hx    Colon polyps Neg Hx    Esophageal cancer Neg Hx    Stomach cancer Neg Hx    Rectal cancer Neg Hx     Wt Readings from Last 3 Encounters:  06/01/24 112.8 kg (248 lb 9.6 oz)  05/13/24 109.8 kg (242 lb)  05/10/24  109.8 kg (242 lb)   BP (!) 144/84   Pulse 83   Ht 6' 2 (1.88 m)   Wt 112.8 kg (248 lb 9.6 oz)   SpO2 98%   BMI 31.92 kg/m   PHYSICAL EXAM: General:  NAD. No resp difficulty, walked into clinic HEENT: Normal Neck: Supple. No JVD. Cor: Regular rate & rhythm. No rubs, gallops or murmurs. Lungs: Clear Abdomen: Soft, nontender, nondistended.  Extremities: No cyanosis, clubbing, rash, edema Neuro: Alert & oriented x 3, moves all 4 extremities w/o difficulty. Affect pleasant.  ASSESSMENT & PLAN: 1. Atrial fibrillation: 1 prior episode of AF with DCCV.  Admitted 6/25 with AF RVR of uncertain duration. Later developed shock after administration of Dilt/metoprolol . S/p unsuccessful TEE/DCCV 04/29/24, successful DCCV on 05/01/24. Had ERAF with RVR, repeat DCCV 05/14/24 to NSR. He remains in NSR on ECG today. - Decrease amiodarone  to 200 mg bid. Follows LFTs and TSH, will need regular eye exam while on amiodarone .  - Continue apixaban  5 mg bid. Denies abnormal bleeding.  CBC today. - Seen EP Dr. cindie, planning for ablation 06/29/24. Will get pre-procedure labs today for EP. 2. Chronic systolic CHF: Recent admission 6/25 with CGS. Echo (difficult/incomplete study): EF~30%, moderate RV dysfunction, moderate MR. Cause of cardiomyopathy uncertain. Elevated calcium  score in past in LAD territory, so has some degree of CAD however LHC showed mild non-obs CAD.  ?long-standing unrecognized AF/RVR with tachycardia-mediated CMP. TEE  04/29/24: EF 25-30%, RV mildly down, moderate MR. R/LHC with right dominant coronaries with minimal CAD in LAD, elevated filling pressures, preserved CI but low PAPi. cMRI (6/25): LVEF 30% with worse function in the septum, RVEF 35%, mod MR. Today, NYHA I, he is not volume overloaded by exam.  - Start Toprol  XL 25 mg daily (CI 2.8 on cath). - Continue Farxiga  10 mg daily. - Continue torsemide  20 mg PRN - Continue digoxin  0.125 daily. Dig level 04/30/24 <0.4 - Continue spironolactone  12.5 mg daily - Continue Entresto  24/26 mg bid - Repeat echo post-ablation. 3. Elevated LFTs: Suspected shock liver. LFTs now normalized.     - Restart Crestor  20 mg daily. LFTs and lipids in 6 weeks. 4. CAD: mild, non-obstructive disease in LAD by LHC. No chest pain. - No ASA with Eliquis . - Restart statin as above. 5. Snoring: PCP has referred for sleep study.  Follow up in 2-3 months with Dr. Rolan + echo.   Harlene HERO [facility] FNP-BC 06/01/24

## 2024-06-01 ENCOUNTER — Encounter (HOSPITAL_COMMUNITY): Payer: Self-pay

## 2024-06-01 ENCOUNTER — Ambulatory Visit (HOSPITAL_COMMUNITY)
Admission: RE | Admit: 2024-06-01 | Discharge: 2024-06-01 | Disposition: A | Discharge status: Home / Self Care | Source: Ambulatory Visit | Attending: Family Medicine | Admitting: Family Medicine

## 2024-06-01 ENCOUNTER — Ambulatory Visit (HOSPITAL_COMMUNITY): Payer: Self-pay | Admitting: Family Medicine

## 2024-06-01 VITALS — BP 144/84 | HR 83 | Ht 74.0 in | Wt 248.6 lb

## 2024-06-01 DIAGNOSIS — Z7901 Long term (current) use of anticoagulants: Secondary | ICD-10-CM | POA: Insufficient documentation

## 2024-06-01 DIAGNOSIS — I5022 Chronic systolic (congestive) heart failure: Secondary | ICD-10-CM | POA: Diagnosis not present

## 2024-06-01 DIAGNOSIS — R0602 Shortness of breath: Secondary | ICD-10-CM | POA: Diagnosis not present

## 2024-06-01 DIAGNOSIS — Z87891 Personal history of nicotine dependence: Secondary | ICD-10-CM | POA: Diagnosis not present

## 2024-06-01 DIAGNOSIS — I110 Hypertensive heart disease with heart failure: Secondary | ICD-10-CM | POA: Insufficient documentation

## 2024-06-01 DIAGNOSIS — Z79899 Other long term (current) drug therapy: Secondary | ICD-10-CM | POA: Diagnosis not present

## 2024-06-01 DIAGNOSIS — I480 Paroxysmal atrial fibrillation: Secondary | ICD-10-CM | POA: Insufficient documentation

## 2024-06-01 DIAGNOSIS — I2510 Atherosclerotic heart disease of native coronary artery without angina pectoris: Secondary | ICD-10-CM | POA: Insufficient documentation

## 2024-06-01 DIAGNOSIS — R7989 Other specified abnormal findings of blood chemistry: Secondary | ICD-10-CM | POA: Diagnosis not present

## 2024-06-01 DIAGNOSIS — Z7984 Long term (current) use of oral hypoglycemic drugs: Secondary | ICD-10-CM | POA: Diagnosis not present

## 2024-06-01 DIAGNOSIS — R0683 Snoring: Secondary | ICD-10-CM

## 2024-06-01 LAB — CBC
HCT: 42.2 % (ref 39.0–52.0)
Hemoglobin: 13.4 g/dL (ref 13.0–17.0)
MCH: 27.6 pg (ref 26.0–34.0)
MCHC: 31.8 g/dL (ref 30.0–36.0)
MCV: 87 fL (ref 80.0–100.0)
Platelets: 205 K/uL (ref 150–400)
RBC: 4.85 MIL/uL (ref 4.22–5.81)
RDW: 15.3 % (ref 11.5–15.5)
WBC: 6.6 K/uL (ref 4.0–10.5)
nRBC: 0 % (ref 0.0–0.2)

## 2024-06-01 LAB — BASIC METABOLIC PANEL WITH GFR
Anion gap: 13 (ref 5–15)
BUN: 16 mg/dL (ref 8–23)
CO2: 26 mmol/L (ref 22–32)
Calcium: 9.6 mg/dL (ref 8.9–10.3)
Chloride: 101 mmol/L (ref 98–111)
Creatinine, Ser: 1.29 mg/dL — ABNORMAL HIGH (ref 0.61–1.24)
GFR, Estimated: >60 mL/min (ref >60)
Glucose, Bld: 89 mg/dL (ref 70–99)
Potassium: 4.7 mmol/L (ref 3.5–5.1)
Sodium: 140 mmol/L (ref 135–145)

## 2024-06-01 LAB — BRAIN NATRIURETIC PEPTIDE: B Natriuretic Peptide: 10.9 pg/mL (ref 0.0–100.0)

## 2024-06-01 MED ORDER — AMIODARONE HCL 200 MG PO TABS: 200.0000 mg | ORAL_TABLET | Freq: Two times a day (BID) | ORAL | 3 refills | Status: DC

## 2024-06-01 MED ORDER — ROSUVASTATIN CALCIUM 20 MG PO TABS: 20.0000 mg | ORAL_TABLET | Freq: Every day | ORAL | 3 refills | Status: AC

## 2024-06-01 MED ORDER — APIXABAN 5 MG PO TABS: 5.0000 mg | ORAL_TABLET | Freq: Two times a day (BID) | ORAL | 3 refills | Status: AC

## 2024-06-01 MED ORDER — METOPROLOL SUCCINATE ER 25 MG PO TB24: 25.0000 mg | ORAL_TABLET | Freq: Every day | ORAL | 3 refills | Status: AC

## 2024-06-01 MED ORDER — DAPAGLIFLOZIN PROPANEDIOL 10 MG PO TABS: 10.0000 mg | ORAL_TABLET | Freq: Every day | ORAL | 3 refills | Status: AC

## 2024-06-01 MED ORDER — SACUBITRIL-VALSARTAN 24-26 MG PO TABS: 1.0000 | ORAL_TABLET | Freq: Two times a day (BID) | ORAL | 3 refills | Status: AC

## 2024-06-01 MED ORDER — TORSEMIDE 20 MG PO TABS: 20.0000 mg | ORAL_TABLET | Freq: Every day | ORAL | 3 refills | Status: DC | PRN

## 2024-06-01 MED ORDER — SPIRONOLACTONE 25 MG PO TABS
12.5000 mg | ORAL_TABLET | Freq: Every day | ORAL | 3 refills | Status: DC
Start: 2024-06-01 — End: 2024-07-28

## 2024-06-01 MED ORDER — DIGOXIN 125 MCG PO TABS: 0.1250 mg | ORAL_TABLET | Freq: Every day | ORAL | 3 refills | Status: DC

## 2024-06-01 NOTE — Patient Instructions (Signed)
 Decrease amiodarone  to 200 mg twice daily. Re-start Crestor  20 mg daily. Start Toprol  XL 25 mg every day at bedtime. Labs today - will call you if abnormal. Repeat labs in 6 weeks - see below. Return to see Dr. Rolan with echo in 3 months. PLEASE CALL [phone] IN SEPTEMBER TO SCHEDULE THIS APPOINTMENT. Please call us  at [phone] if any questions or concerns prior to your next visit.

## 2024-06-07 ENCOUNTER — Ambulatory Visit (HOSPITAL_BASED_OUTPATIENT_CLINIC_OR_DEPARTMENT_OTHER): Admitting: Nurse Practitioner

## 2024-06-08 ENCOUNTER — Ambulatory Visit
Admission: RE | Admit: 2024-06-08 | Discharge: 2024-06-08 | Disposition: A | Discharge status: Home / Self Care | Source: Ambulatory Visit | Attending: Cardiology | Admitting: Cardiology

## 2024-06-08 DIAGNOSIS — I4819 Other persistent atrial fibrillation: Secondary | ICD-10-CM | POA: Insufficient documentation

## 2024-06-08 DIAGNOSIS — Z79899 Other long term (current) drug therapy: Secondary | ICD-10-CM | POA: Insufficient documentation

## 2024-06-08 DIAGNOSIS — I5022 Chronic systolic (congestive) heart failure: Secondary | ICD-10-CM | POA: Insufficient documentation

## 2024-06-08 MED ORDER — SODIUM CHLORIDE 0.9 % IV SOLN: INTRAVENOUS | Status: DC

## 2024-06-08 MED ORDER — IOHEXOL 350 MG/ML SOLN
100.0000 mL | Freq: Once | INTRAVENOUS | Status: AC | PRN
  Administered 2024-06-08: 100 mL via INTRAVENOUS

## 2024-06-21 ENCOUNTER — Telehealth (HOSPITAL_COMMUNITY): Payer: Self-pay

## 2024-06-21 NOTE — Telephone Encounter (Signed)
 Attempted to reach patient to discuss upcoming procedure, no answer. Left VM for patient to return call.

## 2024-06-21 NOTE — Telephone Encounter (Addendum)
 Patient returned call to discuss upcoming procedure.   CT: completed.  Labs: completed.   Any recent signs of acute illness or been started on antibiotics? No Any new medications started? No Any medications to hold?  Hold Farxiga  for 3 days prior to the procedure- last dose on August 01. Any missed doses of blood thinner? No Advised patient to continue taking ANTICOAGULANT: Eliquis  (Apixaban ) twice daily without missing any doses.  Medication instructions:  On the morning of your procedure DO NOT take any medication., including Eliquis  or the procedure may be rescheduled. Nothing to eat or drink after midnight prior to your procedure.  Confirmed patient is scheduled for Atrial Fibrillation Ablation on Tuesday, August 5 with Dr. Ole Holts. Instructed patient to arrive at the Main Entrance A at [facility]: [address] [place], [place] [address] and check in at Admitting at 8:00 AM.  Advised of plan to go home the same day and will only stay overnight if medically necessary. You MUST have a responsible adult to drive you home and MUST be with you the first 24 hours after you arrive home or your procedure could be cancelled.  Patient verbalized understanding to all instructions provided and agreed to proceed with procedure.

## 2024-06-28 NOTE — Pre-Procedure Instructions (Signed)
 Instructed patient on the following items: Arrival time 0800 Nothing to eat or drink after midnight No meds AM of procedure Responsible person to drive you home and stay with you for 24 hrs  Have you missed any doses of anti-coagulant Eliquis - takes twice a day, hasn't missed any doses.  Don't take morning of procedure.

## 2024-06-29 ENCOUNTER — Encounter (HOSPITAL_COMMUNITY): Payer: Self-pay | Admitting: Cardiology

## 2024-06-29 ENCOUNTER — Other Ambulatory Visit (HOSPITAL_COMMUNITY): Payer: Self-pay

## 2024-06-29 ENCOUNTER — Ambulatory Visit (HOSPITAL_COMMUNITY): Admitting: Registered Nurse

## 2024-06-29 ENCOUNTER — Ambulatory Visit (HOSPITAL_COMMUNITY)
Admission: RE | Disposition: A | Discharge status: Home / Self Care | Payer: Self-pay | Source: Home / Self Care | Attending: Cardiology

## 2024-06-29 ENCOUNTER — Other Ambulatory Visit: Payer: Self-pay

## 2024-06-29 ENCOUNTER — Ambulatory Visit (HOSPITAL_COMMUNITY)
Admission: RE | Admit: 2024-06-29 | Discharge: 2024-06-29 | Disposition: A | Discharge status: Home / Self Care | Attending: Cardiology | Admitting: Cardiology

## 2024-06-29 DIAGNOSIS — I110 Hypertensive heart disease with heart failure: Secondary | ICD-10-CM | POA: Insufficient documentation

## 2024-06-29 DIAGNOSIS — I5022 Chronic systolic (congestive) heart failure: Secondary | ICD-10-CM | POA: Diagnosis not present

## 2024-06-29 DIAGNOSIS — I4819 Other persistent atrial fibrillation: Secondary | ICD-10-CM

## 2024-06-29 DIAGNOSIS — I10 Essential (primary) hypertension: Secondary | ICD-10-CM | POA: Diagnosis not present

## 2024-06-29 DIAGNOSIS — Z87891 Personal history of nicotine dependence: Secondary | ICD-10-CM | POA: Diagnosis not present

## 2024-06-29 DIAGNOSIS — Z79899 Other long term (current) drug therapy: Secondary | ICD-10-CM | POA: Insufficient documentation

## 2024-06-29 DIAGNOSIS — I4891 Unspecified atrial fibrillation: Secondary | ICD-10-CM | POA: Diagnosis not present

## 2024-06-29 HISTORY — PX: ATRIAL FIBRILLATION ABLATION: EP1191

## 2024-06-29 LAB — POCT ACTIVATED CLOTTING TIME: Activated Clotting Time: 337 s

## 2024-06-29 MED ORDER — FENTANYL CITRATE (PF) 250 MCG/5ML IJ SOLN
INTRAMUSCULAR | Status: DC | PRN
  Administered 2024-06-29 (×2): 50 ug via INTRAVENOUS

## 2024-06-29 MED ORDER — COLCHICINE 0.6 MG PO TABS
0.6000 mg | ORAL_TABLET | Freq: Two times a day (BID) | ORAL | Status: DC
  Administered 2024-06-29: 0.6 mg via ORAL
  Filled 2024-06-29 (×2): qty 1

## 2024-06-29 MED ORDER — COLCHICINE 0.6 MG PO TABS
0.6000 mg | ORAL_TABLET | Freq: Two times a day (BID) | ORAL | 0 refills | Status: DC
  Filled 2024-06-29: qty 10, 5d supply, fill #0

## 2024-06-29 MED ORDER — ROCURONIUM BROMIDE 10 MG/ML (PF) SYRINGE
PREFILLED_SYRINGE | INTRAVENOUS | Status: DC | PRN
  Administered 2024-06-29: 80 mg via INTRAVENOUS
  Administered 2024-06-29: 10 mg via INTRAVENOUS

## 2024-06-29 MED ORDER — EPHEDRINE SULFATE-NACL 50-0.9 MG/10ML-% IV SOSY
PREFILLED_SYRINGE | INTRAVENOUS | Status: DC | PRN
Start: 2024-06-29 — End: 2024-06-29
  Administered 2024-06-29 (×2): 5 mg via INTRAVENOUS

## 2024-06-29 MED ORDER — SODIUM CHLORIDE 0.9 % IV SOLN: 250.0000 mL | INTRAVENOUS | Status: DC | PRN

## 2024-06-29 MED ORDER — FENTANYL CITRATE (PF) 100 MCG/2ML IJ SOLN
INTRAMUSCULAR | Status: AC
Start: 2024-06-29 — End: 2024-06-29
  Filled 2024-06-29: qty 2

## 2024-06-29 MED ORDER — PHENYLEPHRINE HCL-NACL 20-0.9 MG/250ML-% IV SOLN
INTRAVENOUS | Status: DC | PRN
  Administered 2024-06-29: 10 ug/min via INTRAVENOUS

## 2024-06-29 MED ORDER — PANTOPRAZOLE SODIUM 40 MG PO TBEC
40.0000 mg | DELAYED_RELEASE_TABLET | Freq: Every day | ORAL | Status: DC
  Administered 2024-06-29: 40 mg via ORAL
  Filled 2024-06-29: qty 1

## 2024-06-29 MED ORDER — SUGAMMADEX SODIUM 200 MG/2ML IV SOLN: INTRAVENOUS | Status: DC | PRN

## 2024-06-29 MED ORDER — APIXABAN 5 MG PO TABS
5.0000 mg | ORAL_TABLET | Freq: Two times a day (BID) | ORAL | Status: DC
  Administered 2024-06-29: 5 mg via ORAL
  Filled 2024-06-29: qty 1

## 2024-06-29 MED ORDER — PANTOPRAZOLE SODIUM 40 MG PO TBEC
40.0000 mg | DELAYED_RELEASE_TABLET | Freq: Every day | ORAL | 0 refills | Status: DC
  Filled 2024-06-29: qty 45, 45d supply, fill #0

## 2024-06-29 MED ORDER — PROPOFOL 10 MG/ML IV BOLUS
INTRAVENOUS | Status: DC | PRN
  Administered 2024-06-29: 100 mg via INTRAVENOUS

## 2024-06-29 MED ORDER — ATROPINE SULFATE 1 MG/ML IV SOLN
INTRAVENOUS | Status: DC | PRN
  Administered 2024-06-29: 1 mg via INTRAVENOUS

## 2024-06-29 MED ORDER — SUGAMMADEX SODIUM 200 MG/2ML IV SOLN
INTRAVENOUS | Status: DC | PRN
  Administered 2024-06-29: 450 mg via INTRAVENOUS

## 2024-06-29 MED ORDER — SODIUM CHLORIDE 0.9% FLUSH
3.0000 mL | INTRAVENOUS | Status: DC | PRN
Start: 2024-06-29 — End: 2024-06-29

## 2024-06-29 MED ORDER — HEPARIN (PORCINE) IN NACL 1000-0.9 UT/500ML-% IV SOLN
INTRAVENOUS | Status: DC | PRN
  Administered 2024-06-29 (×2): 500 mL

## 2024-06-29 MED ORDER — SODIUM CHLORIDE 0.9 % IV SOLN: INTRAVENOUS | Status: DC

## 2024-06-29 MED ORDER — PROTAMINE SULFATE 10 MG/ML IV SOLN
INTRAVENOUS | Status: DC | PRN
  Administered 2024-06-29: 10 mg via INTRAVENOUS
  Administered 2024-06-29: 25 mg via INTRAVENOUS

## 2024-06-29 MED ORDER — LIDOCAINE 2% (20 MG/ML) 5 ML SYRINGE
INTRAMUSCULAR | Status: DC | PRN
  Administered 2024-06-29: 100 mg via INTRAVENOUS

## 2024-06-29 MED ORDER — DEXAMETHASONE SODIUM PHOSPHATE 10 MG/ML IJ SOLN
INTRAMUSCULAR | Status: DC | PRN
  Administered 2024-06-29: 10 mg via INTRAVENOUS

## 2024-06-29 MED ORDER — ONDANSETRON HCL 4 MG/2ML IJ SOLN: 4.0000 mg | Freq: Four times a day (QID) | INTRAMUSCULAR | Status: DC | PRN

## 2024-06-29 MED ORDER — ONDANSETRON HCL 4 MG/2ML IJ SOLN
INTRAMUSCULAR | Status: DC | PRN
  Administered 2024-06-29: 4 mg via INTRAVENOUS

## 2024-06-29 MED ORDER — PHENYLEPHRINE 80 MCG/ML (10ML) SYRINGE FOR IV PUSH (FOR BLOOD PRESSURE SUPPORT)
PREFILLED_SYRINGE | INTRAVENOUS | Status: DC | PRN
  Administered 2024-06-29 (×2): 80 ug via INTRAVENOUS

## 2024-06-29 MED ORDER — SODIUM CHLORIDE 0.9% FLUSH: 3.0000 mL | Freq: Two times a day (BID) | INTRAVENOUS | Status: DC

## 2024-06-29 MED ORDER — HEPARIN SODIUM (PORCINE) 1000 UNIT/ML IJ SOLN
INTRAMUSCULAR | Status: DC | PRN
  Administered 2024-06-29: 17000 [IU] via INTRAVENOUS

## 2024-06-29 MED ORDER — ACETAMINOPHEN 325 MG PO TABS: 650.0000 mg | ORAL_TABLET | ORAL | Status: DC | PRN

## 2024-06-29 SURGICAL SUPPLY — 18 items
BAG SNAP BAND KOVER 36X36 (MISCELLANEOUS) IMPLANT
BLANKET WARM UNDERBOD FULL ACC (MISCELLANEOUS) ×1 IMPLANT
CABLE FARASTAR GEN2 SNGL USE (CABLE) IMPLANT
CATH ACUNAV GE 8F-90 (CATHETERS) IMPLANT
CATH FARAWAVE NAV 31 (CATHETERS) IMPLANT
CATH POLARIS X 2.5/5/2.5 DECAP (CATHETERS) IMPLANT
CLOSURE PERCLOSE PROSTYLE (VASCULAR PRODUCTS) IMPLANT
COVER SWIFTLINK CONNECTOR (BAG) ×1 IMPLANT
DILATOR VESSEL 38 20CM 16FR (INTRODUCER) IMPLANT
GUIDEWIRE INQWIRE 1.5J.035X260 (WIRE) IMPLANT
KIT PATCH RHYTHMIA HDX (MISCELLANEOUS) IMPLANT
KIT VERSACROSS CNCT FARADRIVE (KITS) IMPLANT
PACK EP LF (CUSTOM PROCEDURE TRAY) ×1 IMPLANT
PAD DEFIB RADIO PHYSIO CONN (PAD) ×1 IMPLANT
SHEATH FARADRIVE STEERABLE (SHEATH) IMPLANT
SHEATH PINNACLE 8F 10CM (SHEATH) IMPLANT
SHEATH PINNACLE 9F 10CM (SHEATH) IMPLANT
SHEATH PROBE COVER 6X72 (BAG) IMPLANT

## 2024-06-29 NOTE — H&P (Signed)
 Electrophysiology Office Note:     Date:  06/29/2024    ID:  DAWOOD SPITLER, DOB 1961-05-16, MRN [id number]   [facility] Cardiologist:  Annabella Scarce, MD  [facility] Electrophysiologist:  OLE ONEIDA HOLTS, MD    Referring MD: Duanne Butler ONEIDA, MD    Chief Complaint: AF   History of Present Illness:     Mr Petro is a 63yo man who I am seeing today for AF at the request of Dr Duanne.    He was recently hospitalized 6/2 - 6/9 for AF and HF. He has a history of HTN and AF. He was admitted and found to be in AF w/ RVR with a reduced EF (25%). He was started on amiodarone  and underwent TEE guided DCCV on 6/5/20225. The DCCV was not successful and he was DCCV again 6/7. That time, it was successful. He presents today to discuss more durable rhythm control--specifically catheter ablation.   He is doing well today.  He tells me that now that he is in better health and his heart rhythm has returned to normal, he is able to realize how poorly he was doing prior to the hospitalization.  He apparently was very short of breath with decreased exercise tolerance and now much of that has resolved.  He is feeling much more optimistic and improved since leaving the hospital.  He is interested in ablation long-term in an effort to avoid long-term exposure to amiodarone .  He is talked to several people around him who have had catheter ablations.  Presents for AF ablation. Procedure reviewed.     Objective Their past medical, social and family history was reviewed.     ROS:   Please see the history of present illness.    All other systems reviewed and are negative.   EKGs/Labs/Other Studies Reviewed:     The following studies were reviewed today:   05/03/2024 cMR EF 30 Moderate MR No LGE   04/27/2024 Echo EF 25-30 RV moderately reduced Moderate MR   EKG Interpretation Date/Time:                  Wednesday May 05 2024 10:47:38 EDT Ventricular Rate:         73 PR Interval:                  164 QRS Duration:             92 QT Interval:                 408 QTC Calculation:449 R Axis:                         -56   Text Interpretation:Normal sinus rhythm Left axis deviation Confirmed by HOLTS OLE [phone]) on 05/05/2024 10:49:09 AM      Physical Exam:     VS:  BP 90/68 (BP Location: Left Arm)   Pulse 73   Ht 6' 1 (1.854 m)   Wt 240 lb (108.9 kg)   SpO2 97%   BMI 31.66 kg/m         Wt Readings from Last 3 Encounters:  05/05/24 240 lb (108.9 kg)  05/03/24 238 lb 3.2 oz (108 kg)  04/26/24 251 lb 9.6 oz (114.1 kg)      GEN: no distress.  Overweight CARD: RRR, No MRG RESP: No IWOB. CTAB.         Assessment ASSESSMENT AND PLAN:  1. Persistent atrial fibrillation (HCC)   2. Chronic systolic heart failure (HCC)   3. Encounter for long-term (current) use of high-risk medication       #AF #High risk med monitoring - amiodarone  Rhythm control indicated given HFrEF. I discussed treatment options including conservative therapy, AAD and ablation. Given his young age, AAD is not an ideal long term strategy. I discussed ablation in detail including the risks and likelihood of success. He would like to proceed.   Discussed treatment options today for AF including antiarrhythmic drug therapy and ablation. Discussed risks, recovery and likelihood of success with each treatment strategy. Risk, benefits, and alternatives to EP study and ablation for afib were discussed. These risks include but are not limited to stroke, bleeding, vascular damage, tamponade, perforation, damage to the esophagus, lungs, phrenic nerve and other structures, pulmonary vein stenosis, worsening renal function, coronary vasospasm and death.  Discussed potential need for repeat ablation procedures and antiarrhythmic drugs after an initial ablation. The patient understands these risk and wishes to proceed.  We will therefore proceed with catheter ablation at the next available time.  Carto,  ICE, anesthesia are requested for the procedure.  Will also obtain CT PV protocol prior to the procedure to exclude LAA thrombus and further evaluate atrial anatomy.   LFTs trending down. Likely were elevated in setting of low output.   #Chronic systolic heart failure NYHA II-III. EF 35% Rhythm control indicated as above Cont spironolactone , entresto , digoxin , farxiga       Presents for AF ablation. Procedure reviewed.     Signed, Ole DASEN. Cindie, MD, [facility], [facility] 06/29/2024 Electrophysiology [facility]

## 2024-06-29 NOTE — Anesthesia Preprocedure Evaluation (Addendum)
 Anesthesia Evaluation  Patient identified by MRN, date of birth, ID band Patient awake    Reviewed: Allergy & Precautions, NPO status , Patient's Chart, lab work & pertinent test results, reviewed documented beta blocker date and time   History of Anesthesia Complications Negative for: history of anesthetic complications  Airway Mallampati: III  TM Distance: >3 FB Neck ROM: Full    Dental no notable dental hx.    Pulmonary neg COPD, former smoker, neg PE   breath sounds clear to auscultation       Cardiovascular hypertension, +CHF  + dysrhythmias Atrial Fibrillation (-) pacemaker(-) Cardiac Defibrillator + Valvular Problems/Murmurs MR  Rhythm:Regular Rate:Normal  IMPRESSIONS     1. Left ventricular ejection fraction, by estimation, is 25 to 30%. The  left ventricle has severely decreased function. The left ventricle  demonstrates global hypokinesis.   2. Right ventricular systolic function is mildly reduced. The right  ventricular size is normal.   3. Peak RV-RA gradient 15 mmHg.   4. Left atrial size was mildly dilated. No left atrial/left atrial  appendage thrombus was detected.   5. Right atrial size was mildly dilated.   6. No PFO/ASD by color doppler.   7. The aortic valve is tricuspid. Aortic valve regurgitation is trivial.  No aortic stenosis is present.   8. There were two jets of mitral regurgitation, overall mitral  regurgitation was likely moderate. Suspect functional related to atrial  fibrillation. PISA ERO of the combined jets only calculated to 0.17 cm^2.  No evidence of mitral stenosis.      Neuro/Psych neg Seizures PSYCHIATRIC DISORDERS Anxiety Depression       GI/Hepatic   Endo/Other    Renal/GU CRFRenal disease     Musculoskeletal   Abdominal   Peds  Hematology   Anesthesia Other Findings   Reproductive/Obstetrics                              Anesthesia  Physical Anesthesia Plan  ASA: 4  Anesthesia Plan: General   Post-op Pain Management:    Induction: Intravenous  PONV Risk Score and Plan: 2 and Ondansetron  and Dexamethasone   Airway Management Planned: Oral ETT  Additional Equipment:   Intra-op Plan:   Post-operative Plan: Extubation in OR  Informed Consent: I have reviewed the patients History and Physical, chart, labs and discussed the procedure including the risks, benefits and alternatives for the proposed anesthesia with the patient or authorized representative who has indicated his/her understanding and acceptance.     Dental advisory given  Plan Discussed with: CRNA  Anesthesia Plan Comments:          Anesthesia Quick Evaluation

## 2024-06-29 NOTE — Discharge Instructions (Signed)

## 2024-06-29 NOTE — Anesthesia Postprocedure Evaluation (Signed)
 Anesthesia Post Note  Patient: Jason Fleming  Procedure(s) Performed: ATRIAL FIBRILLATION ABLATION     Patient location during evaluation: PACU Anesthesia Type: General Level of consciousness: awake and alert Pain management: pain level controlled Vital Signs Assessment: post-procedure vital signs reviewed and stable Respiratory status: spontaneous breathing, nonlabored ventilation, respiratory function stable and patient connected to nasal cannula oxygen Cardiovascular status: blood pressure returned to baseline and stable Postop Assessment: no apparent nausea or vomiting Anesthetic complications: no   There were no known notable events for this encounter.  Last Vitals:  Vitals:   06/29/24 1130 06/29/24 1200  BP: 109/69 111/70  Pulse: (!) 50 (!) 54  Resp: (!) 9 13  Temp:    SpO2: 95% 92%    Last Pain:  Vitals:   06/29/24 1040  TempSrc:   PainSc: 0-No pain   Pain Goal:                   Franky JONETTA Bald

## 2024-06-29 NOTE — Anesthesia Procedure Notes (Signed)
 Procedure Name: Intubation Date/Time: 06/29/2024 8:47 AM  Performed by: Virgil Ee, CRNAPre-anesthesia Checklist: Patient identified, Patient being monitored, Timeout performed, Emergency Drugs available and Suction available Patient Re-evaluated:Patient Re-evaluated prior to induction Oxygen Delivery Method: Circle system utilized Preoxygenation: Pre-oxygenation with 100% oxygen Induction Type: IV induction Ventilation: Mask ventilation without difficulty Laryngoscope Size: Mac, 3 and 4 Grade View: Grade II Tube type: Oral Tube size: 7.5 mm Number of attempts: 1 Airway Equipment and Method: Stylet Placement Confirmation: ETT inserted through vocal cords under direct vision, positive ETCO2 and breath sounds checked- equal and bilateral Secured at: 23 cm Tube secured with: Tape Dental Injury: Teeth and Oropharynx as per pre-operative assessment

## 2024-06-29 NOTE — Transfer of Care (Signed)
 Immediate Anesthesia Transfer of Care Note  Patient: Jason Fleming  Procedure(s) Performed: ATRIAL FIBRILLATION ABLATION  Patient Location: Cath Lab  Anesthesia Type:General  Level of Consciousness: awake  Airway & Oxygen Therapy: Patient Spontanous Breathing  Post-op Assessment: Report given to RN and Post -op Vital signs reviewed and stable  Post vital signs: Reviewed and stable  Last Vitals:  Vitals Value Taken Time  BP 118/78 06/29/24 10:15  Temp 37 C 06/29/24 10:00  Pulse 58 06/29/24 10:18  Resp 11 06/29/24 10:18  SpO2 95 % 06/29/24 10:18  Vitals shown include unfiled device data.  Last Pain:  Vitals:   06/29/24 1000  TempSrc: Oral  PainSc: 0-No pain         Complications: There were no known notable events for this encounter.

## 2024-06-30 ENCOUNTER — Telehealth (HOSPITAL_COMMUNITY): Payer: Self-pay

## 2024-06-30 ENCOUNTER — Encounter: Payer: Self-pay | Admitting: Emergency Medicine

## 2024-06-30 NOTE — Telephone Encounter (Signed)
 Spoke with patient to complete post procedure follow up call.  Patient reports no complications with groin sites.   Instructions reviewed with patient:  Remove large bandage at puncture site after 24 hours. It is normal to have bruising, tenderness, mild swelling, and a pea or marble sized lump/knot at the groin site which can take up to three months to resolve.  Get help right away if you notice sudden swelling at the puncture site.  Check your puncture site every day for signs of infection: fever, redness, swelling, pus drainage, warmth, foul odor or excessive pain. If this occurs, please call the office at [phone], to speak with the nurse. Get help right away if your puncture site is bleeding and the bleeding does not stop after applying firm pressure to the area.  You may continue to have skipped beats/ atrial fibrillation during the first several months after your procedure.  It is very important not to miss any doses of your blood thinner Eliquis .  You will follow up with the APP on 07/28/24 and follow up with the APP on 10/04/24.   Patient verbalized understanding to all instructions provided.

## 2024-07-01 ENCOUNTER — Other Ambulatory Visit

## 2024-07-01 DIAGNOSIS — I10 Essential (primary) hypertension: Secondary | ICD-10-CM

## 2024-07-01 DIAGNOSIS — I4891 Unspecified atrial fibrillation: Secondary | ICD-10-CM | POA: Diagnosis not present

## 2024-07-01 DIAGNOSIS — E7800 Pure hypercholesterolemia, unspecified: Secondary | ICD-10-CM

## 2024-07-01 DIAGNOSIS — R931 Abnormal findings on diagnostic imaging of heart and coronary circulation: Secondary | ICD-10-CM

## 2024-07-01 DIAGNOSIS — N289 Disorder of kidney and ureter, unspecified: Secondary | ICD-10-CM | POA: Diagnosis not present

## 2024-07-01 DIAGNOSIS — Z125 Encounter for screening for malignant neoplasm of prostate: Secondary | ICD-10-CM | POA: Diagnosis not present

## 2024-07-01 LAB — LIPID PANEL
Cholesterol: 139 mg/dL (ref <200)
HDL: 58 mg/dL (ref >=40)
LDL Cholesterol (Calc): 59 mg/dL
Non-HDL Cholesterol (Calc): 81 mg/dL (ref <130)
Total CHOL/HDL Ratio: 2.4 (calc) (ref <5.0)
Triglycerides: 138 mg/dL (ref <150)

## 2024-07-01 LAB — CBC WITH DIFFERENTIAL/PLATELET
Absolute Lymphocytes: 3232 {cells}/uL (ref 850–3900)
Absolute Monocytes: 633 {cells}/uL (ref 200–950)
Basophils Absolute: 102 {cells}/uL (ref 0–200)
Basophils Relative: 0.9 %
Eosinophils Absolute: 113 {cells}/uL (ref 15–500)
Eosinophils Relative: 1 %
HCT: 40.1 % (ref 38.5–50.0)
Hemoglobin: 12.4 g/dL — ABNORMAL LOW (ref 13.2–17.1)
MCH: 27.1 pg (ref 27.0–33.0)
MCHC: 30.9 g/dL — ABNORMAL LOW (ref 32.0–36.0)
MCV: 87.7 fL (ref 80.0–100.0)
MPV: 11.3 fL (ref 7.5–12.5)
Monocytes Relative: 5.6 %
Neutro Abs: 7221 {cells}/uL (ref 1500–7800)
Neutrophils Relative %: 63.9 %
Platelets: 192 Thousand/uL (ref 140–400)
RBC: 4.57 Million/uL (ref 4.20–5.80)
RDW: 16 % — ABNORMAL HIGH (ref 11.0–15.0)
Total Lymphocyte: 28.6 %
WBC: 11.3 Thousand/uL — ABNORMAL HIGH (ref 3.8–10.8)

## 2024-07-01 LAB — COMPLETE METABOLIC PANEL WITHOUT GFR
AG Ratio: 1.7 (calc) (ref 1.0–2.5)
ALT: 50 U/L — ABNORMAL HIGH (ref 9–46)
AST: 48 U/L — ABNORMAL HIGH (ref 10–35)
Albumin: 4.2 g/dL (ref 3.6–5.1)
Alkaline phosphatase (APISO): 50 U/L (ref 35–144)
BUN: 14 mg/dL (ref 7–25)
CO2: 28 mmol/L (ref 20–32)
Calcium: 9.3 mg/dL (ref 8.6–10.3)
Chloride: 106 mmol/L (ref 98–110)
Creat: 1.08 mg/dL (ref 0.70–1.35)
Globulin: 2.5 g/dL (ref 1.9–3.7)
Glucose, Bld: 107 mg/dL — ABNORMAL HIGH (ref 65–99)
Potassium: 3.8 mmol/L (ref 3.5–5.3)
Sodium: 142 mmol/L (ref 135–146)
Total Bilirubin: 0.3 mg/dL (ref 0.2–1.2)
Total Protein: 6.7 g/dL (ref 6.1–8.1)

## 2024-07-01 LAB — PSA: PSA: 0.23 ng/mL (ref <=4.00)

## 2024-07-01 MED FILL — Fentanyl Citrate Preservative Free (PF) Inj 100 MCG/2ML: INTRAMUSCULAR | Qty: 2 | Status: AC

## 2024-07-04 ENCOUNTER — Ambulatory Visit: Payer: Self-pay | Admitting: Family Medicine

## 2024-07-05 ENCOUNTER — Encounter: Admitting: Family Medicine

## 2024-07-13 ENCOUNTER — Encounter: Payer: Self-pay | Admitting: Family Medicine

## 2024-07-13 ENCOUNTER — Ambulatory Visit (HOSPITAL_COMMUNITY)
Admission: RE | Admit: 2024-07-13 | Discharge: 2024-07-13 | Disposition: A | Discharge status: Home / Self Care | Source: Ambulatory Visit | Attending: Cardiology | Admitting: Cardiology

## 2024-07-13 ENCOUNTER — Ambulatory Visit: Admitting: Family Medicine

## 2024-07-13 ENCOUNTER — Encounter: Admitting: Family Medicine

## 2024-07-13 VITALS — BP 128/72 | HR 60 | Temp 97.8°F | Ht 74.0 in | Wt 248.4 lb

## 2024-07-13 DIAGNOSIS — I4891 Unspecified atrial fibrillation: Secondary | ICD-10-CM

## 2024-07-13 DIAGNOSIS — I428 Other cardiomyopathies: Secondary | ICD-10-CM | POA: Diagnosis not present

## 2024-07-13 DIAGNOSIS — I5022 Chronic systolic (congestive) heart failure: Secondary | ICD-10-CM | POA: Insufficient documentation

## 2024-07-13 DIAGNOSIS — R710 Precipitous drop in hematocrit: Secondary | ICD-10-CM | POA: Diagnosis not present

## 2024-07-13 DIAGNOSIS — Z1211 Encounter for screening for malignant neoplasm of colon: Secondary | ICD-10-CM | POA: Insufficient documentation

## 2024-07-13 DIAGNOSIS — E7800 Pure hypercholesterolemia, unspecified: Secondary | ICD-10-CM | POA: Diagnosis not present

## 2024-07-13 DIAGNOSIS — I10 Essential (primary) hypertension: Secondary | ICD-10-CM

## 2024-07-13 DIAGNOSIS — Z0000 Encounter for general adult medical examination without abnormal findings: Secondary | ICD-10-CM

## 2024-07-13 DIAGNOSIS — Z125 Encounter for screening for malignant neoplasm of prostate: Secondary | ICD-10-CM

## 2024-07-13 LAB — COMPREHENSIVE METABOLIC PANEL WITH GFR
ALT: 46 U/L — ABNORMAL HIGH (ref 0–44)
AST: 36 U/L (ref 15–41)
Albumin: 3.8 g/dL (ref 3.5–5.0)
Alkaline Phosphatase: 55 U/L (ref 38–126)
Anion gap: 10 (ref 5–15)
BUN: 12 mg/dL (ref 8–23)
CO2: 26 mmol/L (ref 22–32)
Calcium: 9.2 mg/dL (ref 8.9–10.3)
Chloride: 105 mmol/L (ref 98–111)
Creatinine, Ser: 1.33 mg/dL — ABNORMAL HIGH (ref 0.61–1.24)
GFR, Estimated: >60 mL/min (ref >60)
Glucose, Bld: 124 mg/dL — ABNORMAL HIGH (ref 70–99)
Potassium: 4.3 mmol/L (ref 3.5–5.1)
Sodium: 141 mmol/L (ref 135–145)
Total Bilirubin: 0.7 mg/dL (ref 0.0–1.2)
Total Protein: 7.3 g/dL (ref 6.5–8.1)

## 2024-07-13 LAB — CBC WITH DIFFERENTIAL/PLATELET
Absolute Lymphocytes: 2438 {cells}/uL (ref 850–3900)
Absolute Monocytes: 1091 {cells}/uL — ABNORMAL HIGH (ref 200–950)
Basophils Absolute: 153 {cells}/uL (ref 0–200)
Basophils Relative: 1.5 %
Eosinophils Absolute: 326 {cells}/uL (ref 15–500)
Eosinophils Relative: 3.2 %
HCT: 40.1 % (ref 38.5–50.0)
Hemoglobin: 12.8 g/dL — ABNORMAL LOW (ref 13.2–17.1)
MCH: 27.9 pg (ref 27.0–33.0)
MCHC: 31.9 g/dL — ABNORMAL LOW (ref 32.0–36.0)
MCV: 87.6 fL (ref 80.0–100.0)
MPV: 11.4 fL (ref 7.5–12.5)
Monocytes Relative: 10.7 %
Neutro Abs: 6191 {cells}/uL (ref 1500–7800)
Neutrophils Relative %: 60.7 %
Platelets: 250 Thousand/uL (ref 140–400)
RBC: 4.58 Million/uL (ref 4.20–5.80)
RDW: 15.9 % — ABNORMAL HIGH (ref 11.0–15.0)
Total Lymphocyte: 23.9 %
WBC: 10.2 Thousand/uL (ref 3.8–10.8)

## 2024-07-13 LAB — LIPID PANEL
Cholesterol: 137 mg/dL (ref 0–200)
HDL: 49 mg/dL (ref >40)
LDL Cholesterol: 61 mg/dL (ref 0–99)
Total CHOL/HDL Ratio: 2.8 ratio
Triglycerides: 136 mg/dL (ref <150)
VLDL: 27 mg/dL (ref 0–40)

## 2024-07-13 NOTE — Progress Notes (Signed)
 Subjective:     Patient ID: Jason Fleming, male   DOB: [birth year], 62 y.o.   MRN: [id number]  Patient was recently admitted to the hospital with atrial fibrillation with rapid ventricular response.  Initially they started him on Cardizem  drip and transition to metoprolol .  However the patient had profound hypotension and cardiogenic shock.  He was transferred to [facility].  Echocardiogram revealed ejection fraction of 25 to 30%.  At that point he was transitioned to amiodarone  and digoxin  for rate control.  Cardioversion was performed the patient was discharged in normal sinus rhythm.  He was started on spironolactone  and Entresto  for heart failure given the suppressed ejection fraction  07/13/24 Patient is here today for a complete physical exam.  Since I last saw the patient, he underwent an ablation.  He remains in normal sinus rhythm today.  He is on digoxin  and amiodarone  but has an appointment upcoming to see his cardiologist to determine if this medication will be maintained.  He is also on Entresto , spironolactone  and Farxiga , and metoprolol  for congestive heart failure.  He is not having to use torsemide  anymore.  Instead he is weighing himself every day and his weight has been maintained and stable.  He still has torsemide  if he starts to swell that he has not had to use the medication.  He has a repeat echocardiogram planned for November to reassess his ejection fraction.  His last colonoscopy was in 2023 and I recommended a repeat colonoscopy in 2033.  He had a PSA checked earlier this month that was outstanding.  He is due for several immunizations.  The most important will be his pneumonia shot along with the shingles vaccine.  We also discussed a flu shot, COVID shot, and RSV vaccine.  Tetanus is good until next year.  I reviewed his lab work.  Comparing his hemoglobin from July until now, his hemoglobin has dropped by 1 point without explanation Past Medical History:  Diagnosis Date    Anxiety    Atrial fibrillation (HCC)    Depression    Hypertension    Paroxysmal atrial fibrillation (HCC)    cardioversion in ed 05/2018   Past Surgical History:  Procedure Laterality Date   ATRIAL FIBRILLATION ABLATION N/A 06/29/2024   Procedure: ATRIAL FIBRILLATION ABLATION;  Surgeon: Cindie Ole DASEN, MD;  Location: [facility] INVASIVE CV LAB;  Service: Cardiovascular;  Laterality: N/A;   CARDIOVERSION N/A 04/29/2024   Procedure: CARDIOVERSION;  Surgeon: Rolan Ezra RAMAN, MD;  Location: [facility] INVASIVE CV LAB;  Service: Cardiovascular;  Laterality: N/A;   CARDIOVERSION N/A 05/14/2024   Procedure: CARDIOVERSION;  Surgeon: Gardenia Led, DO;  Location: [facility] INVASIVE CV LAB;  Service: Cardiovascular;  Laterality: N/A;   CARPAL TUNNEL RELEASE     COLONOSCOPY  2013   Dr.Hung-normal exam   MASS EXCISION  05/26/2012   Procedure: MINOR EXCISION OF MASS;  Surgeon: Lamar LULLA Leonor Mickey., MD;  Location: [facility];  Service: Orthopedics;  Laterality: Left;   RIGHT/LEFT HEART CATH AND CORONARY ANGIOGRAPHY N/A 04/28/2024   Procedure: RIGHT/LEFT HEART CATH AND CORONARY ANGIOGRAPHY;  Surgeon: Rolan Ezra RAMAN, MD;  Location: [facility] INVASIVE CV LAB;  Service: Cardiovascular;  Laterality: N/A;   TONSILLECTOMY     TRANSESOPHAGEAL ECHOCARDIOGRAM (CATH LAB) N/A 04/29/2024   Procedure: TRANSESOPHAGEAL ECHOCARDIOGRAM;  Surgeon: Rolan Ezra RAMAN, MD;  Location: [facility] INVASIVE CV LAB;  Service: Cardiovascular;  Laterality: N/A;   ULNAR NERVE REPAIR Bilateral    WISDOM TOOTH EXTRACTION  Current Outpatient Medications on File Prior to Visit  Medication Sig Dispense Refill   amiodarone  (PACERONE ) 200 MG tablet Take 1 tablet (200 mg total) by mouth 2 (two) times daily. 180 tablet 3   apixaban  (ELIQUIS ) 5 MG TABS tablet Take 1 tablet (5 mg total) by mouth 2 (two) times daily. 180 tablet 3   dapagliflozin  propanediol (FARXIGA ) 10 MG TABS tablet Take 1 tablet (10 mg total) by mouth daily. 90 tablet 3   digoxin   (LANOXIN ) 0.125 MG tablet Take 1 tablet (0.125 mg total) by mouth daily. 90 tablet 3   metoprolol  succinate (TOPROL  XL) 25 MG 24 hr tablet Take 1 tablet (25 mg total) by mouth at bedtime. 90 tablet 3   Multiple Vitamin (MULTIVITAMIN) tablet Take 1 tablet by mouth daily.     Omega-3 Fatty Acids (FISH OIL PO) Take 1 capsule by mouth daily.     rosuvastatin  (CRESTOR ) 20 MG tablet Take 1 tablet (20 mg total) by mouth daily. 90 tablet 3   sacubitril -valsartan  (ENTRESTO ) 24-26 MG Take 1 tablet by mouth 2 (two) times daily. 180 tablet 3   spironolactone  (ALDACTONE ) 25 MG tablet Take 0.5 tablets (12.5 mg total) by mouth daily. 45 tablet 3   venlafaxine  XR (EFFEXOR -XR) 150 MG 24 hr capsule TAKE 1 CAPSULE BY MOUTH DAILY  WITH BREAKFAST (NEED APPT WITH  PCP) 30 capsule 11   colchicine  0.6 MG tablet Take 1 tablet (0.6 mg total) by mouth 2 (two) times daily for 5 days. (Patient not taking: Reported on 07/13/2024) 10 tablet 0   pantoprazole  (PROTONIX ) 40 MG tablet Take 1 tablet (40 mg total) by mouth daily. (Patient not taking: Reported on 07/13/2024) 45 tablet 0   torsemide  (DEMADEX ) 20 MG tablet Take 1 tablet (20 mg total) by mouth daily as needed. (Patient not taking: Reported on 07/13/2024) 30 tablet 3   No current facility-administered medications on file prior to visit.   No Known Allergies Social History   Socioeconomic History   Marital status: Married    Spouse name: Not on file   Number of children: Not on file   Years of education: Not on file   Highest education level: 12th grade  Occupational History   Not on file  Tobacco Use   Smoking status: Former    Current packs/day: 0.00    Types: E-cigarettes, Cigarettes    Quit date: 03/02/2012    Years since quitting: 12.3   Smokeless tobacco: Never  Vaping Use   Vaping status: Former  Substance and Sexual Activity   Alcohol use: Yes    Alcohol/week: 3.0 standard drinks of alcohol    Types: 3 Cans of beer per week   Drug use: No   Sexual  activity: Not Currently    Birth control/protection: None  Other Topics Concern   Not on file  Social History Narrative   Not on file   Social Drivers of Health   Financial Resource Strain: Low Risk  (07/12/2024)   Overall Financial Resource Strain (CARDIA)    Difficulty of Paying Living Expenses: Not hard at all  Food Insecurity: No Food Insecurity (07/12/2024)   Hunger Vital Sign    Worried About Running Out of Food in the Last Year: Never true    Ran Out of Food in the Last Year: Never true  Transportation Needs: No Transportation Needs (07/12/2024)   PRAPARE - Administrator, Civil Service (Medical): No    Lack of Transportation (Non-Medical): No  Physical Activity:  Inactive (07/12/2024)   Exercise Vital Sign    Days of Exercise per Week: 0 days    Minutes of Exercise per Session: Not on file  Stress: Stress Concern Present (07/12/2024)   [employer] of Occupational Health - Occupational Stress Questionnaire    Feeling of Stress: To some extent  Social Connections: Socially Integrated (07/12/2024)   Social Connection and Isolation Panel    Frequency of Communication with Friends and Family: More than three times a week    Frequency of Social Gatherings with Friends and Family: Once a week    Attends Religious Services: More than 4 times per year    Active Member of [employer] or Organizations: Yes    Attends Engineer, structural: More than 4 times per year    Marital Status: Married  Catering manager Violence: Not At Risk (04/26/2024)   Humiliation, Afraid, Rape, and Kick questionnaire    Fear of Current or Ex-Partner: No    Emotionally Abused: No    Physically Abused: No    Sexually Abused: No   Family History  Problem Relation Age of Onset   Anxiety disorder Mother    Hypertension Mother    Depression Father    Heart disease Father    CAD Father        CABG x 3 in 2000   Stroke Maternal Grandfather    Alcohol abuse Maternal Uncle    Alcohol abuse  Paternal Uncle    Colon cancer Neg Hx    Colon polyps Neg Hx    Esophageal cancer Neg Hx    Stomach cancer Neg Hx    Rectal cancer Neg Hx     Review of Systems  Gastrointestinal:  Positive for abdominal pain.  All other systems reviewed and are negative.      Objective:   Physical Exam Vitals reviewed.  Constitutional:      General: He is not in acute distress.    Appearance: Normal appearance. He is well-developed. He is not ill-appearing, toxic-appearing or diaphoretic.  HENT:     Head: Normocephalic and atraumatic.  Eyes:     Conjunctiva/sclera: Conjunctivae normal.     Pupils: Pupils are equal, round, and reactive to light.  Neck:     Thyroid : No thyromegaly.     Vascular: No JVD.  Cardiovascular:     Rate and Rhythm: Normal rate and regular rhythm.     Heart sounds: No murmur heard.    No friction rub. No gallop.  Pulmonary:     Effort: Pulmonary effort is normal. No respiratory distress.     Breath sounds: Normal breath sounds. No stridor. No wheezing or rales.  Chest:     Chest wall: No tenderness.  Abdominal:     General: Bowel sounds are normal. There is no distension.     Palpations: Abdomen is soft. There is no mass.     Tenderness: There is no abdominal tenderness. There is no guarding or rebound.     Hernia: No hernia is present.  Musculoskeletal:     Right lower leg: No edema.     Left lower leg: No edema.  Skin:    Findings: No rash.  Neurological:     Mental Status: He is alert and oriented to person, place, and time.     Cranial Nerves: No cranial nerve deficit.     Motor: No abnormal muscle tone.     Coordination: Coordination normal.     Deep Tendon Reflexes: Reflexes normal.  Psychiatric:        Mood and Affect: Mood normal.        Behavior: Behavior normal.      Assessment:     Hemoglobin drop - Plan: CBC with Differential/Platelet  Nonischemic cardiomyopathy (HCC)  Atrial fibrillation, unspecified type (HCC)  Pure  hypercholesterolemia  Benign essential HTN  Prostate cancer screening  General medical exam   Plan:  First regarding his immunizations, I strongly recommended the pneumonia vaccine/Prevnar 20.  Also recommended the shingles vaccine.  I encouraged the patient to get RSV, flu, and COVID.  He politely declines any vaccinations today.  Second regarding cancer screening, he is due for a colonoscopy in 2033.  His PSA is up-to-date and excellent.  Third regarding his blood pressure and his cholesterol they are outstanding.  He has undergone an ablation and is currently in normal sinus rhythm.  He remains appropriately anticoagulated with Eliquis .  However given the drop in his hemoglobin I have recommended repeating a CBC.  If there is a persistent drop that continues to worsen we may need to investigate for an occult blood loss from a GI source.  Patient is on maximum goal-directed medical therapy for congestive heart failure.  I will defer this to cardiology.  His renal function and potassium remain within normal limits.  I did encourage the patient to drink a little bit more water as his creatinine this morning was slightly elevated at 1.3.  Regarding his digoxin  and amiodarone , I will defer to cardiology about when these medications can be discontinued.

## 2024-07-27 NOTE — Progress Notes (Unsigned)
 Electrophysiology Clinic Note    Date:  07/28/2024  Patient ID:  Jason Fleming [birth year], MRN [id number] PCP:  Duanne Butler DASEN, MD  Cardiologist:  Annabella Scarce, MD   Cardiology APP:  Percy Rosaline HERO, NP  Electrophysiologist:  OLE DASEN HOLTS, MD  Electrophysiology APP:  Ethelbert Thain, NP     Discussed the use of AI scribe software for clinical note transcription with the patient, who gave verbal consent to proceed.   Patient Profile    Chief Complaint: AF ablation follow-up  History of Present Illness: Jason Fleming is a 63 y.o. male with PMH notable for persis AFib, HFrEF; seen today for OLE DASEN HOLTS, MD for routine electrophysiology follow-up s/p AF Ablation.  He was hospitalized 04/2024 for AFib, found to have significantly reduced LVEF. He was started on amiodarone  and s/p DCCV.  He saw HF team in follow-up 05/2024 where GDMT was continued with addition of metop.  He is s/p AF ablation w isolation of pulm veins, posterior wall on 06/29/2024 by Dr. HOLTS.   On follow-up today, he feels very well. Historically, he is asymptomatic of AFib with some SOB that prompted his admission earlier this year.  He is not aware of any AFib episodes, he is able to breath much easier and walk without concerning symptoms. He does not check BP regularly at home. Weight is stable, has not needed torsemide  in many months. He continues to take eliquis  BID, no bleeding concerns. He  believes his bilat groins are well-healed but has not looked at them in several weeks. He denies edema.      Arrhythmia/Device History Amiodarone     ROS:  Please see the history of present illness. All other systems are reviewed and otherwise negative.    Physical Exam    VS:  BP 136/62   Pulse (!) 56   Ht 6' 2 (1.88 m)   Wt 252 lb (114.3 kg)   SpO2 98%   BMI 32.35 kg/m  BMI: Body mass index is 32.35 kg/m.      Wt Readings from Last 3 Encounters:  07/28/24 252 lb (114.3 kg)   07/13/24 248 lb 6.4 oz (112.7 kg)  06/29/24 245 lb (111.1 kg)     GEN- The patient is well appearing, alert and oriented x 3 today.   Lungs- Clear to ausculation bilaterally, normal work of breathing.  Heart- Regular rate and rhythm, no murmurs, rubs or gallops Extremities- No peripheral edema, warm, dry   Studies Reviewed   Previous EP, cardiology notes.    EKG is ordered. Personal review of EKG from today shows:          Cardiac CT, 06/08/2024 1. There is normal pulmonary vein drainage into the left atrium. 3 on the right and 2 on the left) with ostial measurements as above.  2. The left atrial appendage is a chicken wing type with two lobes and ostial size 22 x 16 mm and length 32 mm, Area 28 mm2. There is no thrombus in the left atrial appendage.  3. The esophagus runs in the left atrial midline and is not in the proximity to any of the pulmonary veins.  4. Coronary calcium  score 488. This is 87th percentile for age/gender.  Cardiac MRI, 05/03/2024 1. Mildly dilated LV with LV EF 30%, function looked worse in the septum.  2.  Normal RV size with RV EF 35%.  3.  Moderate mitral regurgitation, regurgitant fraction 21%.  4.  Normal  extracellular volume percentage and normal global T2.  TEE, 04/29/2024 1. Left ventricular ejection fraction, by estimation, is 25 to 30%. The left ventricle has severely decreased function. The left ventricle demonstrates global hypokinesis.   2. Right ventricular systolic function is mildly reduced. The right ventricular size is normal.   3. Peak RV-RA gradient 15 mmHg.   4. Left atrial size was mildly dilated. No left atrial/left atrial appendage thrombus was detected.   5. Right atrial size was mildly dilated.   6. No PFO/ASD by color doppler.   7. The aortic valve is tricuspid. Aortic valve regurgitation is trivial. No aortic stenosis is present.   8. There were two jets of mitral regurgitation, overall mitral regurgitation was likely moderate.  Suspect functional related to atrial fibrillation. PISA ERO of the combined jets only calculated to 0.17 cm^2. No evidence of mitral stenosis.   R/LHC, 04/28/2024 1.  Mild nonobstructive CAD.  Nonischemic cardiomyopathy.  2. Elevated filling pressures. 3. Preserved cardiac output but low PAPi suggests RV dysfunction.  4. Mild pulmonary venous hypertension.   TTE, 04/27/2024  1. Left ventricular ejection fraction, by estimation, is 25 to 30%. The left ventricle has moderately decreased function. Left ventricular endocardial border not optimally defined to evaluate regional wall motion. Left ventricular diastolic parameters are indeterminate.   2. RV not well visualized. Appears enlarged with roughly moderate systolic dysfunction. Indeterminate PASP, IVC not visualized. Right ventricular systolic function was not well visualized. The right ventricular size is not well visualized.   3. The mitral valve is abnormal. Moderate mitral valve regurgitation. No evidence of mitral stenosis.   4. The aortic valve is tricuspid. Aortic valve regurgitation is not visualized. No aortic stenosis is present.   5. Limited study, patient uncomfortable with positioning and asked to end study.   TTE, 06/12/2018 - Left ventricle: The cavity size was mildly dilated. Systolic function was normal. The estimated ejection fraction was in the range of 55% to 60%. Wall motion was normal; there were no regional wall motion abnormalities. Left ventricular diastolic function parameters were normal.  - Aorta: Aortic root dimension: 40 mm (ED).  - Aortic root: The aortic root was mildly dilated.  - Mitral valve: Transvalvular velocity was within the normal range. There was no evidence for stenosis.  - Pulmonary arteries: Systolic pressure could not be accurately estimated.   Assessment and Plan     #) persis AFib #) amiodarone  monitoring S/p AF ablation 06/2024, appears to be maintaining sinus since Reduce amiodarone  to 200mg   daily Continue 25mg  toprol , 0.125mg  digoxin  Update thyroid  labs today Recent LFTs stable  #) Hypercoag d/t afib CHA2DS2-VASc Score = at least 2 [CHF History: 1, HTN History: 1, Diabetes History: 0, Stroke History: 0, Vascular Disease History: 0, Age Score: 0, Gender Score: 0].  Therefore, the patient's annual risk of stroke is 2.2 %.    Stroke ppx - 5mg  eliquis  BID, appropriately dosed No bleeding concerns  #) HFrEF Appears warm and euvolemic on exam, weight stable by home scale GDMT - 10mg  farxiga , 25mg  toprol , 24-26 entresto , 12.5mg  spiro Follows with HF team, planning TTE in ~1 month w appt, will help facilitate appt We briefly discussed that if LVEF remains low, may need ICD  #) HTN Well-controlled today in office Recommended he check BP 2-3 times per week at home and record measurements. Bring BP log to follow-up appts      Current medicines are reviewed at length with the patient today.   The patient does not  have concerns regarding his medicines.  The following changes were made today:   REDUCE amiodarone  to 200mg  daily  Labs/ tests ordered today include:  Orders Placed This Encounter  Procedures   TSH   T4, free   EKG 12-Lead     Disposition: Follow up with Dr. Cindie or EP APP in 2 months    Signed, Chantal Needle, NP  07/28/24  11:56 AM  Electrophysiology [facility]

## 2024-07-28 ENCOUNTER — Encounter: Payer: Self-pay | Admitting: Cardiology

## 2024-07-28 ENCOUNTER — Ambulatory Visit: Attending: Cardiology | Admitting: Cardiology

## 2024-07-28 VITALS — BP 136/62 | HR 56 | Ht 74.0 in | Wt 252.0 lb

## 2024-07-28 DIAGNOSIS — D6869 Other thrombophilia: Secondary | ICD-10-CM

## 2024-07-28 DIAGNOSIS — I4819 Other persistent atrial fibrillation: Secondary | ICD-10-CM | POA: Diagnosis not present

## 2024-07-28 DIAGNOSIS — I10 Essential (primary) hypertension: Secondary | ICD-10-CM

## 2024-07-28 DIAGNOSIS — Z79899 Other long term (current) drug therapy: Secondary | ICD-10-CM

## 2024-07-28 DIAGNOSIS — I5020 Unspecified systolic (congestive) heart failure: Secondary | ICD-10-CM | POA: Diagnosis not present

## 2024-07-28 DIAGNOSIS — I5022 Chronic systolic (congestive) heart failure: Secondary | ICD-10-CM

## 2024-07-28 MED ORDER — AMIODARONE HCL 200 MG PO TABS: 200.0000 mg | ORAL_TABLET | Freq: Every day | ORAL | Status: DC

## 2024-07-28 MED ORDER — SPIRONOLACTONE 25 MG PO TABS: 12.5000 mg | ORAL_TABLET | Freq: Every day | ORAL | 3 refills | Status: AC

## 2024-07-28 NOTE — Patient Instructions (Signed)
 Medication Instructions:   Decrease Amiodarone  to 200 MG daily.  *If you need a refill on your cardiac medications before your next appointment, please call your pharmacy*  Lab Work: Your provider would like for you to have following labs drawn today TSH and T4.   If you have labs (blood work) drawn today and your tests are completely normal, you will receive your results only by: MyChart Message (if you have MyChart) OR A paper copy in the mail If you have any lab test that is abnormal or we need to change your treatment, we will call you to review the results.  Testing/Procedures: Your physician has requested that you have an echocardiogram. Echocardiography is a painless test that uses sound waves to create images of your heart. It provides your doctor with information about the size and shape of your heart and how well your heart's chambers and valves are working.   You may receive an ultrasound enhancing agent through an IV if needed to better visualize your heart during the echo. This procedure takes approximately one hour.  There are no restrictions for this procedure.  This will take place at [address] [facility]) [address], [place] [address]  Please note: We ask at that you not bring children with you during ultrasound (echo/ vascular) testing. Due to room size and safety concerns, children are not allowed in the ultrasound rooms during exams. Our front office staff cannot provide observation of children in our lobby area while testing is being conducted. An adult accompanying a patient to their appointment will only be allowed in the ultrasound room at the discretion of the ultrasound technician under special circumstances. We apologize for any inconvenience.   Follow-Up: At [facility], you and your health needs are our priority.  As part of our continuing mission to provide you with exceptional heart care, our providers are all part of one team.  This  team includes your primary Cardiologist (physician) and Advanced Practice Providers or APPs (Physician Assistants and Nurse Practitioners) who all work together to provide you with the care you need, when you need it.  Your next appointment:   Suzann Riddle, NP on October 04, 2024 at 1:00 pm  Provider:   Suzann Riddle, NP    We recommend signing up for the patient portal called MyChart.  Sign up information is provided on this After Visit Summary.  MyChart is used to connect with patients for Virtual Visits (Telemedicine).  Patients are able to view lab/test results, encounter notes, upcoming appointments, etc.  Non-urgent messages can be sent to your provider as well.   To learn more about what you can do with MyChart, go to [email or web address].   Other Instructions  Please scheduled Echocardiogram and then appointment with Heart Failure Clinic

## 2024-07-29 ENCOUNTER — Ambulatory Visit: Payer: Self-pay | Admitting: Cardiology

## 2024-07-29 LAB — T4, FREE: Free T4: 1.02 ng/dL (ref 0.82–1.77)

## 2024-07-29 LAB — TSH: TSH: 1.96 u[IU]/mL (ref 0.450–4.500)

## 2024-07-29 NOTE — Progress Notes (Signed)
 Last read by Burnard LOISE Chancy at 8:07AM on 07/29/2024.

## 2024-08-11 ENCOUNTER — Encounter: Admitting: Cardiology

## 2024-08-24 ENCOUNTER — Encounter: Payer: Self-pay | Admitting: Family Medicine

## 2024-09-09 ENCOUNTER — Telehealth: Payer: Self-pay

## 2024-09-09 ENCOUNTER — Ambulatory Visit: Attending: Family Medicine

## 2024-09-09 DIAGNOSIS — I5022 Chronic systolic (congestive) heart failure: Secondary | ICD-10-CM | POA: Diagnosis not present

## 2024-09-09 LAB — ECHOCARDIOGRAM COMPLETE
Area-P 1/2: 1.91 cm2
S' Lateral: 4.6 cm

## 2024-09-09 NOTE — Telephone Encounter (Signed)
 Patient arrived for echocardiogram, referred by Harlene Gainer, NP from [facility]. No authorization was received. Patient verbalized he would like to proceed with the test, while awaiting authorization.

## 2024-09-28 ENCOUNTER — Telehealth (HOSPITAL_COMMUNITY): Payer: Self-pay | Admitting: Cardiology

## 2024-10-03 NOTE — Progress Notes (Unsigned)
 Electrophysiology Clinic Note    Date:  10/03/2024  Patient ID:  Jason Fleming, [place] 02/07/1961, MRN [id number] PCP:  Duanne Butler DASEN, MD  Cardiologist:  Annabella Scarce, MD   Cardiology APP:  Percy Rosaline HERO, NP  Electrophysiologist:  OLE DASEN HOLTS, MD  Electrophysiology APP:  Sharda Keddy, NP     Discussed the use of AI scribe software for clinical note transcription with the patient, who gave verbal consent to proceed.   Patient Profile    Chief Complaint: AF ablation follow-up  History of Present Illness: BRAVE Jason Fleming is a 63 y.o. male with PMH notable for persis AFib, HFimpEF (thought tachy-mediated), HTN, ; seen today for OLE DASEN HOLTS, MD for routine electrophysiology follow-up s/p AF Ablation.  He was hospitalized 04/2024 for AFib, found to have significantly reduced LVEF. He was started on amiodarone  and s/p DCCV.  He saw HF team in follow-up 05/2024 where GDMT was continued with addition of metop.  He is s/p AF ablation w isolation of pulm veins, posterior wall on 06/29/2024 by Dr. Holts.   I saw him for 1 month AF ablation follow-up 07/2024 where he was maintaining sinus rhythm. He had routine TTE 08/2024 where LVEF had improved to 55-60%.   On follow-up today, *** AF burden, symptoms *** palpitations *** bleeding concerns   - stop amio? - needs gen cards   Arrhythmia/Device History Amiodarone     ROS:  Please see the history of present illness. All other systems are reviewed and otherwise negative.    Physical Exam    VS:  There were no vitals taken for this visit. BMI: There is no height or weight on file to calculate BMI.      Wt Readings from Last 3 Encounters:  07/28/24 252 lb (114.3 kg)  07/13/24 248 lb 6.4 oz (112.7 kg)  06/29/24 245 lb (111.1 kg)     GEN- The patient is well appearing, alert and oriented x 3 today.   Lungs- Clear to ausculation bilaterally, normal work of breathing.  Heart- Regular rate and rhythm,  no murmurs, rubs or gallops Extremities- No peripheral edema, warm, dry   Studies Reviewed   Previous EP, cardiology notes.    EKG is ordered. Personal review of EKG from today shows:          TTE, 09/09/2024 1. Left ventricular ejection fraction, by estimation, is 55 to 60%. The left ventricle has normal function. The left ventricle has no regional wall motion abnormalities. There is mild left ventricular hypertrophy. Left ventricular diastolic parameters are indeterminate.  2. Right ventricular systolic function is normal. The right ventricular  size is normal.  3. The mitral valve is normal in structure. Mild mitral valve regurgitation. No evidence of mitral stenosis.  4. The aortic valve is normal in structure. Aortic valve regurgitation is not visualized. No aortic stenosis is present.  5. The inferior vena cava is normal in size with greater than 50%  respiratory variability, suggesting right atrial pressure of 3 mmHg.   Comparison(s): 04/27/2024 EF 25-35%.   Cardiac CT, 06/08/2024 1. There is normal pulmonary vein drainage into the left atrium. 3 on the right and 2 on the left) with ostial measurements as above.  2. The left atrial appendage is a chicken wing type with two lobes and ostial size 22 x 16 mm and length 32 mm, Area 28 mm2. There is no thrombus in the left atrial appendage.  3. The esophagus runs in the left  atrial midline and is not in the proximity to any of the pulmonary veins.  4. Coronary calcium  score 488. This is 87th percentile for age/gender.  Cardiac MRI, 05/03/2024 1. Mildly dilated LV with LV EF 30%, function looked worse in the septum.  2.  Normal RV size with RV EF 35%.  3.  Moderate mitral regurgitation, regurgitant fraction 21%.  4.  Normal extracellular volume percentage and normal global T2.  TEE, 04/29/2024 1. Left ventricular ejection fraction, by estimation, is 25 to 30%. The left ventricle has severely decreased function. The left ventricle  demonstrates global hypokinesis.  2. Right ventricular systolic function is mildly reduced. The right ventricular size is normal.  3. Peak RV-RA gradient 15 mmHg.  4. Left atrial size was mildly dilated. No left atrial/left atrial appendage thrombus was detected.  5. Right atrial size was mildly dilated.  6. No PFO/ASD by color doppler.  7. The aortic valve is tricuspid. Aortic valve regurgitation is trivial. No aortic stenosis is present.  8. There were two jets of mitral regurgitation, overall mitral regurgitation was likely moderate. Suspect functional related to atrial fibrillation. PISA ERO of the combined jets only calculated to 0.17 cm^2. No evidence of mitral stenosis.   R/LHC, 04/28/2024 1. Mild nonobstructive CAD.  Nonischemic cardiomyopathy.  2. Elevated filling pressures. 3. Preserved cardiac output but low PAPi suggests RV dysfunction.  4. Mild pulmonary venous hypertension.   TTE, 04/27/2024  1. Left ventricular ejection fraction, by estimation, is 25 to 30%. The left ventricle has moderately decreased function. Left ventricular endocardial border not optimally defined to evaluate regional wall motion. Left ventricular diastolic parameters are indeterminate.   2. RV not well visualized. Appears enlarged with roughly moderate systolic dysfunction. Indeterminate PASP, IVC not visualized. Right ventricular systolic function was not well visualized. The right ventricular size is not well visualized.   3. The mitral valve is abnormal. Moderate mitral valve regurgitation. No evidence of mitral stenosis.   4. The aortic valve is tricuspid. Aortic valve regurgitation is not visualized. No aortic stenosis is present.   5. Limited study, patient uncomfortable with positioning and asked to end study.   TTE, 06/12/2018 - Left ventricle: The cavity size was mildly dilated. Systolic function was normal. The estimated ejection fraction was in the range of 55% to 60%. Wall motion was normal; there  were no regional wall motion abnormalities. Left ventricular diastolic function parameters were normal.  - Aorta: Aortic root dimension: 40 mm (ED).  - Aortic root: The aortic root was mildly dilated.  - Mitral valve: Transvalvular velocity was within the normal range. There was no evidence for stenosis.  - Pulmonary arteries: Systolic pressure could not be accurately estimated.   Assessment and Plan     #) persis AFib #) amiodarone  monitoring S/p AF ablation 06/2024, appears to be maintaining sinus since *** Reduce amiodarone  to 200mg  daily Continue 25mg  toprol , 0.125mg  digoxin  Update thyroid  labs today Recent LFTs stable  #) Hypercoag d/t afib CHA2DS2-VASc Score = at least 2 [CHF History: 1, HTN History: 1, Diabetes History: 0, Stroke History: 0, Vascular Disease History: 0, Age Score: 0, Gender Score: 0].  Therefore, the patient's annual risk of stroke is 2.2 %.    Stroke ppx - 5mg  eliquis  BID, appropriately dosed No bleeding concerns  #) HFimpEF Recent TTE with improved LVEF 55-60% Appears warm and euvolemic on exam, weight stable by home scale GDMT - 10mg  farxiga , 25mg  toprol , 24-26 entresto , 12.5mg  spiro Follows with HF team, planning TTE in ~  1 month w appt, will help facilitate appt We briefly discussed that if LVEF remains low, may need ICD  #) HTN Well-controlled today in office Recommended he check BP 2-3 times per week at home and record measurements. Bring BP log to follow-up appts      Current medicines are reviewed at length with the patient today.   The patient does not have concerns regarding his medicines.  The following changes were made today:   REDUCE amiodarone  to 200mg  daily  Labs/ tests ordered today include:  No orders of the defined types were placed in this encounter.    Disposition: Follow up with Dr. Cindie or EP APP in 2 months    Signed, Chantal Needle, NP  10/03/24  7:39 PM  Electrophysiology [facility]

## 2024-10-04 ENCOUNTER — Ambulatory Visit: Attending: Cardiology | Admitting: Cardiology

## 2024-10-04 ENCOUNTER — Encounter: Payer: Self-pay | Admitting: Cardiology

## 2024-10-04 VITALS — BP 122/80 | HR 94 | Ht 74.0 in | Wt 258.0 lb

## 2024-10-04 DIAGNOSIS — I4819 Other persistent atrial fibrillation: Secondary | ICD-10-CM | POA: Diagnosis not present

## 2024-10-04 DIAGNOSIS — D6869 Other thrombophilia: Secondary | ICD-10-CM | POA: Diagnosis not present

## 2024-10-04 DIAGNOSIS — I5020 Unspecified systolic (congestive) heart failure: Secondary | ICD-10-CM | POA: Diagnosis not present

## 2024-10-04 DIAGNOSIS — I10 Essential (primary) hypertension: Secondary | ICD-10-CM

## 2024-10-04 NOTE — Progress Notes (Deleted)
  Cardiology Office Note   Date:  10/04/2024  ID:  Jason Fleming, DOB January 10, 1961, MRN [id number] PCP: Jason Butler DASEN, MD  [facility] Providers Cardiologist:  Jason Scarce, MD Cardiology APP:  Jason Rosaline HERO, NP  Electrophysiologist:  Jason Fleming HOLTS, MD  Electrophysiology APP:  Riddle, Suzann, NP { Click to update primary MD,subspecialty MD or APP then REFRESH:1}    History of Present Illness Jason Fleming is a 63 y.o. male PMH paroxysmal atrial fibrillation status post PVI ablation 06/26/2024 ([facility]), nonischemic cardiomyopathy with recovered ejection fraction, nonobstructive CAD, HLD, HTN who presents for ***.  ***.  Last LDL 61 06/2024.  Relevant CVD History -TTE 08/2024 normal biventricular function, mild MR - PVI ablation 06/29/2024 - DCCV 05/14/2024 -CMR 05/03/2024 LVEF 30% with diffuse hypokinesis, RVEF 35%, moderate MR, no LGE - TEE 04/30/2024 LVEF 25 to 30%, mildly reduced RV function, moderate MR - Coronary angiogram 04/28/2024 mild nonobstructive CAD with 30% proximal to mid LAD stenosis  ROS: Pt denies any chest discomfort, jaw pain, arm pain, palpitations, syncope, presyncope, orthopnea, PND, or LE edema.  Studies Reviewed I have independently reviewed the patient's ECG, ***.  Physical Exam VS:  There were no vitals taken for this visit.       Wt Readings from Last 3 Encounters:  10/04/24 258 lb (117 kg)  07/28/24 252 lb (114.3 kg)  07/13/24 248 lb 6.4 oz (112.7 kg)    GEN: No acute distress. NECK: No JVD; No carotid bruits. CARDIAC: ***RRR, no murmurs, rubs, gallops. RESPIRATORY:  Clear to auscultation. EXTREMITIES:  Warm and well-perfused. No edema.  ASSESSMENT AND PLAN Nonobstructive CAD HLD Paroxysmal atrial fibrillation status post PVI ablation 06/2024 Nonischemic cardiomyopathy with recovered ejection fraction        {Are you ordering a CV Procedure (e.g. stress test, cath, DCCV, TEE, etc)?   Press F2        :[id number]}  Dispo:  ***  Signed, Caron Poser, MD

## 2024-10-04 NOTE — Patient Instructions (Addendum)
 Medication Instructions:   Your physician recommends the following medication changes.  STOP TAKING: Amiodarone  Colchicine  Pantoprazole   *If you need a refill on your cardiac medications before your next appointment, please call your pharmacy*  Lab Work:  None ordered at this time   If you have labs (blood work) drawn today and your tests are completely normal, you will receive your results only by:  MyChart Message (if you have MyChart) OR  A paper copy in the mail If you have any lab test that is abnormal or we need to change your treatment, we will call you to review the results.  Testing/Procedures:  None ordered at this time   Referrals:  Your cardiologist has referred you to General Cardiology  We have attached their office location and phone number below.  Please allow them 3-5 business days to reach out to you to make an appointment.  If you have not heard from their office within that time, please call them to schedule your appointment.    Follow-Up:  At [facility], you and your health needs are our priority.  As part of our continuing mission to provide you with exceptional heart care, our providers are all part of one team.  This team includes your primary Cardiologist (physician) and Advanced Practice Providers or APPs (Physician Assistants and Nurse Practitioners) who all work together to provide you with the care you need, when you need it.  Your next appointment:    As needed with Electrophysiology  Schedule appointment with Heart Failure Clinic [facility], NP)    We recommend signing up for the patient portal called MyChart.  Sign up information is provided on this After Visit Summary.  MyChart is used to connect with patients for Virtual Visits (Telemedicine).  Patients are able to view lab/test results, encounter notes, upcoming appointments, etc.  Non-urgent messages can be sent to your provider as well.   To learn more about what you can do  with MyChart, go to [email or web address].

## 2024-10-05 ENCOUNTER — Ambulatory Visit

## 2024-10-14 ENCOUNTER — Telehealth: Payer: Self-pay | Admitting: Family

## 2024-10-14 ENCOUNTER — Other Ambulatory Visit (HOSPITAL_COMMUNITY): Payer: Self-pay | Admitting: Family Medicine

## 2024-10-14 DIAGNOSIS — I5022 Chronic systolic (congestive) heart failure: Secondary | ICD-10-CM

## 2024-10-14 NOTE — Telephone Encounter (Signed)
 Called to confirm/remind patient of their appointment at the Advanced Heart Failure Clinic on 10/15/24.   Appointment:   [x] Confirmed  [] Left mess   [] No answer/No voice mail  [] VM Full/unable to leave message  [] Phone not in service  Patient reminded to bring all medications and/or complete list.  Confirmed patient has transportation. Gave directions, instructed to utilize valet parking.

## 2024-10-15 ENCOUNTER — Encounter: Payer: Self-pay | Admitting: Family

## 2024-10-15 ENCOUNTER — Ambulatory Visit: Attending: Family | Admitting: Family

## 2024-10-15 VITALS — BP 122/76 | HR 81 | Wt 263.8 lb

## 2024-10-15 DIAGNOSIS — I110 Hypertensive heart disease with heart failure: Secondary | ICD-10-CM | POA: Diagnosis not present

## 2024-10-15 DIAGNOSIS — R0683 Snoring: Secondary | ICD-10-CM | POA: Diagnosis not present

## 2024-10-15 DIAGNOSIS — R945 Abnormal results of liver function studies: Secondary | ICD-10-CM | POA: Insufficient documentation

## 2024-10-15 DIAGNOSIS — Z79899 Other long term (current) drug therapy: Secondary | ICD-10-CM | POA: Diagnosis not present

## 2024-10-15 DIAGNOSIS — I480 Paroxysmal atrial fibrillation: Secondary | ICD-10-CM | POA: Diagnosis not present

## 2024-10-15 DIAGNOSIS — I5032 Chronic diastolic (congestive) heart failure: Secondary | ICD-10-CM | POA: Diagnosis not present

## 2024-10-15 DIAGNOSIS — I2510 Atherosclerotic heart disease of native coronary artery without angina pectoris: Secondary | ICD-10-CM

## 2024-10-15 DIAGNOSIS — Z87891 Personal history of nicotine dependence: Secondary | ICD-10-CM | POA: Insufficient documentation

## 2024-10-15 DIAGNOSIS — Z7901 Long term (current) use of anticoagulants: Secondary | ICD-10-CM | POA: Insufficient documentation

## 2024-10-15 DIAGNOSIS — I5020 Unspecified systolic (congestive) heart failure: Secondary | ICD-10-CM | POA: Diagnosis not present

## 2024-10-15 DIAGNOSIS — R7989 Other specified abnormal findings of blood chemistry: Secondary | ICD-10-CM

## 2024-10-15 DIAGNOSIS — I5022 Chronic systolic (congestive) heart failure: Secondary | ICD-10-CM | POA: Diagnosis not present

## 2024-10-15 NOTE — Progress Notes (Signed)
 ADVANCED HF CLINIC NOTE   Primary Care: Duanne Butler DASEN, MD Primary Cardiologist: Annabella Scarce, MD HF Cardiologist: Dr. Rolan  Chief Complaint: shortness of breath  HPI: Jason Fleming is a 63 y.o. male with hx HTN, atrial fibrillation, and newly diagnosed systolic heart failure.   Admitted 6/25 with AF with RVR with HR in 160s. He was started on diltiazem  gtt. This was weaned off and transitioned to metoprolol . Unfortunately he became hypotensive and required fluid bolus, + NE and milrinone  gtts. Ultimately progressed to CGS. Ltd echo showed EF ~30%, RV moderately reduced. Started on amio gtt, underwent TEE showing EF 25-30%, RV mildly down, moderate MR,  (per Dr. Orvilla read). Underwent R/LHC showing right dominant coronaries with minimal CAD in LAD, elevating filling pressures, preserved CI but low PAPi. Gtts weaned off. Underwent TEE/DCCV that was unsuccessful. He was re-loaded with amiodarone  and attempted repeat DCCV 05/01/24 with successful conversion to NSR. Amio weaned to oral, and GDMT started. cMRI showed LVEF 30% with worse function in the septum, RVEF 35%, mod MR. He was discharged home, weight 237 lbs.  Follow up with EP 6/25, arranged for AF ablation.   Found to be in AF with RVR, rates 130's at post hospital AHF follow up. Underwent DCCV to NSR 05/14/24.  Had ablation done 06/29/24.  He presents today for a HF follow-up visit with a chief complaint of minimal shortness of breath but only with heavy exertion. No other complaints and says that since his ablation 3 months ago, he feels great. Works full time on [employer]. He has had a gradual weight gain and took a torsemide  last week thinking it could be fluid. He says that it didn't help so he's realized his weight gain is because he's not as active as it gets dark so early. Denies any overnight weight gain of >2 pounds.    ECG 10/04/24 NSR  Labs (6/25): LDL 26 Labs (7/25): K 4.5, creatinine 1.15 Labs (8/25): K  4.3, creatinine 1.08=>1.33, LDL 61, Hg 12.8  Cardiac Studies - cMRI (6/25): LVEF 30% (worse function in septum), RVEF 35%, moderate MR  - R/LHC (6/25): mild, non-obs CAD in LAD; RA 16, PA 42/23 (30), PCWP 23, CO/CI (Fick) 7.8/3.21, PVR 0.9 WU, PAPi 1.25  - TEE (6/25): EF 25-30%, RV mildly reduced, moderate MR, no clot  - Echo (6/25): EF 25-30%, RV not well visualized, moderate MR  - Echo (10/25): EF 55-60%, normal RV, mild MR  Past Medical History:  Diagnosis Date   Anxiety    Atrial fibrillation (HCC)    Depression    Hypertension    Paroxysmal atrial fibrillation (HCC)    cardioversion in ed 05/2018   Current Outpatient Medications  Medication Sig Dispense Refill   amLODipine  (NORVASC ) 5 MG tablet Take 5 mg by mouth daily.     apixaban  (ELIQUIS ) 5 MG TABS tablet Take 1 tablet (5 mg total) by mouth 2 (two) times daily. 180 tablet 3   dapagliflozin  propanediol (FARXIGA ) 10 MG TABS tablet Take 1 tablet (10 mg total) by mouth daily. 90 tablet 3   digoxin  (LANOXIN ) 0.125 MG tablet Take 1 tablet (0.125 mg total) by mouth daily. 90 tablet 3   metoprolol  succinate (TOPROL  XL) 25 MG 24 hr tablet Take 1 tablet (25 mg total) by mouth at bedtime. 90 tablet 3   Multiple Vitamin (MULTIVITAMIN) tablet Take 1 tablet by mouth daily.     Omega-3 Fatty Acids (FISH OIL PO) Take 1 capsule by mouth  daily.     rosuvastatin  (CRESTOR ) 20 MG tablet Take 1 tablet (20 mg total) by mouth daily. 90 tablet 3   sacubitril -valsartan  (ENTRESTO ) 24-26 MG Take 1 tablet by mouth 2 (two) times daily. 180 tablet 3   spironolactone  (ALDACTONE ) 25 MG tablet Take 0.5 tablets (12.5 mg total) by mouth daily. 45 tablet 3   torsemide  (DEMADEX ) 20 MG tablet TAKE 1 TABLET BY MOUTH DAILY AS  NEEDED 90 tablet 3   venlafaxine  XR (EFFEXOR -XR) 150 MG 24 hr capsule TAKE 1 CAPSULE BY MOUTH DAILY  WITH BREAKFAST (NEED APPT WITH  PCP) 30 capsule 11   No current facility-administered medications for this visit.   No Known  Allergies  Social History   Socioeconomic History   Marital status: Married    Spouse name: Not on file   Number of children: Not on file   Years of education: Not on file   Highest education level: 12th grade  Occupational History   Not on file  Tobacco Use   Smoking status: Former    Current packs/day: 0.00    Types: E-cigarettes, Cigarettes    Quit date: 03/02/2012    Years since quitting: 12.6   Smokeless tobacco: Never  Vaping Use   Vaping status: Former  Substance and Sexual Activity   Alcohol use: Yes    Alcohol/week: 3.0 standard drinks of alcohol    Types: 3 Cans of beer per week   Drug use: No   Sexual activity: Not Currently    Birth control/protection: None  Other Topics Concern   Not on file  Social History Narrative   Not on file   Social Drivers of Health   Financial Resource Strain: Low Risk  (07/12/2024)   Overall Financial Resource Strain (CARDIA)    Difficulty of Paying Living Expenses: Not hard at all  Food Insecurity: No Food Insecurity (07/12/2024)   Hunger Vital Sign    Worried About Running Out of Food in the Last Year: Never true    Ran Out of Food in the Last Year: Never true  Transportation Needs: No Transportation Needs (07/12/2024)   PRAPARE - Administrator, Civil Service (Medical): No    Lack of Transportation (Non-Medical): No  Physical Activity: Inactive (07/12/2024)   Exercise Vital Sign    Days of Exercise per Week: 0 days    Minutes of Exercise per Session: Not on file  Stress: Stress Concern Present (07/12/2024)   [employer] of Occupational Health - Occupational Stress Questionnaire    Feeling of Stress: To some extent  Social Connections: Socially Integrated (07/12/2024)   Social Connection and Isolation Panel    Frequency of Communication with Friends and Family: More than three times a week    Frequency of Social Gatherings with Friends and Family: Once a week    Attends Religious Services: More than 4 times  per year    Active Member of [employer] or Organizations: Yes    Attends Engineer, Structural: More than 4 times per year    Marital Status: Married  Catering Manager Violence: Not At Risk (04/26/2024)   Humiliation, Afraid, Rape, and Kick questionnaire    Fear of Current or Ex-Partner: No    Emotionally Abused: No    Physically Abused: No    Sexually Abused: No   Family History  Problem Relation Age of Onset   Anxiety disorder Mother    Hypertension Mother    Depression Father    Heart disease Father  CAD Father        CABG x 3 in 2000   Stroke Maternal Grandfather    Alcohol abuse Maternal Uncle    Alcohol abuse Paternal Uncle    Colon cancer Neg Hx    Colon polyps Neg Hx    Esophageal cancer Neg Hx    Stomach cancer Neg Hx    Rectal cancer Neg Hx    Vitals:   10/15/24 0902  BP: 122/76  Pulse: 81  SpO2: 98%  Weight: 263 lb 12.8 oz (119.7 kg)   Wt Readings from Last 3 Encounters:  10/15/24 263 lb 12.8 oz (119.7 kg)  10/04/24 258 lb (117 kg)  07/28/24 252 lb (114.3 kg)   Lab Results  Component Value Date   CREATININE 1.33 (H) 07/13/2024   CREATININE 1.08 07/01/2024   CREATININE 1.29 (H) 06/01/2024   PHYSICAL EXAM:  General: Well appearing.  Cor: No JVD. Regular rhythm, rate.  Lungs: clear Abdomen: soft, nontender, nondistended. Extremities: no edema Neuro:. Affect pleasant   ASSESSMENT & PLAN: 1. Atrial fibrillation: 1 prior episode of AF with DCCV.  Admitted 6/25 with AF RVR of uncertain duration. Later developed shock after administration of Dilt/metoprolol . S/p unsuccessful TEE/DCCV 04/29/24, successful DCCV on 05/01/24. Had ERAF with RVR, repeat DCCV 05/14/24 to NSR. Had ablation done 08/25 and has been in NSR since. - No longer on amiodarone  - Continue apixaban  5 mg bid. Denies abnormal bleeding.   2. Chronic HF with now improved EF: Recent admission 6/25 with CGS. Echo (difficult/incomplete study): EF~30%, moderate RV dysfunction, moderate MR. Cause of  cardiomyopathy uncertain. Elevated calcium  score in past in LAD territory, so has some degree of CAD however LHC showed mild non-obs CAD.  ?long-standing unrecognized AF/RVR with tachycardia-mediated CMP. TEE 04/29/24: EF 25-30%, RV mildly down, moderate MR. R/LHC with right dominant coronaries with minimal CAD in LAD, elevated filling pressures, preserved CI but low PAPi. cMRI (6/25): LVEF 30% with worse function in the septum, RVEF 35%, mod MR. Echo (10/25): EF 55-60%, normal RV, mild MR. EF much improved since his ablation.Today, NYHA I. Euvolemic today. BMET today - Continue Toprol  XL 25 mg daily (CI 2.8 on cath). - Continue Farxiga  10 mg daily. - Continue torsemide  20 mg PRN - Stop digoxin  now that EF has recovered and he's maintaining NSR - Continue spironolactone  12.5 mg daily - Continue Entresto  24/26 mg bid - D/C amlodipine  to lessen pill burden. BP today is 122/76. Should BP start to rise, could increase entresto  or spironolactone . 3. Elevated LFTs: Suspected shock liver. LFTs now normalized.     - Continue Crestor  20 mg daily. Lipids 08/25 normal. LDL 61 4. CAD: mild, non-obstructive disease in LAD by LHC. No chest pain. - No ASA with Eliquis . 5. Snoring: PCP has referred for sleep study.   Return in 3 months, sooner if needed. MD appointment after that.   I spent 39 minutes reviewing records, interviewing/ examing patient and managing plan/ orders.   Ellouise DELENA Class FNP-BC 10/15/24

## 2024-10-15 NOTE — Patient Instructions (Signed)
 Medication Changes:  STOP amlodipine   STOP Digoxin   Lab Work:   Go downstairs to [facility] on [address] to have your blood work completed.  We will only call you if the results are abnormal or if the provider would like to make medication changes.  No news is good news.    Follow-Up in: Please follow up with the Advanced Heart Failure Clinic in 3 months with Jason Class, FNP.   Thank you for choosing [facility] Failure [facility].    At the Advanced Heart Failure Clinic, you and your health needs are our priority. We have a designated team specialized in the treatment of Heart Failure. This Care Team includes your primary Heart Failure Specialized Cardiologist (physician), Advanced Practice Providers (APPs- Physician Assistants and Nurse Practitioners), and Pharmacist who all work together to provide you with the care you need, when you need it.   You may see any of the following providers on your designated Care Team at your next follow up:  Dr. Toribio Fuel Dr. Ezra Shuck Dr. Ria Commander Dr. Morene Brownie Jason Class, FNP Jaun Bash, RPH-CPP  Please be sure to bring in all your medications bottles to every appointment.   Need to Contact Us :  If you have any questions or concerns before your next appointment please send us  a message through [place] or call our office at [phone].    TO LEAVE A MESSAGE FOR THE NURSE SELECT OPTION 2, PLEASE LEAVE A MESSAGE INCLUDING: YOUR NAME DATE OF BIRTH CALL BACK NUMBER REASON FOR CALL**this is important as we prioritize the call backs  YOU WILL RECEIVE A CALL BACK THE SAME DAY AS LONG AS YOU CALL BEFORE 4:00 PM

## 2024-10-16 LAB — BASIC METABOLIC PANEL WITH GFR
BUN/Creatinine Ratio: 12 (ref 10–24)
BUN: 11 mg/dL (ref 8–27)
CO2: 23 mmol/L (ref 20–29)
Calcium: 9.2 mg/dL (ref 8.6–10.2)
Chloride: 105 mmol/L (ref 96–106)
Creatinine, Ser: 0.95 mg/dL (ref 0.76–1.27)
Glucose: 120 mg/dL — ABNORMAL HIGH (ref 70–99)
Potassium: 3.7 mmol/L (ref 3.5–5.2)
Sodium: 142 mmol/L (ref 134–144)
eGFR: 90 mL/min/1.73 (ref >59)

## 2024-10-17 ENCOUNTER — Ambulatory Visit: Payer: Self-pay | Admitting: Family

## 2024-11-30 ENCOUNTER — Ambulatory Visit

## 2024-11-30 VITALS — BP 106/68 | HR 70 | Ht 74.0 in | Wt 255.0 lb

## 2024-11-30 DIAGNOSIS — I5020 Unspecified systolic (congestive) heart failure: Secondary | ICD-10-CM

## 2024-11-30 DIAGNOSIS — I428 Other cardiomyopathies: Secondary | ICD-10-CM

## 2024-11-30 DIAGNOSIS — I480 Paroxysmal atrial fibrillation: Secondary | ICD-10-CM | POA: Diagnosis not present

## 2024-11-30 DIAGNOSIS — E782 Mixed hyperlipidemia: Secondary | ICD-10-CM | POA: Diagnosis not present

## 2024-11-30 NOTE — Patient Instructions (Signed)
 Medication Instructions:  Your physician recommends that you continue on your current medications as directed. Please refer to the Current Medication list given to you today.  *If you need a refill on your cardiac medications before your next appointment, please call your pharmacy*  Lab Work:  No labs ordered today   If you have labs (blood work) drawn today and your tests are completely normal, you will receive your results only by: MyChart Message (if you have MyChart) OR A paper copy in the mail If you have any lab test that is abnormal or we need to change your treatment, we will call you to review the results.  Testing/Procedures:  No test ordered today  Echocardiogram in 6 months prior to follow up appointment   Follow-Up: At [facility], you and your health needs are our priority.  As part of our continuing mission to provide you with exceptional heart care, our providers are all part of one team.  This team includes your primary Cardiologist (physician) and Advanced Practice Providers or APPs (Physician Assistants and Nurse Practitioners) who all work together to provide you with the care you need, when you need it.  Your next appointment:    6 month(s)  Provider:    Caron Poser, MD   We recommend signing up for the patient portal called MyChart.  Sign up information is provided on this After Visit Summary.  MyChart is used to connect with patients for Virtual Visits (Telemedicine).  Patients are able to view lab/test results, encounter notes, upcoming appointments, etc.  Non-urgent messages can be sent to your provider as well.   To learn more about what you can do with MyChart, go to [email or web address].

## 2024-11-30 NOTE — Progress Notes (Signed)
 " Cardiology Office Note   Date:  11/30/2024  ID:  Jason Fleming, DOB [birth year], MRN [id number] PCP: Duanne Butler DASEN, MD  [facility] Providers Cardiologist:  Caron Poser, MD Cardiology APP:  Percy Rosaline HERO, NP  Electrophysiologist:  OLE DASEN HOLTS, MD  Electrophysiology APP:  Riddle, Suzann, NP   History of Present Illness Jason Fleming is a 64 y.o. male PMH NICM with recovered EF, paroxysmal atrial fibrillation status post PVI ablation 06/2024, nonobstructive CAD, HLD who presents to establish care.  Patient reports he is overall doing and feeling well.  He denies any symptoms suggestive of A-fib recurrence.  He says he would like to get off some of the heart failure medicines if possible.  He denies any obvious adverse side effects with medications.  We discussed that is sometimes difficult to stop heart failure medications since they often help to improve the ejection fraction, although his reduction in EF is most likely due to atrial fibrillation.  Last LDL 61 06/2024.  Relevant CVD History - TTE 08/2024 LVEF 55 to 60%, normal RV size and function, mild MR - PVI ablation 06/2024 - DCCV 04/2024 - CMR 04/2024 mildly dilated LV with EF 30%, normal RV size and function, moderate MR, no significant LGE reported - TEE 04/2024 LVEF 25 to 30% with global hypokinesis, moderate MR - Cath 04/2024 mild nonobstructive CAD   ROS: Pt denies any chest discomfort, jaw pain, arm pain, palpitations, syncope, presyncope, orthopnea, PND, or LE edema.  Studies Reviewed I have independently reviewed the patient's ECG, previous medical records, previous cardiac testing, previous blood work.  Physical Exam VS:  BP 106/68 (BP Location: Left Arm, Patient Position: Sitting, Cuff Size: Large)   Pulse 70   Ht 6' 2 (1.88 m)   Wt 255 lb (115.7 kg)   SpO2 97%   BMI 32.74 kg/m        Wt Readings from Last 3 Encounters:  11/30/24 255 lb (115.7 kg)  10/15/24 263 lb 12.8 oz (119.7 kg)   10/04/24 258 lb (117 kg)    GEN: No acute distress. NECK: No JVD; No carotid bruits. CARDIAC: RRR, no murmurs, rubs, gallops. RESPIRATORY:  Clear to auscultation. EXTREMITIES:  Warm and well-perfused. No edema.  ASSESSMENT AND PLAN Paroxysmal atrial fibrillation status post PVI ablation 06/2024 Remains in sinus rhythm.  Doing well.  CHA2DS2-VASc of 2.  Plan: - Continue Eliquis  5 mg twice daily - Continue metoprolol  succinate 25 mg daily  NICM with recovered EF Most likely due to longstanding persistent atrial fibrillation which has now resolved after his ablation.  NYHA I.  He would like to get off of some of his heart failure medications.  We discussed that although his EF most likely recovered due to treatment of his atrial fibrillation, it is difficult to know what impact the GDMT had, therefore we typically continue treatment unless side effects are present.  Plan: - Continue metoprolol  XL 25 mg daily, Entresto  24-26 mg twice daily, dapagliflozin  10 mg daily, spironolactone  12.5 mg daily - Euvolemic, as needed torsemide  - Will plan to repeat echo prior to next visit; if his EF is still preserved and he is still feeling well without A-fib recurrence, we can discuss peeling off some of his GDMT though I recommended we keep the medications going if they are not causing issues  Nonobstructive CAD HLD Nonobstructive CAD on cath 04/2024.  Last LDL 61 06/2024.  LDL goal less than 70.  Continue Crestor  20 mg daily  Dispo: RTC 6 months or sooner as needed  Signed, Caron Poser, MD  "

## 2024-12-06 ENCOUNTER — Ambulatory Visit

## 2025-01-11 ENCOUNTER — Ambulatory Visit: Admitting: Family

## 2025-01-13 ENCOUNTER — Ambulatory Visit: Admitting: Family
